# Patient Record
Sex: Male | Born: 1935 | Race: Black or African American | Hispanic: No | Marital: Married | Smoking: Former smoker
Health system: Southern US, Community
[De-identification: ages and names within clinical notes are randomized; demographics above are authoritative.]

## PROBLEM LIST (undated history)

## (undated) DIAGNOSIS — G4733 Obstructive sleep apnea (adult) (pediatric): Secondary | ICD-10-CM

## (undated) DIAGNOSIS — R05 Cough: Secondary | ICD-10-CM

## (undated) DIAGNOSIS — I10 Essential (primary) hypertension: Secondary | ICD-10-CM

## (undated) DIAGNOSIS — E785 Hyperlipidemia, unspecified: Secondary | ICD-10-CM

## (undated) DIAGNOSIS — R059 Cough, unspecified: Secondary | ICD-10-CM

## (undated) DIAGNOSIS — M1990 Unspecified osteoarthritis, unspecified site: Secondary | ICD-10-CM

## (undated) DIAGNOSIS — D649 Anemia, unspecified: Secondary | ICD-10-CM

## (undated) DIAGNOSIS — R600 Localized edema: Secondary | ICD-10-CM

## (undated) HISTORY — DX: Cough: R05

## (undated) HISTORY — DX: Hyperlipidemia, unspecified: E78.5

## (undated) HISTORY — DX: Anemia, unspecified: D64.9

## (undated) HISTORY — PX: COLONOSCOPY W/ POLYPECTOMY: SHX1380

## (undated) HISTORY — PX: OTHER SURGICAL HISTORY: SHX169

## (undated) HISTORY — DX: Obstructive sleep apnea (adult) (pediatric): G47.33

## (undated) HISTORY — DX: Cough, unspecified: R05.9

---

## 2002-02-22 ENCOUNTER — Emergency Department (HOSPITAL_COMMUNITY): Admission: EM | Admit: 2002-02-22 | Discharge: 2002-02-22 | Payer: Self-pay | Admitting: Emergency Medicine

## 2002-02-22 ENCOUNTER — Encounter: Payer: Self-pay | Admitting: Emergency Medicine

## 2002-05-03 ENCOUNTER — Encounter: Payer: Self-pay | Admitting: Internal Medicine

## 2002-07-15 ENCOUNTER — Encounter: Payer: Self-pay | Admitting: Internal Medicine

## 2004-07-06 ENCOUNTER — Ambulatory Visit: Payer: Self-pay | Admitting: Internal Medicine

## 2004-07-13 ENCOUNTER — Ambulatory Visit: Payer: Self-pay | Admitting: Internal Medicine

## 2004-07-19 ENCOUNTER — Ambulatory Visit: Payer: Self-pay | Admitting: Internal Medicine

## 2004-07-26 ENCOUNTER — Ambulatory Visit: Payer: Self-pay | Admitting: Internal Medicine

## 2004-08-03 ENCOUNTER — Ambulatory Visit: Payer: Self-pay | Admitting: Internal Medicine

## 2004-08-09 ENCOUNTER — Ambulatory Visit: Payer: Self-pay | Admitting: Internal Medicine

## 2004-08-23 ENCOUNTER — Ambulatory Visit: Payer: Self-pay | Admitting: Internal Medicine

## 2004-08-30 ENCOUNTER — Ambulatory Visit: Payer: Self-pay | Admitting: Internal Medicine

## 2004-09-07 ENCOUNTER — Ambulatory Visit: Payer: Self-pay | Admitting: Internal Medicine

## 2004-09-10 ENCOUNTER — Ambulatory Visit: Payer: Self-pay | Admitting: Internal Medicine

## 2004-09-21 ENCOUNTER — Ambulatory Visit: Payer: Self-pay | Admitting: Internal Medicine

## 2004-09-25 ENCOUNTER — Ambulatory Visit: Payer: Self-pay | Admitting: Internal Medicine

## 2004-10-04 ENCOUNTER — Ambulatory Visit: Payer: Self-pay | Admitting: Internal Medicine

## 2004-10-15 ENCOUNTER — Ambulatory Visit: Payer: Self-pay | Admitting: Internal Medicine

## 2004-10-23 ENCOUNTER — Ambulatory Visit: Payer: Self-pay | Admitting: Internal Medicine

## 2004-10-30 ENCOUNTER — Ambulatory Visit: Payer: Self-pay | Admitting: Internal Medicine

## 2004-11-08 ENCOUNTER — Ambulatory Visit: Payer: Self-pay | Admitting: Internal Medicine

## 2004-11-13 ENCOUNTER — Ambulatory Visit: Payer: Self-pay | Admitting: Internal Medicine

## 2004-11-23 ENCOUNTER — Ambulatory Visit: Payer: Self-pay | Admitting: Internal Medicine

## 2004-11-30 ENCOUNTER — Ambulatory Visit: Payer: Self-pay | Admitting: Internal Medicine

## 2004-12-07 ENCOUNTER — Ambulatory Visit: Payer: Self-pay | Admitting: Internal Medicine

## 2004-12-13 ENCOUNTER — Ambulatory Visit: Payer: Self-pay | Admitting: Internal Medicine

## 2004-12-21 ENCOUNTER — Ambulatory Visit: Payer: Self-pay | Admitting: Internal Medicine

## 2004-12-26 ENCOUNTER — Ambulatory Visit: Payer: Self-pay | Admitting: Internal Medicine

## 2005-01-02 ENCOUNTER — Ambulatory Visit: Payer: Self-pay | Admitting: Internal Medicine

## 2005-01-10 ENCOUNTER — Ambulatory Visit: Payer: Self-pay | Admitting: Internal Medicine

## 2005-01-16 ENCOUNTER — Ambulatory Visit: Payer: Self-pay | Admitting: Internal Medicine

## 2005-01-22 ENCOUNTER — Ambulatory Visit: Payer: Self-pay | Admitting: Internal Medicine

## 2005-01-29 ENCOUNTER — Ambulatory Visit: Payer: Self-pay | Admitting: Internal Medicine

## 2005-02-01 ENCOUNTER — Ambulatory Visit: Payer: Self-pay | Admitting: Internal Medicine

## 2005-02-07 ENCOUNTER — Ambulatory Visit: Payer: Self-pay | Admitting: Internal Medicine

## 2005-02-13 ENCOUNTER — Ambulatory Visit: Payer: Self-pay | Admitting: Internal Medicine

## 2005-02-21 ENCOUNTER — Ambulatory Visit: Payer: Self-pay | Admitting: Internal Medicine

## 2005-02-21 ENCOUNTER — Emergency Department (HOSPITAL_COMMUNITY): Admission: EM | Admit: 2005-02-21 | Discharge: 2005-02-22 | Payer: Self-pay | Admitting: *Deleted

## 2005-03-01 ENCOUNTER — Ambulatory Visit: Payer: Self-pay | Admitting: Internal Medicine

## 2005-03-08 ENCOUNTER — Ambulatory Visit: Payer: Self-pay | Admitting: Internal Medicine

## 2005-03-15 ENCOUNTER — Ambulatory Visit: Payer: Self-pay | Admitting: Internal Medicine

## 2005-03-26 ENCOUNTER — Ambulatory Visit: Payer: Self-pay | Admitting: Internal Medicine

## 2005-04-09 ENCOUNTER — Ambulatory Visit: Payer: Self-pay | Admitting: Internal Medicine

## 2005-04-18 ENCOUNTER — Ambulatory Visit: Payer: Self-pay | Admitting: Internal Medicine

## 2005-05-02 ENCOUNTER — Ambulatory Visit: Payer: Self-pay | Admitting: Internal Medicine

## 2005-05-14 ENCOUNTER — Ambulatory Visit: Payer: Self-pay | Admitting: Internal Medicine

## 2005-05-30 ENCOUNTER — Ambulatory Visit: Payer: Self-pay | Admitting: Internal Medicine

## 2005-06-04 ENCOUNTER — Ambulatory Visit: Payer: Self-pay | Admitting: Internal Medicine

## 2005-06-14 ENCOUNTER — Ambulatory Visit: Payer: Self-pay | Admitting: Internal Medicine

## 2005-06-20 ENCOUNTER — Ambulatory Visit: Payer: Self-pay | Admitting: Internal Medicine

## 2005-06-26 ENCOUNTER — Ambulatory Visit: Payer: Self-pay | Admitting: Internal Medicine

## 2005-06-27 ENCOUNTER — Ambulatory Visit: Payer: Self-pay | Admitting: Internal Medicine

## 2005-07-05 ENCOUNTER — Ambulatory Visit: Payer: Self-pay | Admitting: Internal Medicine

## 2005-07-12 ENCOUNTER — Ambulatory Visit: Payer: Self-pay | Admitting: Internal Medicine

## 2005-07-22 ENCOUNTER — Ambulatory Visit: Payer: Self-pay | Admitting: Internal Medicine

## 2005-07-30 ENCOUNTER — Ambulatory Visit: Payer: Self-pay | Admitting: Internal Medicine

## 2005-07-31 ENCOUNTER — Ambulatory Visit: Payer: Self-pay | Admitting: Internal Medicine

## 2005-08-06 DIAGNOSIS — Z86010 Personal history of colonic polyps: Secondary | ICD-10-CM | POA: Insufficient documentation

## 2005-08-09 ENCOUNTER — Ambulatory Visit: Payer: Self-pay | Admitting: Internal Medicine

## 2005-08-16 ENCOUNTER — Ambulatory Visit: Payer: Self-pay | Admitting: Internal Medicine

## 2005-08-27 ENCOUNTER — Ambulatory Visit: Payer: Self-pay | Admitting: Internal Medicine

## 2005-09-05 ENCOUNTER — Ambulatory Visit: Payer: Self-pay | Admitting: Internal Medicine

## 2005-09-18 ENCOUNTER — Ambulatory Visit: Payer: Self-pay | Admitting: Internal Medicine

## 2005-09-27 ENCOUNTER — Ambulatory Visit: Payer: Self-pay | Admitting: Internal Medicine

## 2005-10-01 ENCOUNTER — Ambulatory Visit: Payer: Self-pay | Admitting: Internal Medicine

## 2005-10-10 ENCOUNTER — Ambulatory Visit: Payer: Self-pay | Admitting: Internal Medicine

## 2005-10-14 ENCOUNTER — Ambulatory Visit: Payer: Self-pay | Admitting: Internal Medicine

## 2005-10-25 ENCOUNTER — Ambulatory Visit: Payer: Self-pay | Admitting: Internal Medicine

## 2005-10-31 ENCOUNTER — Ambulatory Visit: Payer: Self-pay | Admitting: Internal Medicine

## 2005-11-14 ENCOUNTER — Ambulatory Visit: Payer: Self-pay | Admitting: Internal Medicine

## 2005-11-21 ENCOUNTER — Ambulatory Visit: Payer: Self-pay | Admitting: Internal Medicine

## 2005-11-26 ENCOUNTER — Ambulatory Visit: Payer: Self-pay | Admitting: Internal Medicine

## 2005-11-27 ENCOUNTER — Ambulatory Visit: Payer: Self-pay | Admitting: Internal Medicine

## 2005-12-02 ENCOUNTER — Ambulatory Visit: Payer: Self-pay | Admitting: Internal Medicine

## 2005-12-13 ENCOUNTER — Ambulatory Visit: Payer: Self-pay | Admitting: Internal Medicine

## 2005-12-19 ENCOUNTER — Ambulatory Visit: Payer: Self-pay | Admitting: Internal Medicine

## 2005-12-27 ENCOUNTER — Ambulatory Visit: Payer: Self-pay | Admitting: Internal Medicine

## 2005-12-30 ENCOUNTER — Ambulatory Visit: Payer: Self-pay | Admitting: Internal Medicine

## 2006-01-08 ENCOUNTER — Ambulatory Visit: Payer: Self-pay | Admitting: Internal Medicine

## 2006-01-14 ENCOUNTER — Ambulatory Visit: Payer: Self-pay | Admitting: Internal Medicine

## 2006-01-24 ENCOUNTER — Ambulatory Visit: Payer: Self-pay | Admitting: Internal Medicine

## 2006-01-30 ENCOUNTER — Ambulatory Visit: Payer: Self-pay | Admitting: Internal Medicine

## 2006-02-06 ENCOUNTER — Ambulatory Visit: Payer: Self-pay | Admitting: Internal Medicine

## 2006-02-13 ENCOUNTER — Ambulatory Visit: Payer: Self-pay | Admitting: Internal Medicine

## 2006-02-20 ENCOUNTER — Ambulatory Visit: Payer: Self-pay | Admitting: Internal Medicine

## 2006-02-27 ENCOUNTER — Ambulatory Visit: Payer: Self-pay | Admitting: Internal Medicine

## 2006-03-04 ENCOUNTER — Ambulatory Visit: Payer: Self-pay | Admitting: Internal Medicine

## 2006-03-20 ENCOUNTER — Ambulatory Visit: Payer: Self-pay | Admitting: Internal Medicine

## 2006-03-28 ENCOUNTER — Ambulatory Visit: Payer: Self-pay | Admitting: Internal Medicine

## 2006-04-10 ENCOUNTER — Ambulatory Visit: Payer: Self-pay | Admitting: Internal Medicine

## 2006-04-11 ENCOUNTER — Ambulatory Visit: Payer: Self-pay | Admitting: Internal Medicine

## 2006-04-17 ENCOUNTER — Ambulatory Visit: Payer: Self-pay | Admitting: Internal Medicine

## 2006-04-23 ENCOUNTER — Ambulatory Visit: Payer: Self-pay | Admitting: Pulmonary Disease

## 2006-04-23 ENCOUNTER — Ambulatory Visit: Payer: Self-pay | Admitting: Internal Medicine

## 2006-05-07 ENCOUNTER — Ambulatory Visit: Payer: Self-pay | Admitting: Internal Medicine

## 2006-05-13 ENCOUNTER — Ambulatory Visit: Payer: Self-pay | Admitting: Internal Medicine

## 2006-05-29 ENCOUNTER — Ambulatory Visit: Payer: Self-pay | Admitting: Internal Medicine

## 2006-06-04 ENCOUNTER — Ambulatory Visit: Payer: Self-pay | Admitting: Internal Medicine

## 2006-06-18 ENCOUNTER — Ambulatory Visit: Payer: Self-pay | Admitting: Internal Medicine

## 2006-07-04 ENCOUNTER — Ambulatory Visit: Payer: Self-pay | Admitting: Internal Medicine

## 2006-07-11 ENCOUNTER — Ambulatory Visit: Payer: Self-pay | Admitting: Internal Medicine

## 2006-07-25 ENCOUNTER — Ambulatory Visit: Payer: Self-pay | Admitting: Internal Medicine

## 2006-07-31 ENCOUNTER — Ambulatory Visit: Payer: Self-pay | Admitting: Internal Medicine

## 2006-08-07 ENCOUNTER — Ambulatory Visit: Payer: Self-pay | Admitting: Internal Medicine

## 2006-08-14 ENCOUNTER — Ambulatory Visit: Payer: Self-pay | Admitting: Internal Medicine

## 2006-08-21 ENCOUNTER — Ambulatory Visit: Payer: Self-pay | Admitting: Internal Medicine

## 2006-09-01 ENCOUNTER — Ambulatory Visit: Payer: Self-pay | Admitting: Internal Medicine

## 2006-09-08 ENCOUNTER — Ambulatory Visit: Payer: Self-pay | Admitting: Internal Medicine

## 2006-09-25 ENCOUNTER — Ambulatory Visit: Payer: Self-pay | Admitting: Internal Medicine

## 2006-09-30 ENCOUNTER — Ambulatory Visit: Payer: Self-pay | Admitting: Internal Medicine

## 2006-10-01 ENCOUNTER — Ambulatory Visit: Payer: Self-pay | Admitting: Internal Medicine

## 2006-10-09 ENCOUNTER — Ambulatory Visit: Payer: Self-pay | Admitting: Internal Medicine

## 2006-10-16 ENCOUNTER — Ambulatory Visit: Payer: Self-pay | Admitting: Internal Medicine

## 2006-10-22 ENCOUNTER — Encounter: Admission: RE | Admit: 2006-10-22 | Discharge: 2006-10-22 | Payer: Self-pay | Admitting: Internal Medicine

## 2006-10-22 ENCOUNTER — Ambulatory Visit: Payer: Self-pay | Admitting: Internal Medicine

## 2006-10-29 ENCOUNTER — Ambulatory Visit: Payer: Self-pay | Admitting: Internal Medicine

## 2006-11-05 ENCOUNTER — Ambulatory Visit: Payer: Self-pay | Admitting: Internal Medicine

## 2006-11-12 ENCOUNTER — Ambulatory Visit: Payer: Self-pay | Admitting: Internal Medicine

## 2006-11-18 ENCOUNTER — Ambulatory Visit: Payer: Self-pay | Admitting: Internal Medicine

## 2006-11-27 ENCOUNTER — Ambulatory Visit: Payer: Self-pay | Admitting: Internal Medicine

## 2006-12-04 ENCOUNTER — Ambulatory Visit: Payer: Self-pay | Admitting: Internal Medicine

## 2006-12-15 ENCOUNTER — Ambulatory Visit: Payer: Self-pay | Admitting: Internal Medicine

## 2006-12-24 ENCOUNTER — Ambulatory Visit: Payer: Self-pay | Admitting: Internal Medicine

## 2007-01-06 ENCOUNTER — Ambulatory Visit: Payer: Self-pay | Admitting: Internal Medicine

## 2007-01-12 ENCOUNTER — Ambulatory Visit: Payer: Self-pay | Admitting: Internal Medicine

## 2007-01-21 ENCOUNTER — Ambulatory Visit: Payer: Self-pay | Admitting: Internal Medicine

## 2007-01-26 ENCOUNTER — Ambulatory Visit: Payer: Self-pay | Admitting: Internal Medicine

## 2007-02-04 ENCOUNTER — Ambulatory Visit: Payer: Self-pay | Admitting: Internal Medicine

## 2007-02-18 ENCOUNTER — Ambulatory Visit: Payer: Self-pay | Admitting: Internal Medicine

## 2007-02-25 ENCOUNTER — Ambulatory Visit: Payer: Self-pay | Admitting: Internal Medicine

## 2007-02-26 ENCOUNTER — Ambulatory Visit: Payer: Self-pay | Admitting: Internal Medicine

## 2007-03-06 ENCOUNTER — Ambulatory Visit: Payer: Self-pay | Admitting: Internal Medicine

## 2007-03-18 ENCOUNTER — Ambulatory Visit: Payer: Self-pay | Admitting: Internal Medicine

## 2007-03-25 ENCOUNTER — Ambulatory Visit: Payer: Self-pay | Admitting: Internal Medicine

## 2007-04-03 ENCOUNTER — Ambulatory Visit: Payer: Self-pay | Admitting: Internal Medicine

## 2007-04-13 ENCOUNTER — Ambulatory Visit: Payer: Self-pay | Admitting: Internal Medicine

## 2007-04-16 ENCOUNTER — Ambulatory Visit: Payer: Self-pay | Admitting: Pulmonary Disease

## 2007-04-16 DIAGNOSIS — J309 Allergic rhinitis, unspecified: Secondary | ICD-10-CM | POA: Insufficient documentation

## 2007-04-16 DIAGNOSIS — G4733 Obstructive sleep apnea (adult) (pediatric): Secondary | ICD-10-CM

## 2007-04-16 DIAGNOSIS — R05 Cough: Secondary | ICD-10-CM

## 2007-04-16 DIAGNOSIS — J45998 Other asthma: Secondary | ICD-10-CM | POA: Insufficient documentation

## 2007-04-23 ENCOUNTER — Ambulatory Visit: Payer: Self-pay | Admitting: Internal Medicine

## 2007-04-27 ENCOUNTER — Ambulatory Visit: Payer: Self-pay | Admitting: Internal Medicine

## 2007-05-13 ENCOUNTER — Ambulatory Visit: Payer: Self-pay | Admitting: Internal Medicine

## 2007-05-20 ENCOUNTER — Ambulatory Visit: Payer: Self-pay | Admitting: Internal Medicine

## 2007-06-03 ENCOUNTER — Ambulatory Visit: Payer: Self-pay | Admitting: Internal Medicine

## 2007-06-16 ENCOUNTER — Ambulatory Visit: Payer: Self-pay | Admitting: Internal Medicine

## 2007-06-23 ENCOUNTER — Ambulatory Visit: Payer: Self-pay | Admitting: Internal Medicine

## 2007-07-09 ENCOUNTER — Ambulatory Visit: Payer: Self-pay | Admitting: Internal Medicine

## 2007-07-16 ENCOUNTER — Ambulatory Visit: Payer: Self-pay | Admitting: Internal Medicine

## 2007-07-24 ENCOUNTER — Ambulatory Visit: Payer: Self-pay | Admitting: Internal Medicine

## 2007-08-07 ENCOUNTER — Ambulatory Visit: Payer: Self-pay | Admitting: Internal Medicine

## 2007-08-20 ENCOUNTER — Ambulatory Visit: Payer: Self-pay | Admitting: Internal Medicine

## 2007-09-01 ENCOUNTER — Ambulatory Visit: Payer: Self-pay | Admitting: Internal Medicine

## 2007-09-04 ENCOUNTER — Ambulatory Visit: Payer: Self-pay | Admitting: Internal Medicine

## 2007-09-18 ENCOUNTER — Ambulatory Visit: Payer: Self-pay | Admitting: Internal Medicine

## 2007-10-02 ENCOUNTER — Ambulatory Visit: Payer: Self-pay | Admitting: Internal Medicine

## 2007-10-13 ENCOUNTER — Ambulatory Visit: Payer: Self-pay | Admitting: Internal Medicine

## 2007-10-28 ENCOUNTER — Ambulatory Visit: Payer: Self-pay | Admitting: Internal Medicine

## 2007-11-05 ENCOUNTER — Ambulatory Visit: Payer: Self-pay | Admitting: Internal Medicine

## 2007-11-30 ENCOUNTER — Ambulatory Visit: Payer: Self-pay | Admitting: Internal Medicine

## 2007-12-16 ENCOUNTER — Ambulatory Visit: Payer: Self-pay | Admitting: Internal Medicine

## 2007-12-22 ENCOUNTER — Ambulatory Visit: Payer: Self-pay | Admitting: Internal Medicine

## 2008-01-05 ENCOUNTER — Ambulatory Visit: Payer: Self-pay | Admitting: Internal Medicine

## 2008-01-12 ENCOUNTER — Ambulatory Visit: Payer: Self-pay | Admitting: Internal Medicine

## 2008-01-21 ENCOUNTER — Ambulatory Visit: Payer: Self-pay | Admitting: Internal Medicine

## 2008-01-29 ENCOUNTER — Ambulatory Visit: Payer: Self-pay | Admitting: Internal Medicine

## 2008-02-08 ENCOUNTER — Ambulatory Visit: Payer: Self-pay | Admitting: Internal Medicine

## 2008-02-16 ENCOUNTER — Ambulatory Visit: Payer: Self-pay | Admitting: Internal Medicine

## 2008-02-25 ENCOUNTER — Ambulatory Visit: Payer: Self-pay | Admitting: Internal Medicine

## 2008-03-01 ENCOUNTER — Ambulatory Visit: Payer: Self-pay | Admitting: Internal Medicine

## 2008-03-02 ENCOUNTER — Ambulatory Visit: Payer: Self-pay | Admitting: Internal Medicine

## 2008-03-11 ENCOUNTER — Ambulatory Visit: Payer: Self-pay | Admitting: Internal Medicine

## 2008-03-16 ENCOUNTER — Ambulatory Visit: Payer: Self-pay | Admitting: Internal Medicine

## 2008-03-17 ENCOUNTER — Telehealth (INDEPENDENT_AMBULATORY_CARE_PROVIDER_SITE_OTHER): Payer: Self-pay | Admitting: *Deleted

## 2008-03-23 ENCOUNTER — Ambulatory Visit: Payer: Self-pay | Admitting: Internal Medicine

## 2008-03-31 ENCOUNTER — Ambulatory Visit: Payer: Self-pay | Admitting: Pulmonary Disease

## 2008-03-31 ENCOUNTER — Ambulatory Visit: Payer: Self-pay | Admitting: Internal Medicine

## 2008-04-11 ENCOUNTER — Encounter: Payer: Self-pay | Admitting: Internal Medicine

## 2008-04-12 ENCOUNTER — Ambulatory Visit: Payer: Self-pay | Admitting: Internal Medicine

## 2008-04-27 ENCOUNTER — Ambulatory Visit: Payer: Self-pay | Admitting: Internal Medicine

## 2008-05-03 ENCOUNTER — Ambulatory Visit: Payer: Self-pay | Admitting: Internal Medicine

## 2008-05-24 ENCOUNTER — Ambulatory Visit: Payer: Self-pay | Admitting: Internal Medicine

## 2008-05-31 ENCOUNTER — Ambulatory Visit: Payer: Self-pay | Admitting: Internal Medicine

## 2008-06-09 ENCOUNTER — Ambulatory Visit: Payer: Self-pay | Admitting: Internal Medicine

## 2008-06-16 ENCOUNTER — Ambulatory Visit: Payer: Self-pay | Admitting: Internal Medicine

## 2008-06-29 ENCOUNTER — Ambulatory Visit: Payer: Self-pay | Admitting: Internal Medicine

## 2008-07-07 ENCOUNTER — Ambulatory Visit: Payer: Self-pay | Admitting: Internal Medicine

## 2008-07-14 ENCOUNTER — Ambulatory Visit: Payer: Self-pay | Admitting: Internal Medicine

## 2008-07-21 ENCOUNTER — Ambulatory Visit: Payer: Self-pay | Admitting: Internal Medicine

## 2008-08-10 ENCOUNTER — Ambulatory Visit: Payer: Self-pay | Admitting: Internal Medicine

## 2008-08-11 ENCOUNTER — Ambulatory Visit: Payer: Self-pay | Admitting: Internal Medicine

## 2008-08-18 ENCOUNTER — Ambulatory Visit: Payer: Self-pay | Admitting: Internal Medicine

## 2008-08-19 ENCOUNTER — Encounter: Payer: Self-pay | Admitting: Internal Medicine

## 2008-09-02 ENCOUNTER — Ambulatory Visit: Payer: Self-pay | Admitting: Internal Medicine

## 2008-09-14 ENCOUNTER — Ambulatory Visit: Payer: Self-pay | Admitting: Internal Medicine

## 2008-09-16 ENCOUNTER — Ambulatory Visit: Payer: Self-pay | Admitting: Internal Medicine

## 2008-09-21 ENCOUNTER — Ambulatory Visit: Payer: Self-pay | Admitting: Internal Medicine

## 2008-09-28 ENCOUNTER — Ambulatory Visit: Payer: Self-pay | Admitting: Internal Medicine

## 2008-10-06 ENCOUNTER — Ambulatory Visit: Payer: Self-pay | Admitting: Internal Medicine

## 2008-10-10 ENCOUNTER — Ambulatory Visit: Payer: Self-pay | Admitting: Internal Medicine

## 2008-10-25 ENCOUNTER — Ambulatory Visit: Payer: Self-pay | Admitting: Internal Medicine

## 2008-11-03 ENCOUNTER — Ambulatory Visit: Payer: Self-pay | Admitting: Internal Medicine

## 2008-11-03 ENCOUNTER — Telehealth (INDEPENDENT_AMBULATORY_CARE_PROVIDER_SITE_OTHER): Payer: Self-pay | Admitting: *Deleted

## 2008-11-09 ENCOUNTER — Encounter: Payer: Self-pay | Admitting: Internal Medicine

## 2008-11-11 ENCOUNTER — Ambulatory Visit: Payer: Self-pay | Admitting: Internal Medicine

## 2008-11-18 ENCOUNTER — Ambulatory Visit: Payer: Self-pay | Admitting: Internal Medicine

## 2008-11-24 ENCOUNTER — Ambulatory Visit: Payer: Self-pay | Admitting: Internal Medicine

## 2008-12-01 ENCOUNTER — Ambulatory Visit: Payer: Self-pay | Admitting: Internal Medicine

## 2008-12-07 ENCOUNTER — Ambulatory Visit: Payer: Self-pay | Admitting: Internal Medicine

## 2008-12-13 ENCOUNTER — Ambulatory Visit: Payer: Self-pay | Admitting: Internal Medicine

## 2008-12-20 ENCOUNTER — Ambulatory Visit: Payer: Self-pay | Admitting: Internal Medicine

## 2008-12-27 ENCOUNTER — Ambulatory Visit: Payer: Self-pay | Admitting: Internal Medicine

## 2009-01-06 ENCOUNTER — Ambulatory Visit: Payer: Self-pay | Admitting: Internal Medicine

## 2009-01-10 ENCOUNTER — Ambulatory Visit: Payer: Self-pay | Admitting: Internal Medicine

## 2009-01-18 ENCOUNTER — Ambulatory Visit: Payer: Self-pay | Admitting: Internal Medicine

## 2009-01-27 ENCOUNTER — Ambulatory Visit: Payer: Self-pay | Admitting: Internal Medicine

## 2009-02-09 ENCOUNTER — Ambulatory Visit: Payer: Self-pay | Admitting: Internal Medicine

## 2009-02-17 ENCOUNTER — Ambulatory Visit: Payer: Self-pay | Admitting: Internal Medicine

## 2009-02-24 ENCOUNTER — Ambulatory Visit: Payer: Self-pay | Admitting: Internal Medicine

## 2009-03-03 ENCOUNTER — Ambulatory Visit: Payer: Self-pay | Admitting: Internal Medicine

## 2009-03-03 DIAGNOSIS — E559 Vitamin D deficiency, unspecified: Secondary | ICD-10-CM | POA: Insufficient documentation

## 2009-03-17 ENCOUNTER — Ambulatory Visit: Payer: Self-pay | Admitting: Internal Medicine

## 2009-03-23 ENCOUNTER — Ambulatory Visit: Payer: Self-pay | Admitting: Internal Medicine

## 2009-03-30 ENCOUNTER — Ambulatory Visit: Payer: Self-pay | Admitting: Internal Medicine

## 2009-04-05 ENCOUNTER — Ambulatory Visit: Payer: Self-pay | Admitting: Internal Medicine

## 2009-04-21 ENCOUNTER — Ambulatory Visit: Payer: Self-pay | Admitting: Internal Medicine

## 2009-04-26 ENCOUNTER — Ambulatory Visit: Payer: Self-pay | Admitting: Internal Medicine

## 2009-05-03 ENCOUNTER — Ambulatory Visit: Payer: Self-pay | Admitting: Internal Medicine

## 2009-05-12 ENCOUNTER — Ambulatory Visit: Payer: Self-pay | Admitting: Internal Medicine

## 2009-05-15 ENCOUNTER — Ambulatory Visit: Payer: Self-pay | Admitting: Internal Medicine

## 2009-05-18 ENCOUNTER — Ambulatory Visit: Payer: Self-pay | Admitting: Internal Medicine

## 2009-06-01 ENCOUNTER — Ambulatory Visit: Payer: Self-pay | Admitting: Internal Medicine

## 2009-06-09 ENCOUNTER — Ambulatory Visit: Payer: Self-pay | Admitting: Internal Medicine

## 2009-06-20 ENCOUNTER — Ambulatory Visit: Payer: Self-pay | Admitting: Internal Medicine

## 2009-07-05 ENCOUNTER — Ambulatory Visit: Payer: Self-pay | Admitting: Internal Medicine

## 2009-07-14 ENCOUNTER — Ambulatory Visit: Payer: Self-pay | Admitting: Internal Medicine

## 2009-07-17 ENCOUNTER — Ambulatory Visit: Payer: Self-pay | Admitting: Internal Medicine

## 2009-08-03 ENCOUNTER — Ambulatory Visit: Payer: Self-pay | Admitting: Internal Medicine

## 2009-08-11 ENCOUNTER — Ambulatory Visit: Payer: Self-pay | Admitting: Internal Medicine

## 2009-08-18 ENCOUNTER — Ambulatory Visit: Payer: Self-pay | Admitting: Internal Medicine

## 2009-08-25 ENCOUNTER — Ambulatory Visit: Payer: Self-pay | Admitting: Internal Medicine

## 2009-08-30 ENCOUNTER — Ambulatory Visit: Payer: Self-pay | Admitting: Internal Medicine

## 2009-09-07 ENCOUNTER — Ambulatory Visit: Payer: Self-pay | Admitting: Internal Medicine

## 2009-09-20 ENCOUNTER — Ambulatory Visit: Payer: Self-pay | Admitting: Internal Medicine

## 2009-10-05 ENCOUNTER — Ambulatory Visit: Payer: Self-pay | Admitting: Internal Medicine

## 2009-10-25 ENCOUNTER — Ambulatory Visit: Payer: Self-pay | Admitting: Internal Medicine

## 2009-11-10 ENCOUNTER — Ambulatory Visit: Payer: Self-pay | Admitting: Internal Medicine

## 2009-11-14 ENCOUNTER — Ambulatory Visit: Payer: Self-pay | Admitting: Internal Medicine

## 2009-11-15 ENCOUNTER — Ambulatory Visit: Payer: Self-pay | Admitting: Internal Medicine

## 2009-11-24 ENCOUNTER — Ambulatory Visit: Payer: Self-pay | Admitting: Internal Medicine

## 2009-11-28 ENCOUNTER — Ambulatory Visit: Payer: Self-pay | Admitting: Internal Medicine

## 2009-12-18 ENCOUNTER — Ambulatory Visit: Payer: Self-pay | Admitting: Internal Medicine

## 2009-12-25 ENCOUNTER — Ambulatory Visit: Payer: Self-pay | Admitting: Internal Medicine

## 2010-01-15 ENCOUNTER — Ambulatory Visit: Payer: Self-pay | Admitting: Internal Medicine

## 2010-01-30 ENCOUNTER — Ambulatory Visit: Payer: Self-pay | Admitting: Internal Medicine

## 2010-02-06 ENCOUNTER — Ambulatory Visit: Payer: Self-pay | Admitting: Internal Medicine

## 2010-03-08 ENCOUNTER — Ambulatory Visit: Payer: Self-pay | Admitting: Internal Medicine

## 2010-03-13 ENCOUNTER — Ambulatory Visit: Payer: Self-pay | Admitting: Internal Medicine

## 2010-03-22 ENCOUNTER — Ambulatory Visit: Payer: Self-pay | Admitting: Internal Medicine

## 2010-03-29 ENCOUNTER — Ambulatory Visit: Payer: Self-pay | Admitting: Internal Medicine

## 2010-04-17 ENCOUNTER — Ambulatory Visit: Payer: Self-pay | Admitting: Internal Medicine

## 2010-04-17 ENCOUNTER — Telehealth: Payer: Self-pay | Admitting: Internal Medicine

## 2010-04-25 ENCOUNTER — Ambulatory Visit: Payer: Self-pay | Admitting: Internal Medicine

## 2010-05-02 ENCOUNTER — Ambulatory Visit: Payer: Self-pay | Admitting: Internal Medicine

## 2010-05-11 ENCOUNTER — Ambulatory Visit: Payer: Self-pay | Admitting: Internal Medicine

## 2010-05-29 ENCOUNTER — Ambulatory Visit: Payer: Self-pay | Admitting: Internal Medicine

## 2010-06-19 ENCOUNTER — Ambulatory Visit: Payer: Self-pay | Admitting: Internal Medicine

## 2010-06-27 ENCOUNTER — Ambulatory Visit: Payer: Self-pay | Admitting: Internal Medicine

## 2010-07-05 ENCOUNTER — Ambulatory Visit: Payer: Self-pay | Admitting: Internal Medicine

## 2010-07-16 ENCOUNTER — Ambulatory Visit: Payer: Self-pay | Admitting: Internal Medicine

## 2010-07-20 ENCOUNTER — Ambulatory Visit: Payer: Self-pay | Admitting: Internal Medicine

## 2010-07-29 ENCOUNTER — Ambulatory Visit: Payer: Self-pay | Admitting: Internal Medicine

## 2010-07-31 NOTE — Assessment & Plan Note (Signed)
Summary: 6 months/apc   Primary Provider/Referring Provider:  Heath Gold  CC:  Follow up visit-asthma and allergies; no complaints..  History of Present Illness: 07/21/08 Cough, OSA, asthma, allergic rhinitis Continues compliant and dsatisfied with BiPAP. Continues Allergy immunotherapy. C/O 1 week dry cough, some wheeze. Denies fever, sore throat/ obvious infection. Wife has a similar pattern currently.  02-06-2009- Cough, asthma, OSA, allergic rhinitis Contiues allergy vaccine - doing well with no problems. Now 1 month aware nonprogressive cough lying down at night with some phlegm in throat- nothing comes out. Relieved by teaspoon of codliver oil. Asks to d/c Advair- too expensive.  July 17, 2009- Cough, asthma, allergic rhinitis Had persistent head congestion resolved with Vick's. Persistent cough, dry, without shortness of breath. Coughing through much of the winter and he hopes to get a breathing treatment.  He continues doing well with allergy shots.  He remains compliant with BIPAP 20/8 every night, all night, wihout significant daytime sleepiness.   January 15, 2010- Cough, asthma, allergic rhinitis, OSA Minor cough goes and comes. Nasonex and allergy shots  continue to help. He is not using Qvar, and denies wheeze. In the winter sometimes at night he may wheeze a little. He doesn't have a rescue inhaler now and we discussed advice to have access to one.  He follows at [facility] for primary care and meds.    Asthma History    Initial Asthma Severity Rating:    Age range: 12+ years    Symptoms: 0-2 days/week    Nighttime Awakenings: 0-2/month    Interferes w/ normal activity: no limitations    SABA use (not for EIB): 0-2 days/week    Asthma Severity Assessment: Intermittent   Preventive Screening-Counseling & Management  Alcohol-Tobacco     Smoking Status: quit     Year Started: 1959     Year Quit: 1960  Current Medications (verified): 1)  Simvastatin 80 Mg  Tabs  (Simvastatin) .... Take 1 Tablet By Mouth Once A Day 2)  Hydrochlorothiazide 25 Mg  Tabs (Hydrochlorothiazide) .... Take 1 Tablet By Mouth Once A Day 3)  Ibuprofen 200 Mg  Caps (Ibuprofen) .... As Needed 4)  Cozaar 25 Mg  Tabs (Losartan Potassium) .... Take 1 Tablet By Mouth Once A Day 5)  Nasonex 50 Mcg/act  Susp (Mometasone Furoate) .Marland Kitchen.. 1-2 Sprays Each Nostril Once Daily 6)  Bipap 20 / 8 Am Home Patient .... Setting At 7 7)  Allergy Vaccine 1:10 Gh .... Vaccine Once Weekly 8)  Qvar 80 Mcg/act Aers (Beclomethasone Dipropionate) .... 2 Puffs, Then Rinse, Once Daily 9)  Fish Oil 1000 Mg Caps (Omega-3 Fatty Acids) .... Take 1 By Mouth Once Daily  Allergies (verified): No Known Drug Allergies  Past History:  Past Medical History: Last updated: 01/21/2008 OBSTRUCTIVE SLEEP APNEA (ICD-327.23) COUGH (ICD-786.2) ASTHMA (ICD-493.90) ALLERGIC RHINITIS (ICD-477.9)  Past Surgical History: Last updated: 07/21/2008 None  Family History: Last updated: Feb 06, 2009 Father- died cancer Mother- died colon cancer  Social History: Last updated: 07/21/2008 Patient never smoked.  Quit ETOH  Risk Factors: Smoking Status: quit (01/15/2010)  Social History: Smoking Status:  quit  Review of Systems      See HPI  The patient denies shortness of breath with activity, shortness of breath at rest, productive cough, non-productive cough, coughing up blood, chest pain, irregular heartbeats, acid heartburn, indigestion, loss of appetite, weight change, abdominal pain, difficulty swallowing, sore throat, tooth/dental problems, headaches, nasal congestion/difficulty breathing through nose, and sneezing.    Vital Signs:  Patient  profile:   77 year old male Height:      66 inches Weight:      228 pounds BMI:     36.93 O2 Sat:      94 % on Room air Pulse rate:   72 / minute BP sitting:   116 / 60  (left arm) Cuff size:   large  Vitals Entered By: Clayborne Dana CMA (January 15, 2010 10:55 AM)  O2  Flow:  Room air CC: Follow up visit-asthma and allergies; no complaints.   Physical Exam  Additional Exam:  General: A/Ox3; pleasant and cooperative, NAD, obese, alert SKIN: no rash, lesions NODES: no lymphadenopathy HEENT: [place]/AT, EOM- WNL, Conjuctivae- clear, PERRLA, TM-WNL, Nose- clear, Throat- clear and wnl, Mallampati  IV NECK: Supple w/ fair ROM, JVD- none, normal carotid impulses w/o bruits Thyroid-  CHEST: Clear to P&A, diminished,no rales, wheeze or crackles heard. Mild dry cough HEART: RRR, no m/g/r heard ABDOMEN: Soft and nl;  [id number], nl pulses, no edema  NEURO: Grossly intact to observation      Impression & Recommendations:  Problem # 1:  OBSTRUCTIVE SLEEP APNEA (ICD-327.23)  Good compliance and control with his BiPAP still set at 20/8. weight loss would help.  Problem # 2:  ASTHMA (ICD-493.90) Tthis is mild, intermittent and seasonal. We discussed resumption of Qvar as the seasons turn. We are giviing him a rescue inhaler script, with education on use.  Medications Added to Medication List This Visit: 1)  Proair Hfa 108 (90 Base) Mcg/act Aers (Albuterol sulfate) .... 2 puffs four times a day as needed rescue inhlaer  Other Orders: Est. Patient Level III [id number])  Patient Instructions: 1)  Please schedule a follow-up appointment in 1 year. 2)  Call for help or scripts as needed. 3)  Continue your BIPAP qt 20/8 4)  Restart your Qvar as we get close to the time of year when you have breathing trouble. 5)  Script to hold for a "rescue" bronchodilator inhaler. Prescriptions: PROAIR HFA 108 (90 BASE) MCG/ACT AERS (ALBUTEROL SULFATE) 2 puffs four times a day as needed rescue inhlaer  #1 x prn   Entered and Authorized by:   Deneise Lever MD   Signed by:   Deneise Lever MD on 01/15/2010   Method used:   Print then Give to Patient   RxID:   [id number]

## 2010-07-31 NOTE — Assessment & Plan Note (Signed)
Summary: 6 months/apc   Primary Provider/Referring Provider:  Heath Gold  CC:  6 month follow up visit-cough-dry..  History of Present Illness:  01/21/08- 77 year old man returning for follow-up of allergic rhinitis, asthma, and obstructive sleep apnea.  He has continued using CPAP every night, but does not know the pressure.  He continues allergy vaccine here at 1:10, which is well tolerated.  He says it has helped him.  Recent increased cough and wheeze with no sputum, fever, purulent discharge or exposure.  He says he is sensitive to spices used in cooking.  07/21/08 Cough, OSA, asthma, allergic rhinitis Continues compliant and dsatisfied with BiPAP. Continues Allergy immunotherapy. C/O 1 week dry cough, some wheeze. Denies fever, sore throat/ obvious infection. Wife has a similar pattern currently.  2009/01/27- Cough, asthma, OSA, allergic rhinitis Contiues allergy vaccine - doing well with no problems. Now 1 month aware nonprogressive cough lying down at night with some phlegm in throat- nothing comes out. Relieved by teaspoon of codliver oil. Asks to d/c Advair- too expensive.  July 17, 2009- Cough, asthma, allergic rhinitis Had persistent head congestion resolved with Vick's. Persistent cough, dry, without shortness of breath. Coughing through much of the winter and he hopes to get a breathing treatment.  He continues doing well with allergy shots.  He remains compliant with BIPAP 20/8 every night, all night, wihout significant daytime sleepiness.    Current Medications (verified): 1)  Simvastatin 80 Mg  Tabs (Simvastatin) .... Take 1 Tablet By Mouth Once A Day 2)  Hydrochlorothiazide 25 Mg  Tabs (Hydrochlorothiazide) .... Take 1 Tablet By Mouth Once A Day 3)  Ibuprofen 200 Mg  Caps (Ibuprofen) .... As Needed 4)  Cozaar 25 Mg  Tabs (Losartan Potassium) .... Take 1 Tablet By Mouth Once A Day 5)  Nasonex 50 Mcg/act  Susp (Mometasone Furoate) .... As Needed 6)  Bipap 20 / 8 Am  Home Patient .... Setting At 7 7)  Allergy Vaccine 1:10 Gh .... Vaccine Once Weekly 8)  Qvar 80 Mcg/act Aers (Beclomethasone Dipropionate) .... 2 Puffs, Then Rinse, Once Daily 9)  Fish Oil 1000 Mg Caps (Omega-3 Fatty Acids) .... Take 1 By Mouth Once Daily  Allergies (verified): No Known Drug Allergies  Past History:  Past Medical History: Last updated: 01/21/2008 OBSTRUCTIVE SLEEP APNEA (ICD-327.23) COUGH (ICD-786.2) ASTHMA (ICD-493.90) ALLERGIC RHINITIS (ICD-477.9)  Past Surgical History: Last updated: 07/21/2008 None  Family History: Last updated: 2009-01-27 Father- died cancer Mother- died colon cancer  Social History: Last updated: 07/21/2008 Patient never smoked.  Quit ETOH  Risk Factors: Smoking Status: never (07/21/2008)  Review of Systems      See HPI       The patient complains of prolonged cough.  The patient denies anorexia, fever, weight loss, weight gain, vision loss, decreased hearing, hoarseness, chest pain, syncope, dyspnea on exertion, peripheral edema, headaches, hemoptysis, abdominal pain, and severe indigestion/heartburn.    Vital Signs:  Patient profile:   77 year old male Height:      66 inches Weight:      230 pounds BMI:     37.26 O2 Sat:      96 % on Room air Pulse rate:   76 / minute BP sitting:   112 / 64  (right arm) Cuff size:   regular  Vitals Entered By: Clayborne Dana CMA (July 17, 2009 10:41 AM)  O2 Flow:  Room air  Physical Exam  Additional Exam:  General: A/Ox3; pleasant and cooperative, NAD, obese, alert SKIN: no  rash, lesions NODES: no lymphadenopathy HEENT: [place]/AT, EOM- WNL, Conjuctivae- clear, PERRLA, TM-WNL, Nose- clear, Throat- clear and wnl, [place] IV NECK: Supple w/ fair ROM, JVD- none, normal carotid impulses w/o bruits Thyroid-  CHEST: Clear to P&A, diminished,no rales, wheeze or crackles heard. Mild dry cough HEART: RRR, no m/g/r heard ABDOMEN: Soft and nl;  [id number], nl pulses, no edema  NEURO:  Grossly intact to observation      Impression & Recommendations:  Problem # 1:  OBSTRUCTIVE SLEEP APNEA (ICD-327.23)  Good compliance and control. Weight loss would help and is recommended.  Problem # 2:  ALLERGIC RHINITIS (ICD-477.9)  He will continue allergy vaccine and present meds. His updated medication list for this problem includes:    Nasonex 50 Mcg/act Susp (Mometasone furoate) .Marland Kitchen... 1-2 sprays each nostril once daily  Problem # 3:  ASTHMA (ICD-493.90)  Residual bronchitis, suggestive of either very mild asthma or a prolonged post-viral syndrome. We will give neb and depo, which he expects to work. If not, we will consider CXR. His updated medication list for this problem includes:    Nasonex 50 Mcg/act Susp (Mometasone furoate) .Marland Kitchen... 1-2 sprays each nostril once daily  Orders: Est. Patient Level III [id number])  Medications Added to Medication List This Visit: 1)  Nasonex 50 Mcg/act Susp (Mometasone furoate) .Marland Kitchen.. 1-2 sprays each nostril once daily 2)  Fish Oil 1000 Mg Caps (Omega-3 fatty acids) .... Take 1 by mouth once daily  Other Orders: Admin of Therapeutic Inj  intramuscular or subcutaneous [id number]) Depo- Medrol 80mg  ([id number]) Nebulizer Tx [id number])  Patient Instructions: 1)  Please schedule a follow-up appointment in 6 months. 2)  neb 1.25 3)  depo 80 4)  Refill for Nasonex sent to your drug strore Prescriptions: NASONEX 50 MCG/ACT  SUSP (MOMETASONE FUROATE) 1-2 sprays each nostril once daily  #1 x prn   Entered and Authorized by:   Deneise Lever MD   Signed by:   Deneise Lever MD on 07/17/2009   Method used:   Electronically to        [facility] (retail)       [address] [place], [place]  [address]       Ph: [phone]       Fax: [phone]   [id number]      Medication Administration  Injection # 1:    Medication: Depo- Medrol 80mg     Diagnosis: ASTHMA (ICD-493.90)    Route: IM    Site: R  deltoid    Exp Date: 03/2010    Lot #: [id number]    Mfr: Teva    Patient tolerated injection without complications    Given by: Blair Hailey  Medication # 1:    Medication: Xopenex 1.25mg     Diagnosis: ASTHMA (ICD-493.90)    Dose: 1 vial    Route: inhaled    Exp Date: 03/2010    Lot #: [id number]    Mfr: Sepracor    Patient tolerated medication without complications    Given by: Blair Hailey  Orders Added: 1)  Est. Patient Level III [id number] 2)  Admin of Therapeutic Inj  intramuscular or subcutaneous [[id number]] 3)  Depo- Medrol 80mg  [J1040] 4)  Nebulizer Tx [id number]

## 2010-07-31 NOTE — Miscellaneous (Signed)
Summary: Injection record/[facility]  Injection record/[facility]   Imported By: Phillis Knack 11/21/2009 13:12:56  _____________________________________________________________________  External Attachment:    Type:   Image     Comment:   External Document

## 2010-07-31 NOTE — Miscellaneous (Signed)
Summary: Injection Record / [facility]    Injection Record / [facility]    Imported By: Rise Patience 03/21/2010 13:49:38  _____________________________________________________________________  External Attachment:    Type:   Image     Comment:   External Document

## 2010-07-31 NOTE — Assessment & Plan Note (Signed)
Summary: asthma//jrc   Primary Provider/Referring Provider:  Heath Gold  CC:  Acute visit-asthma flare up-wheezing, cough(non productive), and chest tightness x 1 week.Christopher Shepherd  History of Present Illness: July 17, 2009- Cough, asthma, allergic rhinitis Had persistent head congestion resolved with Vick's. Persistent cough, dry, without shortness of breath. Coughing through much of the winter and he hopes to get a breathing treatment.  He continues doing well with allergy shots.  He remains compliant with BIPAP 20/8 every night, all night, wihout significant daytime sleepiness.   January 15, 2010- Cough, asthma, allergic rhinitis, OSA Minor cough goes and comes. Nasonex and allergy shots  continue to help. He is not using Qvar, and denies wheeze. In the winter sometimes at night he may wheeze a little. He doesn't have a rescue inhaler now and we discussed advice to have access to one.  He follows at [facility] for primary care and meds.  April 17, 2010- Cough, asthma, allergic rhinitis, OSA Nurse CC: Acute visit-asthma flare up-wheezing, cough(non productive), chest tightness x 1 week. Since Fall weather has had persistent dry cough, some wheeze at night, maybe some nasal drip. Denies infection. He thinks a nasal treatment usually clears him up.  Using  BiPAP each night set on 7. ? 20/8 Uses Qvar, but admits not using Proair much at all.   Asthma History    Asthma Control Assessment:    Age range: 12+ years    Symptoms: 0-2 days/week    Nighttime Awakenings: 0-2/month    Interferes w/ normal activity: no limitations    SABA use (not for EIB): 0-2 days/week    Asthma Control Assessment: Well Controlled   Preventive Screening-Counseling & Management  Alcohol-Tobacco     Smoking Status: quit     Packs/Day: <0.25     Year Started: 1959     Year Quit: 1960  Current Medications (verified): 1)  Simvastatin 80 Mg  Tabs (Simvastatin) .... Take 1 Tablet By Mouth Once A Day 2)   Hydrochlorothiazide 25 Mg  Tabs (Hydrochlorothiazide) .... Take 1 Tablet By Mouth Once A Day 3)  Ibuprofen 200 Mg  Caps (Ibuprofen) .... As Needed 4)  Cozaar 25 Mg  Tabs (Losartan Potassium) .... Take 1 Tablet By Mouth Once A Day 5)  Nasonex 50 Mcg/act  Susp (Mometasone Furoate) .Christopher Shepherd.. 1-2 Sprays Each Nostril Once Daily 6)  Bipap 20 / 8 Am Home Patient .... Setting At 7 7)  Allergy Vaccine 1:10 Gh .... Vaccine Once Weekly 8)  Proair Hfa 108 (90 Base) Mcg/act Aers (Albuterol Sulfate) .... 2 Puffs Four Times A Day As Needed Rescue Inhlaer 9)  Qvar 80 Mcg/act Aers (Beclomethasone Dipropionate) .... 2 Puffs, Then Rinse, Once Daily 10)  Fish Oil 1000 Mg Caps (Omega-3 Fatty Acids) .... Take 1 By Mouth Once Daily  Allergies (verified): No Known Drug Allergies  Past History:  Past Medical History: Last updated: 01/21/2008 OBSTRUCTIVE SLEEP APNEA (ICD-327.23) COUGH (ICD-786.2) ASTHMA (ICD-493.90) ALLERGIC RHINITIS (ICD-477.9)  Past Surgical History: Last updated: 07/21/2008 None  Family History: Last updated: February 01, 2009 Father- died cancer Mother- died colon cancer  Social History: Last updated: 07/21/2008 Patient never smoked.  Quit ETOH  Risk Factors: Smoking Status: quit (04/17/2010) Packs/Day: <0.25 (04/17/2010)  Social History: Packs/Day:  <0.25  Review of Systems      See HPI       The patient complains of non-productive cough and nasal congestion/difficulty breathing through nose.  The patient denies shortness of breath with activity, shortness of breath at rest, productive cough,  coughing up blood, chest pain, irregular heartbeats, acid heartburn, indigestion, loss of appetite, weight change, abdominal pain, difficulty swallowing, sore throat, tooth/dental problems, headaches, sneezing, itching, rash, change in color of mucus, and fever.    Vital Signs:  Patient profile:   77 year old male Height:      66 inches Weight:      227.13 pounds BMI:     36.79 O2 Sat:       100 % on Room air Pulse rate:   78 / minute BP sitting:   114 / 62  (left arm) Cuff size:   large  Vitals Entered By: Clayborne Dana CMA (April 17, 2010 2:29 PM)  O2 Flow:  Room air CC: Acute visit-asthma flare up-wheezing, cough(non productive), chest tightness x 1 week.   Physical Exam  Additional Exam:  General: A/Ox3; pleasant and cooperative, NAD, obese, alert SKIN: no rash, lesions NODES: no lymphadenopathy HEENT: [place]/AT, EOM- WNL, Conjuctivae- clear, PERRLA, TM-WNL, Nose- clear, Throat- clear and wnl, Mallampati  IV NECK: Supple w/ fair ROM, JVD- none, normal carotid impulses w/o bruits Thyroid-  CHEST: Clear to P&A, diminished,no rales, wheeze or crackles heard. Not coughing now HEART: RRR, no m/g/r heard ABDOMEN: Soft and nl;  [id number], nl pulses, no edema  NEURO: Grossly intact to observation      Impression & Recommendations:  Problem # 1:  OBSTRUCTIVE SLEEP APNEA (ICD-327.23)  He is compliant with good control on his BiPAP. He thinks he is set on 7. We will double check that wth [facility]. Weight loss is encouraged.   Problem # 2:  COUGH (ICD-786.2)  He believes that a nasal treatment is usually what clears his cough, suggesting that it is a postnasal drip phenomenon, even though he isn't so much aware of that.  Has had flu vax.   Other Orders: Est. Patient Level III [id number]) Prescription Created Electronically [id number]) Admin of Therapeutic Inj  intramuscular or subcutaneous [id number]) Depo- Medrol 80mg  (J1040) Nebulizer Tx [id number]) DME Referral (DME)  Patient Instructions: 1)  Please schedule a follow-up appointment in 6 months. 2)  neb neo nasal 3)  depo 80 4)  script refill for Qvar sent to your drug store Prescriptions: QVAR 80 MCG/ACT AERS (BECLOMETHASONE DIPROPIONATE) 2 puffs, then rinse, once daily  #1 x prn   Entered and Authorized by:   Deneise Lever MD   Signed by:   Deneise Lever MD on 04/17/2010   Method used:   Electronically  to        [facility] (retail)       [address] [place], [place]  [address]       Ph: [phone]       Fax: [phone]   RxID:   [id number]      Medication Administration  Injection # 1:    Medication: Depo- Medrol 80mg     Diagnosis: COUGH (ICD-786.2)    Route: SQ    Site: RUOQ gluteus    Exp Date: 09/2012    Lot #: [id number]    Mfr: Pharmacia    Patient tolerated injection without complications    Given by: Alphia Kava  Medication # 1:    Medication: EMR miscellaneous medications    Diagnosis: COUGH (ICD-786.2)    Dose: 3 drops    Route: intranasal    Exp Date: 12/2010    Lot #: [id number]    Mfr:  bayer    Comments: Neo-Synephrine    Patient tolerated medication without complications    Given by: Alphia Kava  Orders Added: 1)  Est. Patient Level III [id number] 2)  Prescription Created Electronically [id number] 3)  Admin of Therapeutic Inj  intramuscular or subcutaneous [[id number]] 4)  Depo- Medrol 80mg  [J1040] 5)  Nebulizer Tx [id number])  DME Referral [DME]   Appended Document: asthma//jrc Verified by Diona Fanti Patient- BIPAP is 20/8.

## 2010-07-31 NOTE — Progress Notes (Signed)
Summary: asthma  Phone Note Call from Patient Call back at Home Phone [phone]   Caller: Patient Call For: young Summary of Call: pt c/o asthma. wants to see dr young this wk asap. call cell # first: [phone] or home # above Initial call taken by: Cooper Render, CNA,  April 17, 2010 12:16 PM  Follow-up for Phone Call        Pt c/o wheezing and chest tightness and pt request appt with CY.pt scheduled to see CY today at 2:15pm. Friant Bing CMA  April 17, 2010 12:25 PM

## 2010-08-02 DIAGNOSIS — J301 Allergic rhinitis due to pollen: Secondary | ICD-10-CM

## 2010-08-10 DIAGNOSIS — J301 Allergic rhinitis due to pollen: Secondary | ICD-10-CM

## 2010-08-22 ENCOUNTER — Encounter: Payer: Self-pay | Admitting: Internal Medicine

## 2010-08-22 ENCOUNTER — Ambulatory Visit (INDEPENDENT_AMBULATORY_CARE_PROVIDER_SITE_OTHER): Payer: Medicare Other

## 2010-08-22 DIAGNOSIS — J301 Allergic rhinitis due to pollen: Secondary | ICD-10-CM

## 2010-08-28 NOTE — Miscellaneous (Signed)
Summary: Injection Record / [facility]    Injection Record / [facility]    Imported By: Rise Patience 08/24/2010 12:30:35  _____________________________________________________________________  External Attachment:    Type:   Image     Comment:   External Document

## 2010-09-05 ENCOUNTER — Ambulatory Visit (INDEPENDENT_AMBULATORY_CARE_PROVIDER_SITE_OTHER): Payer: Medicare Other

## 2010-09-05 ENCOUNTER — Encounter: Payer: Self-pay | Admitting: Internal Medicine

## 2010-09-05 DIAGNOSIS — J301 Allergic rhinitis due to pollen: Secondary | ICD-10-CM | POA: Insufficient documentation

## 2010-09-11 NOTE — Assessment & Plan Note (Signed)
Summary: allergy/cb  Nurse Visit   Allergies: No Known Drug Allergies  Orders Added: 1)  Allergy Injection (1) [id number]

## 2010-09-12 ENCOUNTER — Encounter: Payer: Self-pay | Admitting: Internal Medicine

## 2010-09-12 ENCOUNTER — Ambulatory Visit (INDEPENDENT_AMBULATORY_CARE_PROVIDER_SITE_OTHER): Payer: Medicare Other

## 2010-09-12 DIAGNOSIS — J301 Allergic rhinitis due to pollen: Secondary | ICD-10-CM

## 2010-09-18 NOTE — Assessment & Plan Note (Signed)
Summary: allergy/cb  Nurse Visit   Allergies: No Known Drug Allergies  Orders Added: 1)  Allergy Injection (1) [id number]

## 2010-09-19 ENCOUNTER — Ambulatory Visit (INDEPENDENT_AMBULATORY_CARE_PROVIDER_SITE_OTHER): Payer: Medicare Other

## 2010-09-19 DIAGNOSIS — J301 Allergic rhinitis due to pollen: Secondary | ICD-10-CM

## 2010-10-04 ENCOUNTER — Ambulatory Visit (INDEPENDENT_AMBULATORY_CARE_PROVIDER_SITE_OTHER): Payer: Medicare Other

## 2010-10-04 DIAGNOSIS — J301 Allergic rhinitis due to pollen: Secondary | ICD-10-CM

## 2010-10-15 ENCOUNTER — Ambulatory Visit (INDEPENDENT_AMBULATORY_CARE_PROVIDER_SITE_OTHER): Payer: Medicare Other

## 2010-10-15 DIAGNOSIS — J309 Allergic rhinitis, unspecified: Secondary | ICD-10-CM

## 2010-11-05 ENCOUNTER — Ambulatory Visit (INDEPENDENT_AMBULATORY_CARE_PROVIDER_SITE_OTHER): Payer: Medicare Other

## 2010-11-05 DIAGNOSIS — J309 Allergic rhinitis, unspecified: Secondary | ICD-10-CM

## 2010-11-13 NOTE — Assessment & Plan Note (Signed)
[facility]                             PULMONARY OFFICE NOTE   Christopher Shepherd, Christopher Shepherd                     MRN:          [id number]  DATE:04/16/2007                            DOB:          [birth year]    HISTORY OF PRESENT ILLNESS:  The patient is a 77 year old African-  American male patient of Dr. Janee Morn who has a known history of allergic  rhinitis, asthma, and obstructive sleep apnea who presents today with a  progressively worsening cough and wheezing over the last week.  The  patient reports he has had an intermittent cough for the last several  weeks, but has been worse recently with intermittent wheezing,  especially at night.  The patient denies any purulent sputum, fever,  hemoptysis, overt reflux, or leg swelling, or orthopnea.   PAST MEDICAL HISTORY:  Reviewed.   CURRENT MEDICATIONS:  Reviewed.   PHYSICAL EXAMINATION:  The patient is a pleasant male in no acute  distress.  He is afebrile with stable vital signs.  O2 saturation is 98% on room  air.  HEENT:  Unremarkable.  NECK:  Supple without cervical adenopathy.  No JVD.  LUNGS:  The lung fields reveal faint expiratory wheezes.  CARDIAC:  Regular rate.  ABDOMEN:  Soft and obese.  EXTREMITIES:  Warm without any edema.   IMPRESSION AND PLAN:  Mild asthmatic flare.  The patient is to begin a  prednisone taper over the next week.  Mucinex DM twice daily.  Tussionex  #4 ounces 1/2 to 1 teaspoon every 12 hours as needed for cough.  The  patient aware of sedating effect.  The patient is to return back to Dr.  Annamaria Boots as scheduled or sooner if needed.  Influenza vaccine was given  today along with a Xopenex nebulizer treatment.      Rexene Edison, NP  Electronically Signed      Clinton D. Annamaria Boots, MD, Shade Flood, FACP  Electronically Signed   TP/MedQ  DD: 04/16/2007  DT: 04/17/2007  Job #: [id number]

## 2010-11-16 NOTE — Assessment & Plan Note (Signed)
[name]                             PULMONARY OFFICE NOTE   Christopher Shepherd, Christopher Shepherd                     MRN:          [id number]  DATE:05/29/2006                            DOB:          February 09, 1927    PULMONARY/ALLERGY FOLLOWUP   PROBLEMS:  1. Allergic rhinitis.  2. Asthma.  3. Obstructive sleep apnea.  4. Cough.   HISTORY:  In recent weeks he has had increased cough and phlegm when  lying supine, but he says the sputum is clear.  He does not recognize  reflux symptoms, and feet are not swelling.  Tussionex has been very  helpful for sparing occasional use.  He says he had a stress test, which  turned out well.  He continues with CPAP.  Spiriva was of some help, but  is not formulary at the [place].  He asks a prescription that he would use  intermittently as a way of reducing the cost out of pocket.   MEDICATIONS:  1. Simvastatin 80 mg.  2. Hydrochlorothiazide 25 mg.  3. Guaifenesin.  4. Ibuprofen.  5. Losartan.  6. Advair 250/50.  7. Allergy vaccine here at 1:10.  8. Potassium 20 mEq.  9. CPAP.   OBJECTIVE:  Weight 234 pounds, BP 148/80, pulse regular 83.  Room air  saturation 100%.  He is overweight, alert, and appears quite comfortable.  There are no  pressure marks on his face from CPAP mask.  No nasal congestion.  LUNGS:  Very clear.  HEART:  Regular without murmur.  No edema.   IMPRESSION:  Stable, except for some increase in his multifactorial  cough.  I think there are components of asthma, possibly recent viral  syndrome, and possibly some reflux, although that has not been proven.   PLAN:  We talked about management options, diagnostic evaluations  available for further exploration of his cough.  Care is a little  fragmented because he gets his primary care through the [place], but I cannot  tell that there is any acute need.  We mainly provide his allergy  vaccine  management, which he feels is helpful and wants to continue.  I  did give  prescription Spiriva once daily with medication talk.  Schedule return  here 1 year, earlier p.r.n.     Clinton D. Annamaria Boots, MD, [place], [place]  Electronically Signed    CDY/MedQ  DD: 05/31/2006  DT: 06/01/2006  Job #: [id number]   cc:   Royetta Crochet. Karlton Lemon, M.D.  Eden Lathe. Einar Gip, MD  Dr. Idamae Lusher

## 2010-11-27 ENCOUNTER — Ambulatory Visit (INDEPENDENT_AMBULATORY_CARE_PROVIDER_SITE_OTHER): Payer: Medicare Other

## 2010-11-27 DIAGNOSIS — J309 Allergic rhinitis, unspecified: Secondary | ICD-10-CM

## 2010-12-06 ENCOUNTER — Ambulatory Visit (INDEPENDENT_AMBULATORY_CARE_PROVIDER_SITE_OTHER): Payer: Medicare Other

## 2010-12-06 DIAGNOSIS — J309 Allergic rhinitis, unspecified: Secondary | ICD-10-CM

## 2010-12-25 ENCOUNTER — Ambulatory Visit (INDEPENDENT_AMBULATORY_CARE_PROVIDER_SITE_OTHER): Payer: Medicare Other

## 2010-12-25 DIAGNOSIS — J309 Allergic rhinitis, unspecified: Secondary | ICD-10-CM

## 2011-01-08 ENCOUNTER — Ambulatory Visit (INDEPENDENT_AMBULATORY_CARE_PROVIDER_SITE_OTHER): Payer: Medicare Other

## 2011-01-08 DIAGNOSIS — J309 Allergic rhinitis, unspecified: Secondary | ICD-10-CM

## 2011-02-14 ENCOUNTER — Ambulatory Visit (INDEPENDENT_AMBULATORY_CARE_PROVIDER_SITE_OTHER): Payer: Medicare Other

## 2011-02-14 DIAGNOSIS — J309 Allergic rhinitis, unspecified: Secondary | ICD-10-CM

## 2011-02-26 ENCOUNTER — Ambulatory Visit (INDEPENDENT_AMBULATORY_CARE_PROVIDER_SITE_OTHER): Payer: Medicare Other

## 2011-02-26 DIAGNOSIS — J309 Allergic rhinitis, unspecified: Secondary | ICD-10-CM

## 2011-03-20 ENCOUNTER — Encounter: Payer: Self-pay | Admitting: Pulmonary Disease

## 2011-03-20 ENCOUNTER — Ambulatory Visit (INDEPENDENT_AMBULATORY_CARE_PROVIDER_SITE_OTHER): Payer: Medicare Other

## 2011-03-20 DIAGNOSIS — J309 Allergic rhinitis, unspecified: Secondary | ICD-10-CM

## 2011-03-21 ENCOUNTER — Encounter: Payer: Self-pay | Admitting: Internal Medicine

## 2011-03-21 ENCOUNTER — Ambulatory Visit (INDEPENDENT_AMBULATORY_CARE_PROVIDER_SITE_OTHER): Payer: Medicare Other | Admitting: Internal Medicine

## 2011-03-21 VITALS — BP 124/78 | HR 70 | Ht 66.0 in | Wt 224.2 lb

## 2011-03-21 DIAGNOSIS — J45909 Unspecified asthma, uncomplicated: Secondary | ICD-10-CM

## 2011-03-21 DIAGNOSIS — J301 Allergic rhinitis due to pollen: Secondary | ICD-10-CM

## 2011-03-21 DIAGNOSIS — G4733 Obstructive sleep apnea (adult) (pediatric): Secondary | ICD-10-CM

## 2011-03-21 MED ORDER — BECLOMETHASONE DIPROPIONATE 80 MCG/ACT IN AERS: 2.0000 | INHALATION_SPRAY | Freq: Every day | RESPIRATORY_TRACT | Status: DC

## 2011-03-21 MED ORDER — ALBUTEROL SULFATE HFA 108 (90 BASE) MCG/ACT IN AERS: 2.0000 | INHALATION_SPRAY | RESPIRATORY_TRACT | Status: DC | PRN

## 2011-03-21 NOTE — Progress Notes (Signed)
Subjective:    Patient ID: Christopher Shepherd, male    DOB: [birth year], 77 y.o.   MRN: [id number]  HPI 03/21/11-31--year-old male never smoker followed for cough, asthma, allergic rhinitis, obstructive sleep apnea Last here October 18,2011 With season change he is beginning to wheeze a little at night and throat feels more congested. He denies nasal congestion sneezing or drainage and does not think he has a cold. He is out of inhalers. He continues allergy vaccine and believes that helps him. Dr. Karlton Lemon manages his BiPAP which he continues to use all night every night 20/8, for sleep apnea.  Review of Systems Constitutional:   No-   weight loss, night sweats, fevers, chills, fatigue, lassitude. HEENT:   No-  headaches, difficulty swallowing, tooth/dental problems, sore throat,       No-  sneezing, itching, ear ache,    +nasal congestion, post nasal drip,  CV:  No-   chest pain, orthopnea, PND, swelling in lower extremities, anasarca,  dizziness, palpitations Resp: No-   shortness of breath with exertion or at rest.              No-   productive cough,  No non-productive cough,  No-  coughing up of blood.              No-   change in color of mucus.  + wheezing.   Skin: No-   rash or lesions. GI:  No-   heartburn, indigestion, abdominal pain, nausea, vomiting, diarrhea,                 change in bowel habits, loss of appetite GU: No-   dysuria, change in color of urine, no urgency or frequency.  No- flank pain. MS:  No-   joint pain or swelling.  No- decreased range of motion.  No- back pain. Neuro- grossly normal to observation, Or:  Psych:  No- change in mood or affect. No depression or anxiety.  No memory loss.      Objective:   Physical Exam General- Alert, Oriented, Affect-appropriate, Distress- none acute; obese Skin- rash-none, lesions- none, excoriation- none Lymphadenopathy- none Head- atraumatic            Eyes- Gross vision intact, PERRLA, conjunctivae clear secretions         Ears- Hearing, canals-normal            Nose- Clear, no-Septal dev, mucus, polyps, erosion, perforation             Throat- Mallampati IV , mucosa dry , drainage- none, tonsils- atrophic Neck- flexible , trachea midline, no stridor , thyroid nl, carotid no bruit Chest - symmetrical excursion , unlabored           Heart/CV- RRR with skipped beats , no murmur , no gallop  , no rub, nl s1 s2                           - JVD- none , edema- none, stasis changes- none, varices- none           Lung- clear to P&A, wheeze- none, cough- none , dullness-none, rub- none           Chest wall-  Abd- tender-no, distended-no, bowel sounds-present, HSM- no Br/ Gen/ Rectal- Not done, not indicated Extrem- cyanosis- none, clubbing, none, atrophy- none, strength- nl Neuro- grossly intact to observation         Assessment & Plan:

## 2011-03-21 NOTE — Patient Instructions (Signed)
Scripts printed to resume the every day maintenance inhaler Qvar, and the rescue inhaler Proair to use if needed.  Don't forget to get your flu vaccine later this Fall  Please call as needed

## 2011-03-25 ENCOUNTER — Encounter: Payer: Self-pay | Admitting: Internal Medicine

## 2011-03-25 NOTE — Assessment & Plan Note (Signed)
He needs meds refilled but has been well controlled with rare need for his rescue inhaler.

## 2011-03-25 NOTE — Assessment & Plan Note (Signed)
He is satisfied with his BiPAP control, sleeps comfortably and seeks no changes. Management is mostly by Dr Heath Gold

## 2011-03-25 NOTE — Assessment & Plan Note (Signed)
He has been satisfied that allergy vaccine is made a difference for him and is satisfied to continue just as he is doing.

## 2011-03-28 ENCOUNTER — Ambulatory Visit (INDEPENDENT_AMBULATORY_CARE_PROVIDER_SITE_OTHER): Payer: Medicare Other

## 2011-03-28 DIAGNOSIS — J309 Allergic rhinitis, unspecified: Secondary | ICD-10-CM

## 2011-04-04 ENCOUNTER — Ambulatory Visit (INDEPENDENT_AMBULATORY_CARE_PROVIDER_SITE_OTHER): Payer: Medicare Other

## 2011-04-04 DIAGNOSIS — J309 Allergic rhinitis, unspecified: Secondary | ICD-10-CM

## 2011-04-12 ENCOUNTER — Ambulatory Visit (INDEPENDENT_AMBULATORY_CARE_PROVIDER_SITE_OTHER): Payer: Medicare Other

## 2011-04-12 DIAGNOSIS — J309 Allergic rhinitis, unspecified: Secondary | ICD-10-CM

## 2011-04-12 DIAGNOSIS — Z23 Encounter for immunization: Secondary | ICD-10-CM

## 2011-04-22 ENCOUNTER — Encounter: Payer: Self-pay | Admitting: Internal Medicine

## 2011-04-29 ENCOUNTER — Ambulatory Visit (INDEPENDENT_AMBULATORY_CARE_PROVIDER_SITE_OTHER): Payer: Medicare Other

## 2011-04-29 DIAGNOSIS — J309 Allergic rhinitis, unspecified: Secondary | ICD-10-CM

## 2011-05-08 ENCOUNTER — Ambulatory Visit (INDEPENDENT_AMBULATORY_CARE_PROVIDER_SITE_OTHER): Payer: Medicare Other

## 2011-05-08 DIAGNOSIS — J309 Allergic rhinitis, unspecified: Secondary | ICD-10-CM

## 2011-05-15 ENCOUNTER — Ambulatory Visit (INDEPENDENT_AMBULATORY_CARE_PROVIDER_SITE_OTHER): Payer: Medicare Other

## 2011-05-15 DIAGNOSIS — J309 Allergic rhinitis, unspecified: Secondary | ICD-10-CM

## 2011-05-20 ENCOUNTER — Ambulatory Visit: Payer: Medicare Other

## 2011-05-31 ENCOUNTER — Ambulatory Visit (INDEPENDENT_AMBULATORY_CARE_PROVIDER_SITE_OTHER): Payer: Medicare Other

## 2011-05-31 DIAGNOSIS — J309 Allergic rhinitis, unspecified: Secondary | ICD-10-CM

## 2011-06-04 ENCOUNTER — Ambulatory Visit (INDEPENDENT_AMBULATORY_CARE_PROVIDER_SITE_OTHER): Payer: Medicare Other

## 2011-06-04 DIAGNOSIS — J309 Allergic rhinitis, unspecified: Secondary | ICD-10-CM

## 2011-06-13 ENCOUNTER — Ambulatory Visit (INDEPENDENT_AMBULATORY_CARE_PROVIDER_SITE_OTHER): Payer: Medicare Other

## 2011-06-13 DIAGNOSIS — J309 Allergic rhinitis, unspecified: Secondary | ICD-10-CM

## 2011-07-05 ENCOUNTER — Ambulatory Visit (INDEPENDENT_AMBULATORY_CARE_PROVIDER_SITE_OTHER): Payer: Medicare Other

## 2011-07-05 DIAGNOSIS — J309 Allergic rhinitis, unspecified: Secondary | ICD-10-CM

## 2011-07-10 ENCOUNTER — Ambulatory Visit (INDEPENDENT_AMBULATORY_CARE_PROVIDER_SITE_OTHER): Payer: Medicare Other

## 2011-07-10 DIAGNOSIS — J309 Allergic rhinitis, unspecified: Secondary | ICD-10-CM

## 2011-07-25 ENCOUNTER — Ambulatory Visit (INDEPENDENT_AMBULATORY_CARE_PROVIDER_SITE_OTHER): Payer: Medicare Other

## 2011-07-25 DIAGNOSIS — J309 Allergic rhinitis, unspecified: Secondary | ICD-10-CM

## 2011-08-02 ENCOUNTER — Ambulatory Visit (INDEPENDENT_AMBULATORY_CARE_PROVIDER_SITE_OTHER): Payer: Medicare Other

## 2011-08-02 DIAGNOSIS — J309 Allergic rhinitis, unspecified: Secondary | ICD-10-CM

## 2011-08-09 ENCOUNTER — Ambulatory Visit (INDEPENDENT_AMBULATORY_CARE_PROVIDER_SITE_OTHER): Payer: Medicare Other

## 2011-08-09 DIAGNOSIS — J309 Allergic rhinitis, unspecified: Secondary | ICD-10-CM

## 2011-08-21 ENCOUNTER — Ambulatory Visit (INDEPENDENT_AMBULATORY_CARE_PROVIDER_SITE_OTHER): Payer: Medicare Other

## 2011-08-21 DIAGNOSIS — J309 Allergic rhinitis, unspecified: Secondary | ICD-10-CM

## 2011-08-27 ENCOUNTER — Encounter: Payer: Self-pay | Admitting: Internal Medicine

## 2011-09-02 ENCOUNTER — Ambulatory Visit (INDEPENDENT_AMBULATORY_CARE_PROVIDER_SITE_OTHER): Payer: Medicare Other

## 2011-09-02 DIAGNOSIS — J309 Allergic rhinitis, unspecified: Secondary | ICD-10-CM

## 2011-09-11 ENCOUNTER — Ambulatory Visit (INDEPENDENT_AMBULATORY_CARE_PROVIDER_SITE_OTHER): Payer: Medicare Other

## 2011-09-11 DIAGNOSIS — J309 Allergic rhinitis, unspecified: Secondary | ICD-10-CM

## 2011-09-13 ENCOUNTER — Ambulatory Visit (INDEPENDENT_AMBULATORY_CARE_PROVIDER_SITE_OTHER): Payer: Medicare Other

## 2011-09-13 DIAGNOSIS — J309 Allergic rhinitis, unspecified: Secondary | ICD-10-CM

## 2011-10-01 ENCOUNTER — Ambulatory Visit (INDEPENDENT_AMBULATORY_CARE_PROVIDER_SITE_OTHER): Payer: Medicare Other

## 2011-10-01 DIAGNOSIS — J309 Allergic rhinitis, unspecified: Secondary | ICD-10-CM

## 2011-10-11 ENCOUNTER — Ambulatory Visit (INDEPENDENT_AMBULATORY_CARE_PROVIDER_SITE_OTHER): Payer: Medicare Other

## 2011-10-11 DIAGNOSIS — J309 Allergic rhinitis, unspecified: Secondary | ICD-10-CM

## 2011-10-15 ENCOUNTER — Ambulatory Visit (INDEPENDENT_AMBULATORY_CARE_PROVIDER_SITE_OTHER): Payer: Medicare Other

## 2011-10-15 DIAGNOSIS — J309 Allergic rhinitis, unspecified: Secondary | ICD-10-CM

## 2011-10-22 ENCOUNTER — Ambulatory Visit (INDEPENDENT_AMBULATORY_CARE_PROVIDER_SITE_OTHER): Payer: Medicare Other

## 2011-10-22 DIAGNOSIS — J309 Allergic rhinitis, unspecified: Secondary | ICD-10-CM

## 2011-10-28 ENCOUNTER — Ambulatory Visit (INDEPENDENT_AMBULATORY_CARE_PROVIDER_SITE_OTHER): Payer: Medicare Other

## 2011-10-28 DIAGNOSIS — J309 Allergic rhinitis, unspecified: Secondary | ICD-10-CM

## 2011-11-14 ENCOUNTER — Ambulatory Visit (INDEPENDENT_AMBULATORY_CARE_PROVIDER_SITE_OTHER): Payer: Medicare Other

## 2011-11-14 DIAGNOSIS — J309 Allergic rhinitis, unspecified: Secondary | ICD-10-CM

## 2011-11-26 ENCOUNTER — Ambulatory Visit (INDEPENDENT_AMBULATORY_CARE_PROVIDER_SITE_OTHER): Payer: Medicare Other

## 2011-11-26 DIAGNOSIS — J309 Allergic rhinitis, unspecified: Secondary | ICD-10-CM

## 2011-12-13 ENCOUNTER — Ambulatory Visit (INDEPENDENT_AMBULATORY_CARE_PROVIDER_SITE_OTHER): Payer: Medicare Other

## 2011-12-13 DIAGNOSIS — J309 Allergic rhinitis, unspecified: Secondary | ICD-10-CM

## 2011-12-25 ENCOUNTER — Ambulatory Visit (INDEPENDENT_AMBULATORY_CARE_PROVIDER_SITE_OTHER): Payer: Medicare Other

## 2011-12-25 DIAGNOSIS — J309 Allergic rhinitis, unspecified: Secondary | ICD-10-CM

## 2012-01-01 ENCOUNTER — Ambulatory Visit (INDEPENDENT_AMBULATORY_CARE_PROVIDER_SITE_OTHER): Payer: Medicare Other

## 2012-01-01 DIAGNOSIS — J309 Allergic rhinitis, unspecified: Secondary | ICD-10-CM

## 2012-01-15 ENCOUNTER — Ambulatory Visit (INDEPENDENT_AMBULATORY_CARE_PROVIDER_SITE_OTHER): Payer: Medicare Other

## 2012-01-15 DIAGNOSIS — J309 Allergic rhinitis, unspecified: Secondary | ICD-10-CM

## 2012-01-21 ENCOUNTER — Encounter: Payer: Self-pay | Admitting: Internal Medicine

## 2012-01-23 ENCOUNTER — Ambulatory Visit (INDEPENDENT_AMBULATORY_CARE_PROVIDER_SITE_OTHER): Payer: Medicare Other

## 2012-01-23 DIAGNOSIS — J309 Allergic rhinitis, unspecified: Secondary | ICD-10-CM

## 2012-01-29 ENCOUNTER — Ambulatory Visit (INDEPENDENT_AMBULATORY_CARE_PROVIDER_SITE_OTHER): Payer: Medicare Other

## 2012-01-29 DIAGNOSIS — J309 Allergic rhinitis, unspecified: Secondary | ICD-10-CM

## 2012-02-07 ENCOUNTER — Ambulatory Visit (INDEPENDENT_AMBULATORY_CARE_PROVIDER_SITE_OTHER): Payer: Medicare Other

## 2012-02-07 DIAGNOSIS — J309 Allergic rhinitis, unspecified: Secondary | ICD-10-CM

## 2012-02-14 ENCOUNTER — Ambulatory Visit (INDEPENDENT_AMBULATORY_CARE_PROVIDER_SITE_OTHER): Payer: Medicare Other

## 2012-02-14 DIAGNOSIS — J309 Allergic rhinitis, unspecified: Secondary | ICD-10-CM

## 2012-02-21 ENCOUNTER — Encounter: Payer: Self-pay | Admitting: Internal Medicine

## 2012-02-21 ENCOUNTER — Ambulatory Visit (INDEPENDENT_AMBULATORY_CARE_PROVIDER_SITE_OTHER): Payer: Medicare Other | Admitting: Internal Medicine

## 2012-02-21 ENCOUNTER — Ambulatory Visit (INDEPENDENT_AMBULATORY_CARE_PROVIDER_SITE_OTHER): Payer: Medicare Other

## 2012-02-21 VITALS — BP 120/70 | HR 75 | Ht 66.0 in | Wt 215.8 lb

## 2012-02-21 DIAGNOSIS — J301 Allergic rhinitis due to pollen: Secondary | ICD-10-CM

## 2012-02-21 DIAGNOSIS — J45909 Unspecified asthma, uncomplicated: Secondary | ICD-10-CM

## 2012-02-21 DIAGNOSIS — G4733 Obstructive sleep apnea (adult) (pediatric): Secondary | ICD-10-CM

## 2012-02-21 DIAGNOSIS — J45998 Other asthma: Secondary | ICD-10-CM

## 2012-02-21 DIAGNOSIS — J309 Allergic rhinitis, unspecified: Secondary | ICD-10-CM

## 2012-02-21 MED ORDER — METHYLPREDNISOLONE ACETATE 80 MG/ML IJ SUSP
80.0000 mg | Freq: Once | INTRAMUSCULAR | Status: AC
  Administered 2012-02-21: 80 mg via INTRAMUSCULAR

## 2012-02-21 MED ORDER — LEVALBUTEROL HCL 0.63 MG/3ML IN NEBU
0.6300 mg | INHALATION_SOLUTION | Freq: Once | RESPIRATORY_TRACT | Status: AC
  Administered 2012-02-21: 0.63 mg via RESPIRATORY_TRACT

## 2012-02-21 NOTE — Progress Notes (Signed)
Subjective:    Patient ID: Christopher Shepherd, male    DOB: 1926-12-19, 78 y.o.   MRN: [id number]  HPI 03/21/11-69--year-old male never smoker followed for cough, asthma, allergic rhinitis, obstructive sleep apnea Last here October 18,2011 With season change he is beginning to wheeze a little at night and throat feels more congested. He denies nasal congestion sneezing or drainage and does not think he has a cold. He is out of inhalers. He continues allergy vaccine and believes that helps him. Dr. Karlton Lemon manages his BiPAP which he continues to use all night every night 20/8, for sleep apnea.  02/21/12- 25--year-old male never smoker followed for cough, asthma, allergic rhinitis, obstructive sleep apnea BiPAP I 20/ E 8 Sleep apnea control good and he is compliant with BiPAP. Working on his weight. These Still on vaccine and doing well with it; cough-dry; using Nasonex as well Describes 2 weeks of dry cough and throat tickle blamed on the season change. Mild head congestion and postnasal drip. Clear mucus. No fever or sore throat. Qvar inhaler was too expensive. Admits occasional wheeze.  Review of Systems- see HPI Constitutional:   No-   weight loss, night sweats, fevers, chills, fatigue, lassitude. HEENT:   No-  headaches, difficulty swallowing, tooth/dental problems, sore throat,       No-  sneezing, itching, ear ache,    +nasal congestion, post nasal drip,  CV:  No-   chest pain, orthopnea, PND, swelling in lower extremities, anasarca,  dizziness, palpitations Resp: No-   shortness of breath with exertion or at rest.              No-   productive cough,  + non-productive cough,  No-  coughing up of blood.              No-   change in color of mucus.  + wheezing.   Skin: No-   rash or lesions. GI:  No-   heartburn, indigestion, abdominal pain, nausea, vomiting,  GU:  MS:  No-   joint pain or swelling.   Neuro- nothing unusual  Psych:  No- change in mood or affect. No depression or  anxiety.  No memory loss.   Objective:   Physical Exam General- Alert, Oriented, Affect-appropriate, Distress- none acute; obese Skin- rash-none, lesions- none, excoriation- none Lymphadenopathy- none Head- atraumatic            Eyes- Gross vision intact, PERRLA, conjunctivae clear secretions            Ears- Hearing, canals-normal            Nose- Clear, no-Septal dev, mucus, polyps, erosion, perforation             Throat- Mallampati IV , mucosa dry , drainage- none, tonsils- atrophic Neck- flexible , trachea midline, no stridor , thyroid nl, carotid no bruit Chest - symmetrical excursion , unlabored           Heart/CV- RRR with skipped beats , no murmur , no gallop  , no rub, nl s1 s2                           - JVD- none , edema- none, stasis changes- none, varices- none           Lung- clear to P&A, wheeze- none, cough- none , dullness-none, rub- none           Chest wall-  Abd-  Br/ Gen/ Rectal-  Not done, not indicated Extrem- cyanosis- none, clubbing, none, atrophy- none, strength- nl Neuro- grossly intact to observation Assessment & Plan:

## 2012-02-21 NOTE — Patient Instructions (Addendum)
We can continue allergy vaccine  Neb xop 0.63  Depo 80  Sample Qvar 80   2 puffs then rinse mouth, twice daily

## 2012-02-29 NOTE — Assessment & Plan Note (Signed)
Plan-nebulizer treatment, Depo-Medrol, sample Qvar, medication talk

## 2012-02-29 NOTE — Assessment & Plan Note (Addendum)
He chooses to continue allergy vaccine. Recent exacerbation is nonspecific but could be viral or allergic. He can use otc antihistamine.

## 2012-02-29 NOTE — Assessment & Plan Note (Signed)
He describes good compliance and control. Has not achieved any weight loss.

## 2012-03-04 ENCOUNTER — Ambulatory Visit (INDEPENDENT_AMBULATORY_CARE_PROVIDER_SITE_OTHER): Payer: Medicare Other

## 2012-03-04 DIAGNOSIS — J309 Allergic rhinitis, unspecified: Secondary | ICD-10-CM

## 2012-03-18 ENCOUNTER — Ambulatory Visit (INDEPENDENT_AMBULATORY_CARE_PROVIDER_SITE_OTHER): Payer: Medicare Other

## 2012-03-18 DIAGNOSIS — J309 Allergic rhinitis, unspecified: Secondary | ICD-10-CM

## 2012-03-19 ENCOUNTER — Ambulatory Visit (INDEPENDENT_AMBULATORY_CARE_PROVIDER_SITE_OTHER): Payer: Medicare Other

## 2012-03-19 DIAGNOSIS — J309 Allergic rhinitis, unspecified: Secondary | ICD-10-CM

## 2012-03-20 ENCOUNTER — Ambulatory Visit: Payer: Medicare Other | Admitting: Internal Medicine

## 2012-03-23 ENCOUNTER — Ambulatory Visit: Payer: Medicare Other | Admitting: Internal Medicine

## 2012-03-24 ENCOUNTER — Ambulatory Visit (INDEPENDENT_AMBULATORY_CARE_PROVIDER_SITE_OTHER): Payer: Medicare Other

## 2012-03-24 DIAGNOSIS — J309 Allergic rhinitis, unspecified: Secondary | ICD-10-CM

## 2012-04-02 ENCOUNTER — Ambulatory Visit (INDEPENDENT_AMBULATORY_CARE_PROVIDER_SITE_OTHER): Payer: Medicare Other

## 2012-04-02 DIAGNOSIS — J309 Allergic rhinitis, unspecified: Secondary | ICD-10-CM

## 2012-04-17 ENCOUNTER — Ambulatory Visit (INDEPENDENT_AMBULATORY_CARE_PROVIDER_SITE_OTHER): Payer: Medicare Other

## 2012-04-17 DIAGNOSIS — J309 Allergic rhinitis, unspecified: Secondary | ICD-10-CM

## 2012-05-05 ENCOUNTER — Ambulatory Visit (INDEPENDENT_AMBULATORY_CARE_PROVIDER_SITE_OTHER): Payer: Medicare Other

## 2012-05-05 DIAGNOSIS — J309 Allergic rhinitis, unspecified: Secondary | ICD-10-CM

## 2012-05-29 ENCOUNTER — Encounter: Payer: Self-pay | Admitting: Internal Medicine

## 2012-06-02 ENCOUNTER — Ambulatory Visit (INDEPENDENT_AMBULATORY_CARE_PROVIDER_SITE_OTHER): Payer: Medicare Other

## 2012-06-02 DIAGNOSIS — J309 Allergic rhinitis, unspecified: Secondary | ICD-10-CM

## 2012-06-12 ENCOUNTER — Ambulatory Visit (INDEPENDENT_AMBULATORY_CARE_PROVIDER_SITE_OTHER): Payer: Medicare Other

## 2012-06-12 DIAGNOSIS — J309 Allergic rhinitis, unspecified: Secondary | ICD-10-CM

## 2012-06-19 ENCOUNTER — Encounter (HOSPITAL_COMMUNITY): Payer: Self-pay | Admitting: Emergency Medicine

## 2012-06-19 ENCOUNTER — Emergency Department (HOSPITAL_COMMUNITY)
Admission: EM | Admit: 2012-06-19 | Discharge: 2012-06-19 | Disposition: A | Discharge status: Home / Self Care | Payer: Medicare Other | Attending: Emergency Medicine | Admitting: Emergency Medicine

## 2012-06-19 DIAGNOSIS — Z79899 Other long term (current) drug therapy: Secondary | ICD-10-CM | POA: Insufficient documentation

## 2012-06-19 DIAGNOSIS — M7989 Other specified soft tissue disorders: Secondary | ICD-10-CM | POA: Insufficient documentation

## 2012-06-19 DIAGNOSIS — Z8709 Personal history of other diseases of the respiratory system: Secondary | ICD-10-CM | POA: Insufficient documentation

## 2012-06-19 DIAGNOSIS — J45909 Unspecified asthma, uncomplicated: Secondary | ICD-10-CM | POA: Insufficient documentation

## 2012-06-19 DIAGNOSIS — Z7982 Long term (current) use of aspirin: Secondary | ICD-10-CM | POA: Insufficient documentation

## 2012-06-19 DIAGNOSIS — Z8669 Personal history of other diseases of the nervous system and sense organs: Secondary | ICD-10-CM | POA: Insufficient documentation

## 2012-06-19 LAB — CBC WITH DIFFERENTIAL/PLATELET
Eosinophils Absolute: 0.3 10*3/uL (ref 0.0–0.7)
Lymphocytes Relative: 28 % (ref 12–46)
Lymphs Abs: 1.5 10*3/uL (ref 0.7–4.0)
Neutrophils Relative %: 58 % (ref 43–77)
Platelets: 141 10*3/uL — ABNORMAL LOW (ref 150–400)
RBC: 4.43 MIL/uL (ref 4.22–5.81)
WBC: 5.3 10*3/uL (ref 4.0–10.5)

## 2012-06-19 LAB — BASIC METABOLIC PANEL
CO2: 29 mEq/L (ref 19–32)
GFR calc non Af Amer: 47 mL/min — ABNORMAL LOW (ref >90)
Glucose, Bld: 83 mg/dL (ref 70–99)
Potassium: 3.4 mEq/L — ABNORMAL LOW (ref 3.5–5.1)
Sodium: 141 mEq/L (ref 135–145)

## 2012-06-19 NOTE — Progress Notes (Signed)
Pt confirmed pcp is [facility] EPIC updated

## 2012-06-19 NOTE — Progress Notes (Signed)
Offered support. Patient requested blanket and pillow. Found these for him. Listening, presence, support.

## 2012-06-19 NOTE — ED Notes (Signed)
Patient c/o bilateral foot pain with pain upon ambulation.  No redness noted.

## 2012-06-19 NOTE — ED Provider Notes (Signed)
History     CSN: [id number]  Arrival date & time 06/19/12  1110   First MD Initiated Contact with Patient 06/19/12 1153      Chief Complaint  Patient presents with  . Leg Swelling    bilateral feet    (Consider location/radiation/quality/duration/timing/severity/associated sxs/prior treatment) HPI Comments: Patient is an 78 year old male with a past medical history of hypertension who presents with a 1 week history of bilateral feet swelling. Patient reports gradual onset and progressive worsening of symptoms since the onset. Patient denies pain and any other associated symptoms. The swelling is constant but improved in the morning after lying flat to sleep. No aggravating/alleviating factors. Patient reports being very active when the weather is warm so he has not been outside much due to the cold weather. Patient has tried compression socks during the day which help some.    Past Medical History  Diagnosis Date  . Obstructive sleep apnea   . Cough   . Asthma   . Allergic rhinitis     Past Surgical History  Procedure Date  . None     Family History  Problem Relation Age of Onset  . Cancer Father     cause of death  . Colon cancer Mother     cause of death    History  Substance Use Topics  . Smoking status: Never Smoker   . Smokeless tobacco: Not on file  . Alcohol Use: No      Review of Systems  Cardiovascular: Positive for leg swelling.  All other systems reviewed and are negative.    Allergies  Review of patient's allergies indicates no known allergies.  Home Medications   Current Outpatient Rx  Name  Route  Sig  Dispense  Refill  . ASPIRIN 81 MG PO TABS   Oral   Take 81 mg by mouth daily.         Marland Kitchen VITAMIN D 1000 UNITS PO TABS   Oral   Take 1,000 Units by mouth daily.         Marland Kitchen HYDROCHLOROTHIAZIDE 25 MG PO TABS   Oral   Take 25 mg by mouth daily.           Marland Kitchen LOSARTAN POTASSIUM 25 MG PO TABS   Oral   Take 25 mg by mouth daily.           . MOMETASONE FUROATE 50 MCG/ACT NA SUSP   Nasal   Place 2 sprays into the nose daily.           Marland Kitchen SIMVASTATIN 80 MG PO TABS   Oral   Take 80 mg by mouth daily.           . ALBUTEROL SULFATE HFA 108 (90 BASE) MCG/ACT IN AERS   Inhalation   Inhale 2 puffs into the lungs every 4 (four) hours as needed for wheezing or shortness of breath.   1 Inhaler   prn     BP 141/66  Pulse 60  Temp 97.6 F (36.4 C)  Resp 20  Ht 5\' 6"  (1.676 m)  Wt 213 lb (96.616 kg)  BMI 34.38 kg/m2  SpO2 100%  Physical Exam  Nursing note and vitals reviewed. Constitutional: He is oriented to person, place, and time. He appears well-developed and well-nourished. No distress.  HENT:  Head: Normocephalic and atraumatic.  Eyes: Conjunctivae normal and EOM are normal. Pupils are equal, round, and reactive to light.  Neck: Normal range of motion. Neck supple.  Cardiovascular: Normal rate, regular rhythm and intact distal pulses.  Exam reveals no gallop and no friction rub.   No murmur heard.      Bilateral 2+ pitting edema of feet.   Pulmonary/Chest: Effort normal and breath sounds normal. He has no wheezes. He has no rales. He exhibits no tenderness.  Abdominal: Soft. He exhibits no distension. There is no tenderness. There is no rebound and no guarding.  Musculoskeletal: Normal range of motion.  Neurological: He is alert and oriented to person, place, and time. Coordination normal.       Speech is goal-oriented. Moves limbs without ataxia.   Skin: Skin is warm and dry. He is not diaphoretic.  Psychiatric: He has a normal mood and affect. His behavior is normal.    ED Course  Procedures (including critical care time)  Labs Reviewed  CBC WITH DIFFERENTIAL - Abnormal; Notable for the following:    Hemoglobin 12.2 (*)     HCT 37.4 (*)     Platelets 141 (*)     All other components within normal limits  BASIC METABOLIC PANEL - Abnormal; Notable for the following:    Potassium 3.4 (*)      GFR calc non Af Amer 47 (*)     GFR calc Af Amer 55 (*)     All other components within normal limits   No results found.   1. Bilateral swelling of feet       MDM  2:08 PM Labs unremarkable. Dr. Tomi Bamberger will see the patient.   2:22 PM Dr. Tomi Bamberger saw the patient who agrees the patient can be discharged with follow up and instructions to keep feet elevated when possible and continue to wear compression stockings. No further evaluation needed at this time. Vitals stable and patient can be discharged.       Alvina Chou, PA-C 06/19/12 1429

## 2012-06-19 NOTE — ED Provider Notes (Signed)
Pleasant elderly gentleman reports swelling in his feet off and on for the past 2 weeks. When he gets up in the morning the swelling is down however by the end of the day the swelling has increased. He denies any pain in his feet. He states he does have arthritis and pain in his knees. He reports he does a lot of  Gardening and spends a lot of time outdoors however now that it's colder he is less active. He does report he likes to watch football and we discussed putting his feet up while he is watching football. He also states he has a stationary bike that he brought in the house so he can exercise while watching TV. He denies any recent change in his medications. He is followed at the [facility] in [place].  Patient's noted to have bilateral swelling of the dorsum of both feet. The skin is not red or inflamed. There's no open wounds.  Medical screening examination/treatment/procedure(s) were conducted as a shared visit with non-physician practitioner(s) and myself.  I personally evaluated the patient during the encounter  Rolland Porter, MD, Alanson Aly, MD 06/19/12 1425

## 2012-06-22 NOTE — ED Provider Notes (Signed)
Medical screening examination/treatment/procedure(s) were performed by non-physician practitioner and as supervising physician I was immediately available for consultation/collaboration.  Sergio Riling. Alvino Chapel, MD 06/22/12 1021

## 2012-06-30 ENCOUNTER — Ambulatory Visit (INDEPENDENT_AMBULATORY_CARE_PROVIDER_SITE_OTHER): Payer: Medicare Other

## 2012-06-30 DIAGNOSIS — J309 Allergic rhinitis, unspecified: Secondary | ICD-10-CM

## 2012-07-14 ENCOUNTER — Ambulatory Visit (INDEPENDENT_AMBULATORY_CARE_PROVIDER_SITE_OTHER): Payer: Medicare Other

## 2012-07-14 DIAGNOSIS — J309 Allergic rhinitis, unspecified: Secondary | ICD-10-CM

## 2012-07-21 ENCOUNTER — Ambulatory Visit (INDEPENDENT_AMBULATORY_CARE_PROVIDER_SITE_OTHER): Payer: Medicare Other

## 2012-07-21 DIAGNOSIS — J309 Allergic rhinitis, unspecified: Secondary | ICD-10-CM

## 2012-07-28 ENCOUNTER — Ambulatory Visit: Payer: Medicare Other

## 2012-08-04 ENCOUNTER — Ambulatory Visit (INDEPENDENT_AMBULATORY_CARE_PROVIDER_SITE_OTHER): Payer: Medicare Other

## 2012-08-04 ENCOUNTER — Ambulatory Visit: Payer: Medicare Other

## 2012-08-04 DIAGNOSIS — J309 Allergic rhinitis, unspecified: Secondary | ICD-10-CM

## 2012-08-11 ENCOUNTER — Ambulatory Visit: Payer: Medicare Other

## 2012-08-18 ENCOUNTER — Ambulatory Visit (INDEPENDENT_AMBULATORY_CARE_PROVIDER_SITE_OTHER): Payer: Medicare Other

## 2012-08-18 DIAGNOSIS — J309 Allergic rhinitis, unspecified: Secondary | ICD-10-CM

## 2012-08-25 ENCOUNTER — Ambulatory Visit (INDEPENDENT_AMBULATORY_CARE_PROVIDER_SITE_OTHER): Payer: Medicare Other

## 2012-08-25 DIAGNOSIS — J309 Allergic rhinitis, unspecified: Secondary | ICD-10-CM

## 2012-09-01 ENCOUNTER — Ambulatory Visit: Payer: Medicare Other

## 2012-09-08 ENCOUNTER — Ambulatory Visit (INDEPENDENT_AMBULATORY_CARE_PROVIDER_SITE_OTHER): Payer: Medicare Other

## 2012-09-08 DIAGNOSIS — J309 Allergic rhinitis, unspecified: Secondary | ICD-10-CM

## 2012-09-15 ENCOUNTER — Ambulatory Visit: Payer: Medicare Other

## 2012-09-24 ENCOUNTER — Ambulatory Visit (INDEPENDENT_AMBULATORY_CARE_PROVIDER_SITE_OTHER): Payer: Medicare Other

## 2012-09-24 DIAGNOSIS — J309 Allergic rhinitis, unspecified: Secondary | ICD-10-CM

## 2012-09-29 ENCOUNTER — Ambulatory Visit: Payer: Medicare Other

## 2012-10-01 ENCOUNTER — Ambulatory Visit (INDEPENDENT_AMBULATORY_CARE_PROVIDER_SITE_OTHER): Payer: Medicare Other

## 2012-10-01 DIAGNOSIS — J309 Allergic rhinitis, unspecified: Secondary | ICD-10-CM

## 2012-10-06 ENCOUNTER — Ambulatory Visit (INDEPENDENT_AMBULATORY_CARE_PROVIDER_SITE_OTHER): Payer: Medicare Other

## 2012-10-06 DIAGNOSIS — J309 Allergic rhinitis, unspecified: Secondary | ICD-10-CM

## 2012-10-13 ENCOUNTER — Ambulatory Visit (INDEPENDENT_AMBULATORY_CARE_PROVIDER_SITE_OTHER): Payer: Medicare Other

## 2012-10-13 DIAGNOSIS — J309 Allergic rhinitis, unspecified: Secondary | ICD-10-CM

## 2012-10-20 ENCOUNTER — Ambulatory Visit (INDEPENDENT_AMBULATORY_CARE_PROVIDER_SITE_OTHER): Payer: Medicare Other

## 2012-10-20 DIAGNOSIS — J309 Allergic rhinitis, unspecified: Secondary | ICD-10-CM

## 2012-10-22 ENCOUNTER — Ambulatory Visit (INDEPENDENT_AMBULATORY_CARE_PROVIDER_SITE_OTHER): Payer: Medicare Other

## 2012-10-22 DIAGNOSIS — J309 Allergic rhinitis, unspecified: Secondary | ICD-10-CM

## 2012-10-27 ENCOUNTER — Ambulatory Visit (INDEPENDENT_AMBULATORY_CARE_PROVIDER_SITE_OTHER): Payer: Medicare Other

## 2012-10-27 DIAGNOSIS — J309 Allergic rhinitis, unspecified: Secondary | ICD-10-CM

## 2012-10-30 ENCOUNTER — Encounter: Payer: Self-pay | Admitting: Podiatry

## 2012-10-30 ENCOUNTER — Ambulatory Visit (INDEPENDENT_AMBULATORY_CARE_PROVIDER_SITE_OTHER): Payer: Medicare Other | Admitting: Podiatry

## 2012-10-30 VITALS — BP 143/65 | HR 66 | Ht 66.5 in | Wt 218.0 lb

## 2012-10-30 DIAGNOSIS — M25579 Pain in unspecified ankle and joints of unspecified foot: Secondary | ICD-10-CM

## 2012-10-30 DIAGNOSIS — B351 Tinea unguium: Secondary | ICD-10-CM | POA: Insufficient documentation

## 2012-10-30 NOTE — Progress Notes (Signed)
S:  Problem with toe nails. Toes hurt from thick toe nails on both great toes.  Ankle swelling come and go. Raising them up on pillow at night helps.  Patient requests toe nails trimmed. No other problems at this time. O:  Positive of ankle edema bilateral. DP Both palpable. PT both not palpable. Onychauxis x 10.  No open lesions.  A:  Onychomycosis x 10. Ankle edema bilateral. P:  Palliation. Debrided all nails x 10.

## 2012-11-03 ENCOUNTER — Ambulatory Visit: Payer: Medicare Other

## 2012-11-10 ENCOUNTER — Ambulatory Visit (INDEPENDENT_AMBULATORY_CARE_PROVIDER_SITE_OTHER): Payer: Medicare Other

## 2012-11-10 DIAGNOSIS — J309 Allergic rhinitis, unspecified: Secondary | ICD-10-CM

## 2012-11-16 ENCOUNTER — Encounter: Payer: Self-pay | Admitting: Internal Medicine

## 2012-11-25 ENCOUNTER — Ambulatory Visit (INDEPENDENT_AMBULATORY_CARE_PROVIDER_SITE_OTHER): Payer: Medicare Other

## 2012-11-25 DIAGNOSIS — J309 Allergic rhinitis, unspecified: Secondary | ICD-10-CM

## 2012-11-30 ENCOUNTER — Ambulatory Visit (INDEPENDENT_AMBULATORY_CARE_PROVIDER_SITE_OTHER): Payer: Medicare Other

## 2012-11-30 DIAGNOSIS — J309 Allergic rhinitis, unspecified: Secondary | ICD-10-CM

## 2012-12-01 ENCOUNTER — Ambulatory Visit: Payer: Medicare Other

## 2012-12-08 ENCOUNTER — Ambulatory Visit (INDEPENDENT_AMBULATORY_CARE_PROVIDER_SITE_OTHER): Payer: Medicare Other

## 2012-12-08 DIAGNOSIS — J309 Allergic rhinitis, unspecified: Secondary | ICD-10-CM

## 2012-12-29 ENCOUNTER — Ambulatory Visit (INDEPENDENT_AMBULATORY_CARE_PROVIDER_SITE_OTHER): Payer: Medicare Other

## 2012-12-29 DIAGNOSIS — J309 Allergic rhinitis, unspecified: Secondary | ICD-10-CM

## 2013-01-04 ENCOUNTER — Ambulatory Visit (INDEPENDENT_AMBULATORY_CARE_PROVIDER_SITE_OTHER): Payer: Medicare Other

## 2013-01-04 DIAGNOSIS — J309 Allergic rhinitis, unspecified: Secondary | ICD-10-CM

## 2013-01-14 ENCOUNTER — Ambulatory Visit (INDEPENDENT_AMBULATORY_CARE_PROVIDER_SITE_OTHER): Payer: Medicare Other

## 2013-01-14 DIAGNOSIS — J309 Allergic rhinitis, unspecified: Secondary | ICD-10-CM

## 2013-01-19 ENCOUNTER — Ambulatory Visit: Payer: Medicare Other

## 2013-01-29 ENCOUNTER — Encounter: Payer: Self-pay | Admitting: Podiatry

## 2013-01-29 ENCOUNTER — Ambulatory Visit (INDEPENDENT_AMBULATORY_CARE_PROVIDER_SITE_OTHER): Payer: Medicare Other | Admitting: Podiatry

## 2013-01-29 ENCOUNTER — Ambulatory Visit: Payer: PRIVATE HEALTH INSURANCE | Admitting: Podiatry

## 2013-01-29 VITALS — BP 140/58 | HR 78 | Ht 66.0 in | Wt 200.0 lb

## 2013-01-29 DIAGNOSIS — M25579 Pain in unspecified ankle and joints of unspecified foot: Secondary | ICD-10-CM

## 2013-01-29 DIAGNOSIS — B351 Tinea unguium: Secondary | ICD-10-CM

## 2013-01-29 NOTE — Progress Notes (Signed)
Doing well except right knee pain. Patient wants toe nails trimmed.  Objective: HAV with bunion bilateral. MMN x 10.  Pedal pulses are not palpable. No new problems noted.  Assessment: Onychomycosis x 10. Hallux valgus with bunion bilateral, asymptomatic.   Plan: Debrided all nails x 10. Return in 3 months or as needed.

## 2013-02-01 ENCOUNTER — Ambulatory Visit (INDEPENDENT_AMBULATORY_CARE_PROVIDER_SITE_OTHER): Payer: Medicare Other

## 2013-02-01 ENCOUNTER — Telehealth: Payer: Self-pay | Admitting: Internal Medicine

## 2013-02-01 ENCOUNTER — Encounter: Payer: Self-pay | Admitting: Internal Medicine

## 2013-02-01 ENCOUNTER — Ambulatory Visit (INDEPENDENT_AMBULATORY_CARE_PROVIDER_SITE_OTHER): Payer: Medicare Other | Admitting: Internal Medicine

## 2013-02-01 VITALS — BP 114/54 | HR 73 | Ht 66.0 in | Wt 214.8 lb

## 2013-02-01 DIAGNOSIS — J309 Allergic rhinitis, unspecified: Secondary | ICD-10-CM

## 2013-02-01 DIAGNOSIS — J45998 Other asthma: Secondary | ICD-10-CM

## 2013-02-01 DIAGNOSIS — J45909 Unspecified asthma, uncomplicated: Secondary | ICD-10-CM

## 2013-02-01 MED ORDER — METHYLPREDNISOLONE ACETATE 80 MG/ML IJ SUSP
80.0000 mg | Freq: Once | INTRAMUSCULAR | Status: AC
  Administered 2013-02-01: 80 mg via INTRAMUSCULAR

## 2013-02-01 MED ORDER — LEVALBUTEROL HCL 0.63 MG/3ML IN NEBU
0.6300 mg | INHALATION_SOLUTION | Freq: Once | RESPIRATORY_TRACT | Status: AC
  Administered 2013-02-01: 0.63 mg via RESPIRATORY_TRACT

## 2013-02-01 NOTE — Telephone Encounter (Signed)
Per CDY:  Ok to work pt in today for breathing tx  -----  Called, spoke with pt -  We have scheduled him to see CDY today at 2 pm.  Pt aware.

## 2013-02-01 NOTE — Telephone Encounter (Signed)
I spoke with pt. He reports dry cough, wheezing, chest tx, nasal congestion, slight PND x 1 week. He is taking tussin but not helping. He is requesting to come in for breathing TX. Please advise Dr. Annamaria Boots thanks Pending 02/22/13 Last OV 02/21/12 No Known Allergies

## 2013-02-01 NOTE — Progress Notes (Signed)
Subjective:    Patient ID: Christopher Shepherd, male    DOB: 08-20-1926, 79 y.o.   MRN: [id number]  HPI 03/21/11-95--year-old male never smoker followed for cough, asthma, allergic rhinitis, obstructive sleep apnea Last here October 18,2011 With season change he is beginning to wheeze a little at night and throat feels more congested. He denies nasal congestion sneezing or drainage and does not think he has a cold. He is out of inhalers. He continues allergy vaccine and believes that helps him. Dr. Karlton Lemon manages his BiPAP which he continues to use all night every night 20/8, for sleep apnea.  02/21/12- 65--year-old male never smoker followed for cough, asthma, allergic rhinitis, obstructive sleep apnea BiPAP I 20/ E 8 Sleep apnea control good and he is compliant with BiPAP. Working on his weight. These Still on vaccine and doing well with it; cough-dry; using Nasonex as well Describes 2 weeks of dry cough and throat tickle blamed on the season change. Mild head congestion and postnasal drip. Clear mucus. No fever or sore throat. Qvar inhaler was too expensive. Admits occasional wheeze.  02/01/13- 70--year-old male never smoker followed for cough, asthma, allergic rhinitis, obstructive sleep apnea BiPAP I 20/ E 8 Acute Visit.  C/o dry cough, wheezing, chest tightness, nasal congestion, and PND x 1 wk. Asking for neb and depo. "Same thing around this time every year" No fever or purulent.  Review of Systems- see HPI Constitutional:   No-   weight loss, night sweats, fevers, chills, fatigue, lassitude. HEENT:   No-  headaches, difficulty swallowing, tooth/dental problems, sore throat,       No-  sneezing, itching, ear ache,    +nasal congestion, +post nasal drip,  CV:  No-   chest pain, orthopnea, PND, swelling in lower extremities, anasarca,  dizziness, palpitations Resp: No-   shortness of breath with exertion or at rest.              No-   productive cough,  + non-productive cough,  No-   coughing up of blood.              No-   change in color of mucus.  + wheezing.   Skin: No-   rash or lesions. GI:  No-   heartburn, indigestion, abdominal pain, nausea, vomiting,  GU:  MS:  No-   joint pain or swelling.   Neuro- nothing unusual  Psych:  No- change in mood or affect. No depression or anxiety.  No memory loss.   Objective:   Physical Exam General- Alert, Oriented, Affect-appropriate, Distress- none acute; obese Skin- rash-none, lesions- none, excoriation- none Lymphadenopathy- none Head- atraumatic            Eyes- Gross vision intact, PERRLA, conjunctivae clear secretions            Ears- Hearing, canals-normal            Nose- + turbinate edema, no-Septal dev, mucus, polyps, erosion, perforation             Throat- Mallampati IV , mucosa dry , drainage- none, tonsils- atrophic Neck- flexible , trachea midline, no stridor , thyroid nl, carotid no bruit Chest - symmetrical excursion , unlabored           Heart/CV- RRR with skipped beats , no murmur , no gallop  , no rub, nl s1 s2                           -  JVD- none , edema- none, stasis changes- none, varices- none           Lung- clear to P&A, + shallow, wheeze- none, cough+ dry , dullness-none, rub- none           Chest wall-  Abd-  Br/ Gen/ Rectal- Not done, not indicated Extrem- cyanosis- none, clubbing, none, atrophy- none, strength- nl Neuro- grossly intact to observation Assessment & Plan:

## 2013-02-01 NOTE — Patient Instructions (Addendum)
Neb xop    Depo 80  Please call as needed  Cancel August appointment

## 2013-02-09 ENCOUNTER — Ambulatory Visit (INDEPENDENT_AMBULATORY_CARE_PROVIDER_SITE_OTHER): Payer: Medicare Other

## 2013-02-09 DIAGNOSIS — J309 Allergic rhinitis, unspecified: Secondary | ICD-10-CM

## 2013-02-09 NOTE — Assessment & Plan Note (Addendum)
Exacerbation of asthma with bronchitis. No obvious trigger.

## 2013-02-22 ENCOUNTER — Ambulatory Visit: Payer: Medicare Other | Admitting: Internal Medicine

## 2013-02-23 ENCOUNTER — Ambulatory Visit (INDEPENDENT_AMBULATORY_CARE_PROVIDER_SITE_OTHER): Payer: Medicare Other

## 2013-02-23 DIAGNOSIS — J309 Allergic rhinitis, unspecified: Secondary | ICD-10-CM

## 2013-03-02 ENCOUNTER — Ambulatory Visit: Payer: Medicare Other

## 2013-03-04 ENCOUNTER — Ambulatory Visit (INDEPENDENT_AMBULATORY_CARE_PROVIDER_SITE_OTHER): Payer: Medicare Other

## 2013-03-04 DIAGNOSIS — J309 Allergic rhinitis, unspecified: Secondary | ICD-10-CM

## 2013-03-12 ENCOUNTER — Ambulatory Visit (INDEPENDENT_AMBULATORY_CARE_PROVIDER_SITE_OTHER): Payer: Medicare Other

## 2013-03-12 DIAGNOSIS — J309 Allergic rhinitis, unspecified: Secondary | ICD-10-CM

## 2013-03-23 ENCOUNTER — Ambulatory Visit (INDEPENDENT_AMBULATORY_CARE_PROVIDER_SITE_OTHER): Payer: Medicare Other

## 2013-03-23 DIAGNOSIS — J309 Allergic rhinitis, unspecified: Secondary | ICD-10-CM

## 2013-03-31 ENCOUNTER — Ambulatory Visit (INDEPENDENT_AMBULATORY_CARE_PROVIDER_SITE_OTHER): Payer: Medicare Other

## 2013-03-31 DIAGNOSIS — J309 Allergic rhinitis, unspecified: Secondary | ICD-10-CM

## 2013-04-06 ENCOUNTER — Ambulatory Visit (INDEPENDENT_AMBULATORY_CARE_PROVIDER_SITE_OTHER): Payer: Medicare Other

## 2013-04-06 DIAGNOSIS — Z23 Encounter for immunization: Secondary | ICD-10-CM

## 2013-04-06 DIAGNOSIS — J309 Allergic rhinitis, unspecified: Secondary | ICD-10-CM

## 2013-04-07 ENCOUNTER — Ambulatory Visit (INDEPENDENT_AMBULATORY_CARE_PROVIDER_SITE_OTHER): Payer: Medicare Other

## 2013-04-07 DIAGNOSIS — J309 Allergic rhinitis, unspecified: Secondary | ICD-10-CM

## 2013-04-13 ENCOUNTER — Ambulatory Visit (INDEPENDENT_AMBULATORY_CARE_PROVIDER_SITE_OTHER): Payer: Medicare Other

## 2013-04-13 DIAGNOSIS — J309 Allergic rhinitis, unspecified: Secondary | ICD-10-CM

## 2013-04-26 ENCOUNTER — Ambulatory Visit (INDEPENDENT_AMBULATORY_CARE_PROVIDER_SITE_OTHER): Payer: Medicare Other

## 2013-04-26 DIAGNOSIS — J309 Allergic rhinitis, unspecified: Secondary | ICD-10-CM

## 2013-05-04 ENCOUNTER — Ambulatory Visit (INDEPENDENT_AMBULATORY_CARE_PROVIDER_SITE_OTHER): Payer: Medicare Other

## 2013-05-04 DIAGNOSIS — J309 Allergic rhinitis, unspecified: Secondary | ICD-10-CM

## 2013-05-07 ENCOUNTER — Encounter: Payer: Self-pay | Admitting: Podiatry

## 2013-05-07 ENCOUNTER — Ambulatory Visit (INDEPENDENT_AMBULATORY_CARE_PROVIDER_SITE_OTHER): Payer: Medicare Other | Admitting: Podiatry

## 2013-05-07 VITALS — BP 148/93 | HR 77 | Ht 66.0 in | Wt 210.0 lb

## 2013-05-07 DIAGNOSIS — M25579 Pain in unspecified ankle and joints of unspecified foot: Secondary | ICD-10-CM

## 2013-05-07 DIAGNOSIS — B351 Tinea unguium: Secondary | ICD-10-CM

## 2013-05-07 NOTE — Patient Instructions (Signed)
Seen for hypertrophic nails x 10. All nails debrided. Return in 3 months or as needed.

## 2013-05-07 NOTE — Progress Notes (Signed)
Subjective: Patient presents requesting toe nails trimmed. No new problems.   Objective:  HAV with bunion bilateral. MMN x 10.  Pedal pulses are not palpable.  No abnormal findings noted.   Assessment: Onychomycosis x 10.  Hallux valgus with bunion bilateral, asymptomatic.   Plan: Debrided all nails x 10.  Return in 3 months or as needed.

## 2013-06-10 ENCOUNTER — Ambulatory Visit (INDEPENDENT_AMBULATORY_CARE_PROVIDER_SITE_OTHER): Payer: Medicare Other

## 2013-06-10 DIAGNOSIS — J309 Allergic rhinitis, unspecified: Secondary | ICD-10-CM

## 2013-06-16 ENCOUNTER — Ambulatory Visit (INDEPENDENT_AMBULATORY_CARE_PROVIDER_SITE_OTHER): Payer: Medicare Other

## 2013-06-16 DIAGNOSIS — J309 Allergic rhinitis, unspecified: Secondary | ICD-10-CM

## 2013-06-17 ENCOUNTER — Ambulatory Visit: Payer: Medicare Other

## 2013-06-29 ENCOUNTER — Ambulatory Visit (INDEPENDENT_AMBULATORY_CARE_PROVIDER_SITE_OTHER): Payer: Medicare Other

## 2013-06-29 DIAGNOSIS — J309 Allergic rhinitis, unspecified: Secondary | ICD-10-CM

## 2013-07-06 ENCOUNTER — Ambulatory Visit (INDEPENDENT_AMBULATORY_CARE_PROVIDER_SITE_OTHER): Payer: Medicare Other

## 2013-07-06 DIAGNOSIS — J309 Allergic rhinitis, unspecified: Secondary | ICD-10-CM

## 2013-07-13 ENCOUNTER — Ambulatory Visit (INDEPENDENT_AMBULATORY_CARE_PROVIDER_SITE_OTHER): Payer: Medicare Other

## 2013-07-13 DIAGNOSIS — J309 Allergic rhinitis, unspecified: Secondary | ICD-10-CM

## 2013-07-20 ENCOUNTER — Ambulatory Visit: Payer: Medicare Other

## 2013-07-27 ENCOUNTER — Ambulatory Visit (INDEPENDENT_AMBULATORY_CARE_PROVIDER_SITE_OTHER): Payer: Medicare Other

## 2013-07-27 DIAGNOSIS — J309 Allergic rhinitis, unspecified: Secondary | ICD-10-CM

## 2013-08-03 ENCOUNTER — Ambulatory Visit: Payer: Medicare Other

## 2013-08-06 ENCOUNTER — Encounter: Payer: Self-pay | Admitting: Podiatry

## 2013-08-06 ENCOUNTER — Ambulatory Visit (INDEPENDENT_AMBULATORY_CARE_PROVIDER_SITE_OTHER): Payer: Medicare Other | Admitting: Podiatry

## 2013-08-06 VITALS — BP 155/68 | HR 66 | Ht 66.0 in | Wt 210.0 lb

## 2013-08-06 DIAGNOSIS — M25579 Pain in unspecified ankle and joints of unspecified foot: Secondary | ICD-10-CM

## 2013-08-06 DIAGNOSIS — B351 Tinea unguium: Secondary | ICD-10-CM

## 2013-08-06 NOTE — Progress Notes (Signed)
Subjective:  Patient presents requesting toe nails trimmed. No new problems other than right knee problem. Stated that he will have right knee surgery in April. Objective:  HAV with bunion bilateral. MMN x 10.  Pedal pulses are faintly palpable in DP, not palpable in PT bilateral. Severe dry scaly skin plantar surface bilateral. Assessment: Onychomycosis x 10.  Hallux valgus with bunion bilateral, asymptomatic.  Xerotic skin. Plan: Debrided all nails x 10.  Return in 3 months or as needed

## 2013-08-06 NOTE — Patient Instructions (Signed)
Seen for hypertrophic nails. All nails debrided. Return in 3 months or as needed.  

## 2013-08-10 ENCOUNTER — Ambulatory Visit (INDEPENDENT_AMBULATORY_CARE_PROVIDER_SITE_OTHER): Payer: Medicare Other

## 2013-08-10 DIAGNOSIS — J309 Allergic rhinitis, unspecified: Secondary | ICD-10-CM

## 2013-08-17 ENCOUNTER — Ambulatory Visit: Payer: Medicare Other

## 2013-08-23 ENCOUNTER — Ambulatory Visit (INDEPENDENT_AMBULATORY_CARE_PROVIDER_SITE_OTHER): Payer: Medicare Other

## 2013-08-23 DIAGNOSIS — J309 Allergic rhinitis, unspecified: Secondary | ICD-10-CM

## 2013-09-07 ENCOUNTER — Ambulatory Visit (INDEPENDENT_AMBULATORY_CARE_PROVIDER_SITE_OTHER): Payer: Medicare Other

## 2013-09-07 DIAGNOSIS — J309 Allergic rhinitis, unspecified: Secondary | ICD-10-CM

## 2013-09-09 ENCOUNTER — Other Ambulatory Visit: Payer: Self-pay | Admitting: Orthopedic Surgery

## 2013-09-09 NOTE — H&P (Signed)
Christopher Shepherd DOB: 05/03/1927 Married / Language: English / Race: Black or African American Male  Date of Admission:  10-04-2013  Chief Complaint:  Right Knee Pain  History of Present Illness The patient is a 80 year old male who comes in for a preoperative History and Physical. The patient is scheduled for a right total knee arthroplasty to be performed by Dr. Dione Plover. Aluisio, MD at [facility] on 10-04-2013. The patient is a 80 year old male who presents today for follow up of their knee. The patient is being followed for their right knee pain and osteoarthritis. They are now several months out from Synvisc series. Symptoms reported today include: pain, aching and difficulty ambulating. The patient feels that they are doing poorly and report their pain level to be moderate to severe. Current treatment includes: NSAIDs (ibuprofen) and icing. The patient has not gotten any relief of their symptoms with viscosupplementation. The patient indicates that they have questions or concerns today regarding surgery. Christopher Shepherd is at a stage now where the injections do not help anymore. The knee is hurting at all times. It is especially bad with activity. Generally he can still sleep well, but he is having more difficulties with functional activities. He would like to be more active, but the knee is preventing him from doing so. He would like to proceed with surgery for the right knee at this time. They have been treated conservatively in the past for the above stated problem and despite conservative measures, they continue to have progressive pain and severe functional limitations and dysfunction. They have failed non-operative management including home exercise, medications, and injections. It is felt that they would benefit from undergoing total joint replacement. Risks and benefits of the procedure have been discussed with the patient and they elect to proceed with surgery. There are no  active contraindications to surgery such as ongoing infection or rapidly progressive neurological disease.   Allergies No Known Drug Allergies   Problem List/Past Medical Primary osteoarthritis of one knee (715.16)    Family History Cancer. Mother. mother    Social History Exercise. Exercises weekly; does other Drug/Alcohol Rehab (Previously). no Illicit drug use. no Current work status. retired Tobacco / smoke exposure. yes outdoors only Marital status. married Pain Contract. no Tobacco use. Former smoker. former smoker; uses 1 1/2 can(s) smokeless per week Drug/Alcohol Rehab (Currently). no Living situation. live with spouse Alcohol use. current drinker; drinks beer; only occasionally per week Post-Surgical Plans. [facility]    Medication History Hydrochlorothiazide (25MG  Tablet, Oral) Active. Losartan Potassium (25MG  Tablet, Oral) Active. Aspirin EC (81MG  Tablet DR, Oral) Active. Atorvastatin Calcium (80MG  Tablet, Oral) Active. Vitamin D ( Oral) Specific dose unknown - Active. Fish Oil Active. Ibuprofen (200MG  Tablet, Oral as needed) Active.    Past Surgical History No pertinent past surgical history    Past Medical History High blood pressure Sleep Apnea. uses CPAP Shingles    Review of Systems General:Not Present- Chills, Fever, Night Sweats, Fatigue, Weight Gain, Weight Loss and Memory Loss. Skin:Not Present- Hives, Itching, Rash, Eczema and Lesions. HEENT:Not Present- Tinnitus, Headache, Double Vision, Visual Loss, Hearing Loss and Dentures. Respiratory:Present- Wheezing (occassional if gets nasal congestion but none recently). Not Present- Shortness of breath with exertion, Shortness of breath at rest, Allergies, Coughing up blood and Chronic Cough. Cardiovascular:Not Present- Chest Pain, Racing/skipping heartbeats, Difficulty Breathing Lying Down, Murmur, Swelling and Palpitations. Gastrointestinal:Not  Present- Bloody Stool, Heartburn, Abdominal Pain, Vomiting, Nausea, Constipation, Diarrhea, Difficulty Swallowing, Jaundice and  Loss of appetitie. Male Genitourinary:Present- Urinating at Night. Not Present- Urinary frequency, Blood in Urine, Weak urinary stream, Discharge, Flank Pain, Incontinence, Painful Urination, Urgency and Urinary Retention. Musculoskeletal:Present- Joint Pain. Not Present- Muscle Weakness, Muscle Pain, Joint Swelling, Back Pain, Morning Stiffness and Spasms. Neurological:Not Present- Tremor, Dizziness, Blackout spells, Paralysis, Difficulty with balance and Weakness. Psychiatric:Not Present- Insomnia.    Vitals Weight: 211 lb Height: 66 in Weight was reported by patient. Height was reported by patient. Body Surface Area: 2.11 m Body Mass Index: 34.06 kg/m Pulse: 60 (Regular) Resp.: 14 (Unlabored) BP: 148/76 (Sitting, Left Arm, Standard)     Physical Exam The physical exam findings are as follows:  Note: Patient is an 80 year old male with continued knee pain. Patient is accompanied today by his wife Christopher Shepherd.   General Mental Status - Alert, cooperative and good historian. General Appearance- pleasant. Not in acute distress. Orientation- Oriented X3. Build & Nutrition- Well nourished and Well developed.   Head and Neck Head- normocephalic, atraumatic . Neck Global Assessment- supple. no bruit auscultated on the right and no bruit auscultated on the left.   Eye Pupil- Bilateral- Regular and Round. Motion- Bilateral- EOMI.   Chest and Lung Exam Auscultation: Breath sounds:- clear at anterior chest wall and - clear at posterior chest wall. Adventitious sounds:- No Adventitious sounds.   Cardiovascular Auscultation:Rhythm- Regular rate and rhythm. Heart Sounds- S1 WNL and S2 WNL. Murmurs & Other Heart Sounds:Auscultation of the heart reveals - No  Murmurs.   Abdomen Palpation/Percussion:Tenderness- Abdomen is non-tender to palpation. Rigidity (guarding)- Abdomen is soft. Auscultation:Auscultation of the abdomen reveals - Bowel sounds normal.   Male Genitourinary Not done, not pertinent to present illness  Musculoskeletal On exam he is alert and oriented in no apparent distress. His hips show normal range of motion and no discomfort. The left knee no effusion. Range about 0 to 125. Moderate crepitus on range of motion, no specific joint line tenderness or instability. The right knee significant valgus deformity. There is no effusion. Range on the right is about 5 to 125 with marked crepitus on range of motion, tender lateral greater than medial with no instability noted.  RADIOGRAPHS: AP both knees and lateral show severe endstage arthritis of the right knee, bone on bone lateral and patellofemoral with significant narrowing medially also. He has about a 15 degree valgus deformity. The left knee shows degeneration also, but to a much lesser degree.   Assessment & Plan Primary osteoarthritis of one knee (715.16) Impression: Right Knee  Note: Plan is for a Right Total Knee Replacement by Dr. Wynelle Link.  Plan is to go to [facility]  PCP - Dr. Willey Blade  The patient does not have any contraindications and will receive TXA (tranexamic acid) prior to surgery.  Signed electronically by Joelene Millin, III PA-C

## 2013-09-14 ENCOUNTER — Ambulatory Visit (INDEPENDENT_AMBULATORY_CARE_PROVIDER_SITE_OTHER): Payer: Medicare Other

## 2013-09-14 DIAGNOSIS — J309 Allergic rhinitis, unspecified: Secondary | ICD-10-CM

## 2013-09-21 ENCOUNTER — Ambulatory Visit: Payer: Medicare Other

## 2013-09-23 ENCOUNTER — Encounter (HOSPITAL_COMMUNITY): Payer: Self-pay | Admitting: Pharmacy Technician

## 2013-09-24 ENCOUNTER — Other Ambulatory Visit (HOSPITAL_COMMUNITY): Payer: Self-pay | Admitting: Orthopedic Surgery

## 2013-09-24 ENCOUNTER — Encounter: Payer: Self-pay | Admitting: Internal Medicine

## 2013-09-24 NOTE — Progress Notes (Signed)
Clearance with note Dr Karlton Lemon chart

## 2013-09-24 NOTE — Patient Instructions (Signed)
      Your procedure is scheduled on:  10/04/13  MONDAY  Report to [facility] at  [phone]     AM.  Call this number if you have problems the morning of surgery: [place]   Do not eat food  Or drink :After Midnight. Sunday NIGHT   Take these medicines the morning of surgery with A SIP OF WATER:No regular scheduled meds-- May Use NASONEX NASAL SPRAY IF NEEDED   .  Contacts, dentures or partial plates, or metal hairpins  can not be worn to surgery. Your family will be responsible for glasses, dentures, hearing aides while you are in surgery  Leave suitcase in the car. After surgery it may be brought to your room.  For patients admitted to the hospital, checkout time is 11:00 AM day of  discharge.                DO NOT WEAR JEWELRY, LOTIONS, POWDERS, OR PERFUMES.  WOMEN-- DO NOT SHAVE LEGS OR UNDERARMS FOR 48 HOURS BEFORE SHOWERS. MEN MAY SHAVE FACE.  Patients discharged the day of surgery will not be allowed to drive home. IF going home the day of surgery, you must have a driver and someone to stay with you for the first 24 hours  Name and phone number of your driver:     admission                                                                   Please read over the following fact sheets that you were given: MRSA Information, Incentive Spirometry Sheet, Blood Transfusion Sheet  Information                     FAILURE TO [place]                                                  Patient Signature _____________________________

## 2013-09-27 ENCOUNTER — Ambulatory Visit (HOSPITAL_COMMUNITY)
Admission: RE | Admit: 2013-09-27 | Discharge: 2013-09-27 | Disposition: A | Discharge status: Home / Self Care | Payer: Medicare Other | Source: Ambulatory Visit | Attending: Anesthesiology | Admitting: Anesthesiology

## 2013-09-27 ENCOUNTER — Encounter (HOSPITAL_COMMUNITY): Payer: Self-pay

## 2013-09-27 ENCOUNTER — Encounter (HOSPITAL_COMMUNITY)
Admission: RE | Admit: 2013-09-27 | Discharge: 2013-09-27 | Disposition: A | Discharge status: Home / Self Care | Payer: Medicare Other | Source: Ambulatory Visit | Attending: Orthopedic Surgery | Admitting: Orthopedic Surgery

## 2013-09-27 DIAGNOSIS — I10 Essential (primary) hypertension: Secondary | ICD-10-CM | POA: Insufficient documentation

## 2013-09-27 DIAGNOSIS — Z01812 Encounter for preprocedural laboratory examination: Secondary | ICD-10-CM | POA: Insufficient documentation

## 2013-09-27 DIAGNOSIS — Z01810 Encounter for preprocedural cardiovascular examination: Secondary | ICD-10-CM | POA: Insufficient documentation

## 2013-09-27 DIAGNOSIS — Z01818 Encounter for other preprocedural examination: Secondary | ICD-10-CM | POA: Insufficient documentation

## 2013-09-27 DIAGNOSIS — I498 Other specified cardiac arrhythmias: Secondary | ICD-10-CM | POA: Insufficient documentation

## 2013-09-27 DIAGNOSIS — I517 Cardiomegaly: Secondary | ICD-10-CM | POA: Insufficient documentation

## 2013-09-27 HISTORY — DX: Unspecified osteoarthritis, unspecified site: M19.90

## 2013-09-27 HISTORY — DX: Essential (primary) hypertension: I10

## 2013-09-27 LAB — COMPREHENSIVE METABOLIC PANEL
ALK PHOS: 81 U/L (ref 39–117)
ALT: 17 U/L (ref 0–53)
AST: 16 U/L (ref 0–37)
Albumin: 3.2 g/dL — ABNORMAL LOW (ref 3.5–5.2)
BUN: 14 mg/dL (ref 6–23)
CO2: 27 mEq/L (ref 19–32)
Calcium: 8.7 mg/dL (ref 8.4–10.5)
Chloride: 103 mEq/L (ref 96–112)
Creatinine, Ser: 1.38 mg/dL — ABNORMAL HIGH (ref 0.50–1.35)
GFR calc Af Amer: 53 mL/min — ABNORMAL LOW (ref >90)
GFR calc non Af Amer: 45 mL/min — ABNORMAL LOW (ref >90)
Glucose, Bld: 88 mg/dL (ref 70–99)
POTASSIUM: 3.7 meq/L (ref 3.7–5.3)
SODIUM: 141 meq/L (ref 137–147)
Total Bilirubin: 0.4 mg/dL (ref 0.3–1.2)
Total Protein: 6.6 g/dL (ref 6.0–8.3)

## 2013-09-27 LAB — ABO/RH: ABO/RH(D): O POS

## 2013-09-27 LAB — CBC
HCT: 38.1 % — ABNORMAL LOW (ref 39.0–52.0)
Hemoglobin: 12.4 g/dL — ABNORMAL LOW (ref 13.0–17.0)
MCH: 27.5 pg (ref 26.0–34.0)
MCHC: 32.5 g/dL (ref 30.0–36.0)
MCV: 84.5 fL (ref 78.0–100.0)
PLATELETS: 151 10*3/uL (ref 150–400)
RBC: 4.51 MIL/uL (ref 4.22–5.81)
RDW: 14 % (ref 11.5–15.5)
WBC: 4.7 10*3/uL (ref 4.0–10.5)

## 2013-09-27 LAB — URINALYSIS, ROUTINE W REFLEX MICROSCOPIC
Bilirubin Urine: NEGATIVE
Glucose, UA: NEGATIVE mg/dL
HGB URINE DIPSTICK: NEGATIVE
KETONES UR: NEGATIVE mg/dL
LEUKOCYTES UA: NEGATIVE
Nitrite: NEGATIVE
Protein, ur: NEGATIVE mg/dL
SPECIFIC GRAVITY, URINE: 1.017 (ref 1.005–1.030)
Urobilinogen, UA: 0.2 mg/dL (ref 0.0–1.0)
pH: 5.5 (ref 5.0–8.0)

## 2013-09-27 LAB — APTT: aPTT: 29 seconds (ref 24–37)

## 2013-09-27 LAB — SURGICAL PCR SCREEN
MRSA, PCR: INVALID — AB
Staphylococcus aureus: INVALID — AB

## 2013-09-27 LAB — PROTIME-INR
INR: 1 (ref 0.00–1.49)
Prothrombin Time: 13 seconds (ref 11.6–15.2)

## 2013-09-29 ENCOUNTER — Ambulatory Visit: Payer: Medicare Other | Admitting: Podiatry

## 2013-09-29 LAB — MRSA CULTURE

## 2013-09-30 ENCOUNTER — Other Ambulatory Visit: Payer: Self-pay | Admitting: Orthopedic Surgery

## 2013-09-30 NOTE — H&P (Signed)
Larena Glassman DOB: [birth year] Married / Language: English / Race: Black or African American Male  Date of Admission:  10-04-2013  Chief Complaint:  Right Knee Pain  History of Present Illness The patient is a 80 year old male who comes in for a preoperative History and Physical. The patient is scheduled for a right total knee arthroplasty to be performed by Dr. Dione Plover. Aluisio, MD at [facility] on 10-04-2013. The patient is a 80 year old male who presents today for follow up of their knee. The patient is being followed for their right knee pain and osteoarthritis. They are now several months out from Synvisc series. Symptoms reported today include: pain, aching and difficulty ambulating. The patient feels that they are doing poorly and report their pain level to be moderate to severe. Current treatment includes: NSAIDs (ibuprofen) and icing. The patient has not gotten any relief of their symptoms with viscosupplementation. The patient indicates that they have questions or concerns today regarding surgery. Mr. Bazil is at a stage now where the injections do not help anymore. The knee is hurting at all times. It is especially bad with activity. Generally he can still sleep well, but he is having more difficulties with functional activities. He would like to be more active, but the knee is preventing him from doing so. He would like to proceed with surgery for the right knee at this time. They have been treated conservatively in the past for the above stated problem and despite conservative measures, they continue to have progressive pain and severe functional limitations and dysfunction. They have failed non-operative management including home exercise, medications, and injections. It is felt that they would benefit from undergoing total joint replacement. Risks and benefits of the procedure have been discussed with the patient and they elect to proceed with surgery. There are  no active contraindications to surgery such as ongoing infection or rapidly progressive neurological disease.   Allergies No Known Drug Allergies   Problem List/Past Medical Primary osteoarthritis of one knee (715.16) High blood pressure Sleep Apnea. uses CPAP Shingles    Family History Cancer. Mother. mother    Social History Exercise. Exercises weekly; does other Drug/Alcohol Rehab (Previously). no Illicit drug use. no Current work status. retired Tobacco / smoke exposure. yes outdoors only Marital status. married Pain Contract. no Tobacco use. Former smoker. former smoker; uses 1 1/2 can(s) smokeless per week Drug/Alcohol Rehab (Currently). no Living situation. live with spouse Alcohol use. current drinker; drinks beer; only occasionally per week Post-Surgical Plans. [facility]    Medication History Hydrochlorothiazide (25MG  Tablet, Oral) Active. Losartan Potassium (25MG  Tablet, Oral) Active. Aspirin EC (81MG  Tablet DR, Oral) Active. Atorvastatin Calcium (80MG  Tablet, Oral) Active. Vitamin D ( Oral) Specific dose unknown - Active. Fish Oil Active. Ibuprofen (200MG  Tablet, Oral as needed) Active.    Past Surgical  No pertinent past surgical history    Review of Systems General:Not Present- Chills, Fever, Night Sweats, Fatigue, Weight Gain, Weight Loss and Memory Loss. Skin:Not Present- Hives, Itching, Rash, Eczema and Lesions. HEENT:Not Present- Tinnitus, Headache, Double Vision, Visual Loss, Hearing Loss and Dentures. Respiratory:Present- Wheezing (occassional if gets nasal congestion but none recently). Not Present- Shortness of breath with exertion, Shortness of breath at rest, Allergies, Coughing up blood and Chronic Cough. Cardiovascular:Not Present- Chest Pain, Racing/skipping heartbeats, Difficulty Breathing Lying Down, Murmur, Swelling and Palpitations. Gastrointestinal:Not Present- Bloody Stool, Heartburn,  Abdominal Pain, Vomiting, Nausea, Constipation, Diarrhea, Difficulty Swallowing, Jaundice and Loss of appetitie. Male Genitourinary:Present- Urinating  at Night. Not Present- Urinary frequency, Blood in Urine, Weak urinary stream, Discharge, Flank Pain, Incontinence, Painful Urination, Urgency and Urinary Retention. Musculoskeletal:Present- Joint Pain. Not Present- Muscle Weakness, Muscle Pain, Joint Swelling, Back Pain, Morning Stiffness and Spasms. Neurological:Not Present- Tremor, Dizziness, Blackout spells, Paralysis, Difficulty with balance and Weakness. Psychiatric:Not Present- Insomnia.    Vitals Weight: 211 lb Height: 66 in Weight was reported by patient. Height was reported by patient. Body Surface Area: 2.11 m Body Mass Index: 34.06 kg/m Pulse: 60 (Regular) Resp.: 14 (Unlabored) BP: 148/76 (Sitting, Left Arm, Standard)   Physical Exam The physical exam findings are as follows:  Note: Patient is an 80 year old male with continued knee pain. Patient is accompanied today by his wife [name].   General Mental Status - Alert, cooperative and good historian. General Appearance- pleasant. Not in acute distress. Orientation- Oriented X3. Build & Nutrition- Well nourished and Well developed.   Head and Neck Head- normocephalic, atraumatic . Neck Global Assessment- supple. no bruit auscultated on the right and no bruit auscultated on the left.   Eye Pupil- Bilateral- Regular and Round. Motion- Bilateral- EOMI.   Chest and Lung Exam Auscultation: Breath sounds:- clear at anterior chest wall and - clear at posterior chest wall. Adventitious sounds:- No Adventitious sounds.   Cardiovascular Auscultation:Rhythm- Regular rate and rhythm. Heart Sounds- S1 WNL and S2 WNL. Murmurs & Other Heart Sounds:Auscultation of the heart reveals - No Murmurs.   Abdomen Palpation/Percussion:Tenderness- Abdomen is non-tender to palpation.  Rigidity (guarding)- Abdomen is soft. Auscultation:Auscultation of the abdomen reveals - Bowel sounds normal.   Male Genitourinary Not done, not pertinent to present illness  Musculoskeletal On exam he is alert and oriented in no apparent distress. His hips show normal range of motion and no discomfort. The left knee no effusion. Range about 0 to 125. Moderate crepitus on range of motion, no specific joint line tenderness or instability. The right knee significant valgus deformity. There is no effusion. Range on the right is about 5 to 125 with marked crepitus on range of motion, tender lateral greater than medial with no instability noted.  RADIOGRAPHS: AP both knees and lateral show severe endstage arthritis of the right knee, bone on bone lateral and patellofemoral with significant narrowing medially also. He has about a 15 degree valgus deformity. The left knee shows degeneration also, but to a much lesser degree.   Assessment & Plan Primary osteoarthritis of one knee (715.16) Impression: Right Knee  Note: Plan is for a Right Total Knee Replacement by Dr. Wynelle Link.  Plan is to go to [facility]  PCP - Dr. Willey Blade  The patient does not have any contraindications and will receive TXA (tranexamic acid) prior to surgery.  Signed electronically by Joelene Millin, III PA-C

## 2013-10-04 ENCOUNTER — Inpatient Hospital Stay (HOSPITAL_COMMUNITY)
Admission: RE | Admit: 2013-10-04 | Discharge: 2013-10-07 | DRG: 470 | Disposition: A | Discharge status: Skilled Nursing Facility | Payer: Medicare Other | Source: Ambulatory Visit | Attending: Orthopedic Surgery | Admitting: Orthopedic Surgery

## 2013-10-04 ENCOUNTER — Inpatient Hospital Stay (HOSPITAL_COMMUNITY): Payer: Medicare Other | Admitting: Certified Registered Nurse Anesthetist

## 2013-10-04 ENCOUNTER — Encounter (HOSPITAL_COMMUNITY)
Admission: RE | Disposition: A | Discharge status: Skilled Nursing Facility | Payer: Self-pay | Source: Ambulatory Visit | Attending: Orthopedic Surgery

## 2013-10-04 ENCOUNTER — Encounter (HOSPITAL_COMMUNITY): Payer: Medicare Other | Admitting: Certified Registered Nurse Anesthetist

## 2013-10-04 ENCOUNTER — Encounter (HOSPITAL_COMMUNITY): Payer: Self-pay | Admitting: *Deleted

## 2013-10-04 DIAGNOSIS — Z87891 Personal history of nicotine dependence: Secondary | ICD-10-CM

## 2013-10-04 DIAGNOSIS — Z96659 Presence of unspecified artificial knee joint: Secondary | ICD-10-CM

## 2013-10-04 DIAGNOSIS — J45909 Unspecified asthma, uncomplicated: Secondary | ICD-10-CM | POA: Diagnosis present

## 2013-10-04 DIAGNOSIS — G4733 Obstructive sleep apnea (adult) (pediatric): Secondary | ICD-10-CM | POA: Diagnosis present

## 2013-10-04 DIAGNOSIS — M1710 Unilateral primary osteoarthritis, unspecified knee: Principal | ICD-10-CM | POA: Diagnosis present

## 2013-10-04 DIAGNOSIS — D62 Acute posthemorrhagic anemia: Secondary | ICD-10-CM

## 2013-10-04 DIAGNOSIS — Z6834 Body mass index (BMI) 34.0-34.9, adult: Secondary | ICD-10-CM

## 2013-10-04 DIAGNOSIS — K5900 Constipation, unspecified: Secondary | ICD-10-CM | POA: Diagnosis present

## 2013-10-04 DIAGNOSIS — Z79899 Other long term (current) drug therapy: Secondary | ICD-10-CM

## 2013-10-04 DIAGNOSIS — I10 Essential (primary) hypertension: Secondary | ICD-10-CM | POA: Diagnosis present

## 2013-10-04 DIAGNOSIS — M179 Osteoarthritis of knee, unspecified: Secondary | ICD-10-CM | POA: Diagnosis present

## 2013-10-04 HISTORY — PX: TOTAL KNEE ARTHROPLASTY: SHX125

## 2013-10-04 LAB — TYPE AND SCREEN
ABO/RH(D): O POS
Antibody Screen: NEGATIVE

## 2013-10-04 SURGERY — ARTHROPLASTY, KNEE, TOTAL
Anesthesia: Spinal | Site: Knee | Laterality: Right

## 2013-10-04 MED ORDER — DEXAMETHASONE SODIUM PHOSPHATE 10 MG/ML IJ SOLN
10.0000 mg | Freq: Every day | INTRAMUSCULAR | Status: AC
  Filled 2013-10-04: qty 1

## 2013-10-04 MED ORDER — FLUTICASONE PROPIONATE 50 MCG/ACT NA SUSP
2.0000 | Freq: Every day | NASAL | Status: DC | PRN
  Filled 2013-10-04: qty 16

## 2013-10-04 MED ORDER — LIDOCAINE HCL (CARDIAC) 20 MG/ML IV SOLN
INTRAVENOUS | Status: DC | PRN
  Administered 2013-10-04: 100 mg via INTRAVENOUS

## 2013-10-04 MED ORDER — CEFAZOLIN SODIUM-DEXTROSE 2-3 GM-% IV SOLR
2.0000 g | Freq: Four times a day (QID) | INTRAVENOUS | Status: AC
  Administered 2013-10-04 – 2013-10-05 (×2): 2 g via INTRAVENOUS
  Filled 2013-10-04 (×2): qty 50

## 2013-10-04 MED ORDER — FENTANYL CITRATE 0.05 MG/ML IJ SOLN: 25.0000 ug | INTRAMUSCULAR | Status: DC | PRN

## 2013-10-04 MED ORDER — MEPERIDINE HCL 50 MG/ML IJ SOLN: 6.2500 mg | INTRAMUSCULAR | Status: DC | PRN

## 2013-10-04 MED ORDER — EPHEDRINE SULFATE 50 MG/ML IJ SOLN
INTRAMUSCULAR | Status: AC
  Filled 2013-10-04: qty 1

## 2013-10-04 MED ORDER — LOSARTAN POTASSIUM 50 MG PO TABS
100.0000 mg | ORAL_TABLET | Freq: Every morning | ORAL | Status: DC
  Administered 2013-10-05 – 2013-10-07 (×3): 100 mg via ORAL
  Filled 2013-10-04 (×3): qty 2

## 2013-10-04 MED ORDER — POLYETHYLENE GLYCOL 3350 17 G PO PACK
17.0000 g | PACK | Freq: Every day | ORAL | Status: DC | PRN
  Administered 2013-10-06: 17 g via ORAL

## 2013-10-04 MED ORDER — BUPIVACAINE LIPOSOME 1.3 % IJ SUSP
20.0000 mL | Freq: Once | INTRAMUSCULAR | Status: DC
  Filled 2013-10-04: qty 20

## 2013-10-04 MED ORDER — DIPHENHYDRAMINE HCL 12.5 MG/5ML PO ELIX: 12.5000 mg | ORAL_SOLUTION | ORAL | Status: DC | PRN

## 2013-10-04 MED ORDER — HYDROCHLOROTHIAZIDE 25 MG PO TABS
25.0000 mg | ORAL_TABLET | Freq: Every day | ORAL | Status: DC
  Administered 2013-10-04 – 2013-10-07 (×4): 25 mg via ORAL
  Filled 2013-10-04 (×4): qty 1

## 2013-10-04 MED ORDER — PROPOFOL 10 MG/ML IV BOLUS
INTRAVENOUS | Status: AC
  Filled 2013-10-04: qty 20

## 2013-10-04 MED ORDER — LIDOCAINE HCL (CARDIAC) 20 MG/ML IV SOLN
INTRAVENOUS | Status: AC
  Filled 2013-10-04: qty 5

## 2013-10-04 MED ORDER — ONDANSETRON HCL 4 MG PO TABS: 4.0000 mg | ORAL_TABLET | Freq: Four times a day (QID) | ORAL | Status: DC | PRN

## 2013-10-04 MED ORDER — METOCLOPRAMIDE HCL 5 MG/ML IJ SOLN: 5.0000 mg | Freq: Three times a day (TID) | INTRAMUSCULAR | Status: DC | PRN

## 2013-10-04 MED ORDER — BUPIVACAINE HCL (PF) 0.25 % IJ SOLN
INTRAMUSCULAR | Status: AC
  Filled 2013-10-04: qty 30

## 2013-10-04 MED ORDER — DOCUSATE SODIUM 100 MG PO CAPS
100.0000 mg | ORAL_CAPSULE | Freq: Two times a day (BID) | ORAL | Status: DC
  Administered 2013-10-04 – 2013-10-07 (×6): 100 mg via ORAL

## 2013-10-04 MED ORDER — ONDANSETRON HCL 4 MG/2ML IJ SOLN: 4.0000 mg | Freq: Four times a day (QID) | INTRAMUSCULAR | Status: DC | PRN

## 2013-10-04 MED ORDER — GLYCOPYRROLATE 0.2 MG/ML IJ SOLN
INTRAMUSCULAR | Status: DC | PRN
  Administered 2013-10-04: 0.2 mg via INTRAVENOUS

## 2013-10-04 MED ORDER — METHOCARBAMOL 500 MG PO TABS
500.0000 mg | ORAL_TABLET | Freq: Four times a day (QID) | ORAL | Status: DC | PRN
  Administered 2013-10-04 – 2013-10-06 (×4): 500 mg via ORAL
  Filled 2013-10-04 (×4): qty 1

## 2013-10-04 MED ORDER — PHENYLEPHRINE 40 MCG/ML (10ML) SYRINGE FOR IV PUSH (FOR BLOOD PRESSURE SUPPORT)
PREFILLED_SYRINGE | INTRAVENOUS | Status: AC
  Filled 2013-10-04: qty 10

## 2013-10-04 MED ORDER — KCL IN DEXTROSE-NACL 20-5-0.9 MEQ/L-%-% IV SOLN
INTRAVENOUS | Status: DC
  Administered 2013-10-04 – 2013-10-05 (×2): via INTRAVENOUS
  Filled 2013-10-04 (×6): qty 1000

## 2013-10-04 MED ORDER — KCL IN DEXTROSE-NACL 20-5-0.9 MEQ/L-%-% IV SOLN
INTRAVENOUS | Status: AC
  Filled 2013-10-04: qty 1000

## 2013-10-04 MED ORDER — PHENOL 1.4 % MT LIQD
1.0000 | OROMUCOSAL | Status: DC | PRN
  Filled 2013-10-04: qty 177

## 2013-10-04 MED ORDER — DEXAMETHASONE SODIUM PHOSPHATE 10 MG/ML IJ SOLN
10.0000 mg | Freq: Once | INTRAMUSCULAR | Status: AC
  Administered 2013-10-04: 10 mg via INTRAVENOUS

## 2013-10-04 MED ORDER — PHENYLEPHRINE HCL 10 MG/ML IJ SOLN
INTRAMUSCULAR | Status: DC | PRN
  Administered 2013-10-04: 80 ug via INTRAVENOUS

## 2013-10-04 MED ORDER — ONDANSETRON HCL 4 MG/2ML IJ SOLN
INTRAMUSCULAR | Status: DC | PRN
  Administered 2013-10-04: 4 mg via INTRAVENOUS

## 2013-10-04 MED ORDER — BUPIVACAINE HCL 0.25 % IJ SOLN
INTRAMUSCULAR | Status: DC | PRN
  Administered 2013-10-04: 25 mL

## 2013-10-04 MED ORDER — BUPIVACAINE LIPOSOME 1.3 % IJ SUSP
INTRAMUSCULAR | Status: DC | PRN
  Administered 2013-10-04: 20 mL

## 2013-10-04 MED ORDER — ACETAMINOPHEN 500 MG PO TABS
1000.0000 mg | ORAL_TABLET | Freq: Four times a day (QID) | ORAL | Status: AC
  Administered 2013-10-04 – 2013-10-05 (×3): 1000 mg via ORAL
  Filled 2013-10-04 (×4): qty 2

## 2013-10-04 MED ORDER — SODIUM CHLORIDE 0.9 % IJ SOLN
INTRAMUSCULAR | Status: AC
  Filled 2013-10-04: qty 10

## 2013-10-04 MED ORDER — ACETAMINOPHEN 650 MG RE SUPP: 650.0000 mg | Freq: Four times a day (QID) | RECTAL | Status: DC | PRN

## 2013-10-04 MED ORDER — CEFAZOLIN SODIUM-DEXTROSE 2-3 GM-% IV SOLR
2.0000 g | INTRAVENOUS | Status: AC
  Administered 2013-10-04: 2 g via INTRAVENOUS

## 2013-10-04 MED ORDER — METOCLOPRAMIDE HCL 10 MG PO TABS: 5.0000 mg | ORAL_TABLET | Freq: Three times a day (TID) | ORAL | Status: DC | PRN

## 2013-10-04 MED ORDER — BISACODYL 10 MG RE SUPP: 10.0000 mg | Freq: Every day | RECTAL | Status: DC | PRN

## 2013-10-04 MED ORDER — TRANEXAMIC ACID 100 MG/ML IV SOLN
1000.0000 mg | INTRAVENOUS | Status: AC
  Administered 2013-10-04: 1000 mg via INTRAVENOUS
  Filled 2013-10-04: qty 10

## 2013-10-04 MED ORDER — ACETAMINOPHEN 10 MG/ML IV SOLN
1000.0000 mg | Freq: Once | INTRAVENOUS | Status: AC
  Administered 2013-10-04: 1000 mg via INTRAVENOUS
  Filled 2013-10-04: qty 100

## 2013-10-04 MED ORDER — CEFAZOLIN SODIUM-DEXTROSE 2-3 GM-% IV SOLR
INTRAVENOUS | Status: AC
  Filled 2013-10-04: qty 50

## 2013-10-04 MED ORDER — PROMETHAZINE HCL 25 MG/ML IJ SOLN: 6.2500 mg | INTRAMUSCULAR | Status: DC | PRN

## 2013-10-04 MED ORDER — DEXAMETHASONE 4 MG PO TABS
10.0000 mg | ORAL_TABLET | Freq: Every day | ORAL | Status: AC
  Administered 2013-10-05: 10 mg via ORAL
  Filled 2013-10-04: qty 1

## 2013-10-04 MED ORDER — MENTHOL 3 MG MT LOZG
1.0000 | LOZENGE | OROMUCOSAL | Status: DC | PRN
  Administered 2013-10-05: 3 mg via ORAL
  Filled 2013-10-04: qty 9

## 2013-10-04 MED ORDER — FLEET ENEMA 7-19 GM/118ML RE ENEM
1.0000 | ENEMA | Freq: Once | RECTAL | Status: AC | PRN
Start: 2013-10-04 — End: 2013-10-04

## 2013-10-04 MED ORDER — OXYCODONE HCL 5 MG PO TABS
5.0000 mg | ORAL_TABLET | ORAL | Status: DC | PRN
  Administered 2013-10-04 – 2013-10-07 (×15): 10 mg via ORAL
  Filled 2013-10-04 (×16): qty 2

## 2013-10-04 MED ORDER — DEXAMETHASONE SODIUM PHOSPHATE 10 MG/ML IJ SOLN
INTRAMUSCULAR | Status: AC
  Filled 2013-10-04: qty 1

## 2013-10-04 MED ORDER — GLYCOPYRROLATE 0.2 MG/ML IJ SOLN
INTRAMUSCULAR | Status: AC
  Filled 2013-10-04: qty 1

## 2013-10-04 MED ORDER — ONDANSETRON HCL 4 MG/2ML IJ SOLN
INTRAMUSCULAR | Status: AC
  Filled 2013-10-04: qty 2

## 2013-10-04 MED ORDER — LACTATED RINGERS IV SOLN
INTRAVENOUS | Status: DC
  Administered 2013-10-04: 1000 mL via INTRAVENOUS
  Administered 2013-10-04: 12:00:00 via INTRAVENOUS

## 2013-10-04 MED ORDER — TRAMADOL HCL 50 MG PO TABS
50.0000 mg | ORAL_TABLET | Freq: Four times a day (QID) | ORAL | Status: DC | PRN
  Administered 2013-10-05: 50 mg via ORAL
  Filled 2013-10-04: qty 1

## 2013-10-04 MED ORDER — ATORVASTATIN CALCIUM 80 MG PO TABS
80.0000 mg | ORAL_TABLET | Freq: Every day | ORAL | Status: DC
  Administered 2013-10-04 – 2013-10-06 (×3): 80 mg via ORAL
  Filled 2013-10-04 (×4): qty 1

## 2013-10-04 MED ORDER — METHOCARBAMOL 100 MG/ML IJ SOLN
500.0000 mg | Freq: Four times a day (QID) | INTRAVENOUS | Status: DC | PRN
  Filled 2013-10-04: qty 5

## 2013-10-04 MED ORDER — ACETAMINOPHEN 325 MG PO TABS
650.0000 mg | ORAL_TABLET | Freq: Four times a day (QID) | ORAL | Status: DC | PRN
  Filled 2013-10-04: qty 2

## 2013-10-04 MED ORDER — EPHEDRINE SULFATE 50 MG/ML IJ SOLN
INTRAMUSCULAR | Status: DC | PRN
  Administered 2013-10-04 (×4): 10 mg via INTRAVENOUS

## 2013-10-04 MED ORDER — PROPOFOL INFUSION 10 MG/ML OPTIME
INTRAVENOUS | Status: DC | PRN
Start: 2013-10-04 — End: 2013-10-04
  Administered 2013-10-04: 120 ug/kg/min via INTRAVENOUS

## 2013-10-04 MED ORDER — SODIUM CHLORIDE 0.9 % IJ SOLN
INTRAMUSCULAR | Status: AC
  Filled 2013-10-04: qty 50

## 2013-10-04 MED ORDER — SODIUM CHLORIDE 0.9 % IJ SOLN
INTRAMUSCULAR | Status: DC | PRN
  Administered 2013-10-04: 30 mL

## 2013-10-04 MED ORDER — MORPHINE SULFATE 2 MG/ML IJ SOLN
1.0000 mg | INTRAMUSCULAR | Status: DC | PRN
  Administered 2013-10-05: 1 mg via INTRAVENOUS
  Filled 2013-10-04: qty 1

## 2013-10-04 MED ORDER — SODIUM CHLORIDE 0.9 % IV SOLN: INTRAVENOUS | Status: DC

## 2013-10-04 MED ORDER — RIVAROXABAN 10 MG PO TABS
10.0000 mg | ORAL_TABLET | Freq: Every day | ORAL | Status: DC
  Administered 2013-10-05 – 2013-10-07 (×3): 10 mg via ORAL
  Filled 2013-10-04 (×4): qty 1

## 2013-10-04 SURGICAL SUPPLY — 61 items
BAG SPEC THK2 15X12 ZIP CLS (MISCELLANEOUS) ×1
BAG ZIPLOCK 12X15 (MISCELLANEOUS) ×3 IMPLANT
BANDAGE ELASTIC 6 VELCRO ST LF (GAUZE/BANDAGES/DRESSINGS) ×3 IMPLANT
BANDAGE ESMARK 6X9 LF (GAUZE/BANDAGES/DRESSINGS) ×1 IMPLANT
BLADE SAG 18X100X1.27 (BLADE) ×3 IMPLANT
BLADE SAW SGTL 11.0X1.19X90.0M (BLADE) ×3 IMPLANT
BNDG CMPR 9X6 STRL LF SNTH (GAUZE/BANDAGES/DRESSINGS) ×1
BNDG ESMARK 6X9 LF (GAUZE/BANDAGES/DRESSINGS) ×3
BOWL SMART MIX CTS (DISPOSABLE) ×3 IMPLANT
CAPT RP KNEE ×2 IMPLANT
CEMENT HV SMART SET (Cement) ×4 IMPLANT
CLOSURE WOUND 1/2 X4 (GAUZE/BANDAGES/DRESSINGS) ×1
CUFF TOURN SGL QUICK 34 (TOURNIQUET CUFF) ×3
CUFF TRNQT CYL 34X4X40X1 (TOURNIQUET CUFF) ×1 IMPLANT
DECANTER SPIKE VIAL GLASS SM (MISCELLANEOUS) ×3 IMPLANT
DRAPE EXTREMITY T 121X128X90 (DRAPE) ×3 IMPLANT
DRAPE POUCH INSTRU U-SHP 10X18 (DRAPES) ×3 IMPLANT
DRAPE U-SHAPE 47X51 STRL (DRAPES) ×3 IMPLANT
DRSG ADAPTIC 3X8 NADH LF (GAUZE/BANDAGES/DRESSINGS) ×3 IMPLANT
DRSG PAD ABDOMINAL 8X10 ST (GAUZE/BANDAGES/DRESSINGS) ×3 IMPLANT
DURAPREP 26ML APPLICATOR (WOUND CARE) ×3 IMPLANT
ELECT REM PT RETURN 9FT ADLT (ELECTROSURGICAL) ×3
ELECTRODE REM PT RTRN 9FT ADLT (ELECTROSURGICAL) ×1 IMPLANT
EVACUATOR 1/8 PVC DRAIN (DRAIN) ×3 IMPLANT
FACESHIELD WRAPAROUND (MASK) ×15 IMPLANT
FACESHIELD WRAPAROUND OR TEAM (MASK) ×5 IMPLANT
GLOVE BIO SURGEON STRL SZ7.5 (GLOVE) IMPLANT
GLOVE BIO SURGEON STRL SZ8 (GLOVE) ×3 IMPLANT
GLOVE BIOGEL PI IND STRL 8 (GLOVE) ×2 IMPLANT
GLOVE BIOGEL PI INDICATOR 8 (GLOVE) ×4
GLOVE SURG SS PI 6.5 STRL IVOR (GLOVE) IMPLANT
GOWN STRL REUS W/TWL LRG LVL3 (GOWN DISPOSABLE) ×3 IMPLANT
GOWN STRL REUS W/TWL XL LVL3 (GOWN DISPOSABLE) IMPLANT
HANDPIECE INTERPULSE COAX TIP (DISPOSABLE) ×3
IMMOBILIZER KNEE 20 (SOFTGOODS) ×3 IMPLANT
KIT BASIN OR (CUSTOM PROCEDURE TRAY) ×3 IMPLANT
MANIFOLD NEPTUNE II (INSTRUMENTS) ×3 IMPLANT
NDL SAFETY ECLIPSE 18X1.5 (NEEDLE) ×2 IMPLANT
NEEDLE HYPO 18GX1.5 SHARP (NEEDLE) ×6
NS IRRIG 1000ML POUR BTL (IV SOLUTION) ×3 IMPLANT
PACK TOTAL JOINT (CUSTOM PROCEDURE TRAY) ×3 IMPLANT
PAD ABD 8X10 STRL (GAUZE/BANDAGES/DRESSINGS) ×2 IMPLANT
PADDING CAST COTTON 6X4 STRL (CAST SUPPLIES) ×5 IMPLANT
POSITIONER SURGICAL ARM (MISCELLANEOUS) ×3 IMPLANT
SET HNDPC FAN SPRY TIP SCT (DISPOSABLE) ×1 IMPLANT
SPONGE GAUZE 4X4 12PLY (GAUZE/BANDAGES/DRESSINGS) ×3 IMPLANT
STRIP CLOSURE SKIN 1/2X4 (GAUZE/BANDAGES/DRESSINGS) ×3 IMPLANT
SUCTION FRAZIER 12FR DISP (SUCTIONS) ×3 IMPLANT
SUT MNCRL AB 4-0 PS2 18 (SUTURE) ×3 IMPLANT
SUT PDS AB 1 CT1 27 (SUTURE) ×2 IMPLANT
SUT VIC AB 2-0 CT1 27 (SUTURE) ×9
SUT VIC AB 2-0 CT1 TAPERPNT 27 (SUTURE) ×3 IMPLANT
SUT VLOC 180 0 24IN GS25 (SUTURE) ×3 IMPLANT
SYR 20CC LL (SYRINGE) ×3 IMPLANT
SYR 50ML LL SCALE MARK (SYRINGE) ×3 IMPLANT
TOWEL OR 17X26 10 PK STRL BLUE (TOWEL DISPOSABLE) ×3 IMPLANT
TOWEL OR NON WOVEN STRL DISP B (DISPOSABLE) IMPLANT
TRAY FOLEY CATH 14FRSI W/METER (CATHETERS) ×1 IMPLANT
TRAY FOLEY CATH 16FRSI W/METER (SET/KITS/TRAYS/PACK) ×2 IMPLANT
WATER STERILE IRR 1500ML POUR (IV SOLUTION) ×3 IMPLANT
WRAP KNEE MAXI GEL POST OP (GAUZE/BANDAGES/DRESSINGS) ×3 IMPLANT

## 2013-10-04 NOTE — Op Note (Signed)
Pre-operative diagnosis- Osteoarthritis Right knee(s)  Post-operative diagnosis- Osteoarthritis  Right knee(s)  Procedure-   Right Total Knee Arthroplasty  Surgeon- Christopher Plover. Dela Sweeny, MD  Assistant- Amber constable, PA-C   Anesthesia-  Spinal   EBL- * No blood loss amount entered *   Drains Hemovac   Tourniquet time  Total Tourniquet Time Documented: Thigh (Right) - 30 minutes Total: Thigh (Right) - 30 minutes    Complications- None  Condition-PACU - hemodynamically stable.   Brief Clinical Note  Christopher Shepherd is a 80 y.o. year old male with end stage OA of his right knee with progressively worsening pain and dysfunction. He has constant pain, with activity and at rest and significant functional deficits with difficulties even with ADLs. He has had extensive non-op management including analgesics, injections of cortisone and viscosupplements, and home exercise program, but remains in significant pain with significant dysfunction. He has bone on bone lateral and patellofemoral with large valgus deformity. He presents now for rightt Total Knee Arthroplasty.   Procedure in detail---       The patient is brought into the operating room and positioned supine on the operating table. After successful administration of Spinal anesthetic, a tourniquet is placed high on the Right thigh(s) and the lower extremity is prepped and draped in the usual sterile fashion. Time out is performed by the operating team and then the Right  lower extremity is wrapped in Esmarch, knee flexed and the tourniquet inflated to 300 mmHg.       A midline incision is made with a ten blade through the subcutaneous tissue to the level of the extensor mechanism. A fresh blade is used to make a lateral parapatellar arthrotomy due to the patients' valgus deformity. Soft tissue over the proximal lateral tibia is subperiosteally elevated to the joint line with a knife to the posterolateral corner but not including the  structures of the posterolateral corner. Soft tissue over the proximal medial tibia is elevated with attention being paid to avoiding the patellar tendon on the tibial tubercle. The patella is everted medially, knee flexed 90 degrees and the ACL and PCL are removed. Findings are bone on bone lateral and patellofemoral with large lateral osteophytes. .       The drill is used to create a starting hole in the distal femur and the canal is thoroughly irrigated with sterile saline to remove the fatty contents. The 5 degree Right  valgus alignment guide is placed into the femoral canal and the distal femoral cutting block is pinned to remove 11  mm off the distal femur. Resection is made with an oscillating saw.      The tibia is subluxed forward and the menisci are removed. The extramedullary alignment guide is placed referencing proximally at the medial aspect of the tibial tubercle and distally along the second metatarsal axis and tibial crest. The block is pinned to remove 31mm off the more deficient lateral side. Resection is made with an oscillating saw. Size 5  is the most appropriate size for the tibia and the proximal tibia is prepared with the modular drill and keel punch for that size.      The femoral sizing guide is placed and size 5  is most appropriate. Rotation is marked off the epicondylar axis and confirmed by creating a rectangular flexion gap at 90 degrees. The size 5  cutting block is pinned in this rotation and the anterior, posterior and chamfer cuts are made with the oscillating saw. The intercondylar  block is then placed and that cut is made.      Trial size 5  tibial component, trial size 5  posterior stabilized femur and a 15  mm posterior stabilized rotating platform insert trial is placed. Full extension is achieved with excellent varus/valgus and   anterior/posterior balance throughout full range of motion. The patella is everted and thickness measured to be 27  mm. Free hand resection is  taken to 16 mm, a 38 template is placed, lug holes are drilled, trial patella is placed, and it tracks normally. Osteophytes are removed off the posterior femur with the trial in place. All trials are removed and the cut bone surfaces prepared with pulsatile lavage. Cement is mixed and once ready for implantation, the size 5  tibial implant, size 5 posterior stabilized femoral component, and the size 38  patella are cemented in place and the patella is held with the clamp. The trial insert is placed and the knee held in full extension. The Exparel (20 ml mixed with 30 ml saline) and then 20 ml of .25% Bupivicaine is injected into the extensor mechanism, posterior capsule, medial and lateral gutters and subcutaneous tissues. All extruded cement is removed and once the cement is hard the permanent 15  mm posterior stabilized rotating platform insert is placed into the tibial tray.      The wound is copiously irrigated with saline solution and the tourniquet is released for a total   tourniquet time of 30  minutes. Bleeding is identified and controlled with electrocautery. The extensor mechanism is closed with interrupted #1 PDS leaving open a small area from the superior to inferior pole of the patella to serve as a mini lateral release. Flexion against gravity is 135  degrees and the patella tracks normally. Subcutaneous tissue is closed with 2.0 vicryl and subcuticular with running 4.0 Monocryl.The incision is cleaned and dried and steri-strips and a bulky sterile dressing are applied. The limb is placed into a knee immobilizer and the patient is awakened and transported to recovery in stable condition.      Please note that a surgical assistant was a medical necessity for this procedure in order to perform it in a safe and expeditious manner. Surgical assistant was necessary to retract the ligaments and vital neurovascular structures to prevent injury to them and also necessary for proper positioning of the limb  to allow for anatomic placement of the prosthesis.    Christopher Plover Amarea Macdowell, MD    10/04/2013, 1:09 PM

## 2013-10-04 NOTE — Anesthesia Preprocedure Evaluation (Addendum)
Anesthesia Evaluation  Patient identified by MRN, date of birth, ID band Patient awake    Reviewed: Allergy & Precautions, H&P , NPO status , Patient's Chart, lab work & pertinent test results  Airway Mallampati: II TM Distance: >3 FB Neck ROM: Full    Dental no notable dental hx.    Pulmonary sleep apnea and Continuous Positive Airway Pressure Ventilation , former smoker,  breath sounds clear to auscultation  Pulmonary exam normal       Cardiovascular hypertension, Pt. on medications Rhythm:Regular Rate:Normal     Neuro/Psych negative neurological ROS  negative psych ROS   GI/Hepatic negative GI ROS, Neg liver ROS,   Endo/Other  negative endocrine ROS  Renal/GU negative Renal ROS  negative genitourinary   Musculoskeletal negative musculoskeletal ROS (+)   Abdominal   Peds negative pediatric ROS (+)  Hematology negative hematology ROS (+)   Anesthesia Other Findings   Reproductive/Obstetrics negative OB ROS                          Anesthesia Physical Anesthesia Plan  ASA: II  Anesthesia Plan: Spinal   Post-op Pain Management:    Induction: Intravenous  Airway Management Planned: Simple Face Mask  Additional Equipment:   Intra-op Plan:   Post-operative Plan:   Informed Consent: I have reviewed the patients History and Physical, chart, labs and discussed the procedure including the risks, benefits and alternatives for the proposed anesthesia with the patient or authorized representative who has indicated his/her understanding and acceptance.   Dental advisory given  Plan Discussed with: CRNA  Anesthesia Plan Comments:         Anesthesia Quick Evaluation

## 2013-10-04 NOTE — Transfer of Care (Signed)
Immediate Anesthesia Transfer of Care Note  Patient: Christopher Shepherd  Procedure(s) Performed: Procedure(s) (LRB): RIGHT TOTAL KNEE ARTHROPLASTY (Right)  Patient Location: PACU  Anesthesia Type: Spinal  Level of Consciousness: sedated, patient cooperative and responds to stimulation  Airway & Oxygen Therapy: Patient Spontanous Breathing and Patient connected to face mask oxgen  Post-op Assessment: Report given to PACU RN and Post -op Vital signs reviewed and stable  Post vital signs: Reviewed and stable  Complications: No apparent anesthesia complications

## 2013-10-04 NOTE — Progress Notes (Signed)
Utilization review completed.  

## 2013-10-04 NOTE — H&P (View-Only) (Signed)
Christopher Shepherd DOB: Dec 10, 1926 Married / Language: English / Race: Black or African American Male  Date of Admission:  10-04-2013  Chief Complaint:  Right Knee Pain  History of Present Illness The patient is a 80 year old male who comes in for a preoperative History and Physical. The patient is scheduled for a right total knee arthroplasty to be performed by Dr. Dione Plover. Aluisio, MD at [facility] on 10-04-2013. The patient is a 80 year old male who presents today for follow up of their knee. The patient is being followed for their right knee pain and osteoarthritis. They are now several months out from Synvisc series. Symptoms reported today include: pain, aching and difficulty ambulating. The patient feels that they are doing poorly and report their pain level to be moderate to severe. Current treatment includes: NSAIDs (ibuprofen) and icing. The patient has not gotten any relief of their symptoms with viscosupplementation. The patient indicates that they have questions or concerns today regarding surgery. Christopher Shepherd is at a stage now where the injections do not help anymore. The knee is hurting at all times. It is especially bad with activity. Generally he can still sleep well, but he is having more difficulties with functional activities. He would like to be more active, but the knee is preventing him from doing so. He would like to proceed with surgery for the right knee at this time. They have been treated conservatively in the past for the above stated problem and despite conservative measures, they continue to have progressive pain and severe functional limitations and dysfunction. They have failed non-operative management including home exercise, medications, and injections. It is felt that they would benefit from undergoing total joint replacement. Risks and benefits of the procedure have been discussed with the patient and they elect to proceed with surgery. There are  no active contraindications to surgery such as ongoing infection or rapidly progressive neurological disease.   Allergies No Known Drug Allergies   Problem List/Past Medical Primary osteoarthritis of one knee (715.16) High blood pressure Sleep Apnea. uses CPAP Shingles    Family History Cancer. Mother. mother    Social History Exercise. Exercises weekly; does other Drug/Alcohol Rehab (Previously). no Illicit drug use. no Current work status. retired Tobacco / smoke exposure. yes outdoors only Marital status. married Pain Contract. no Tobacco use. Former smoker. former smoker; uses 1 1/2 can(s) smokeless per week Drug/Alcohol Rehab (Currently). no Living situation. live with spouse Alcohol use. current drinker; drinks beer; only occasionally per week Post-Surgical Plans. Christopher Shepherd    Medication History Hydrochlorothiazide (25MG  Tablet, Oral) Active. Losartan Potassium (25MG  Tablet, Oral) Active. Aspirin EC (81MG  Tablet DR, Oral) Active. Atorvastatin Calcium (80MG  Tablet, Oral) Active. Vitamin D ( Oral) Specific dose unknown - Active. Fish Oil Active. Ibuprofen (200MG  Tablet, Oral as needed) Active.    Past Surgical  No pertinent past surgical history    Review of Systems General:Not Present- Chills, Fever, Night Sweats, Fatigue, Weight Gain, Weight Loss and Memory Loss. Skin:Not Present- Hives, Itching, Rash, Eczema and Lesions. HEENT:Not Present- Tinnitus, Headache, Double Vision, Visual Loss, Hearing Loss and Dentures. Respiratory:Present- Wheezing (occassional if gets nasal congestion but none recently). Not Present- Shortness of breath with exertion, Shortness of breath at rest, Allergies, Coughing up blood and Chronic Cough. Cardiovascular:Not Present- Chest Pain, Racing/skipping heartbeats, Difficulty Breathing Lying Down, Murmur, Swelling and Palpitations. Gastrointestinal:Not Present- Bloody Stool, Heartburn,  Abdominal Pain, Vomiting, Nausea, Constipation, Diarrhea, Difficulty Swallowing, Jaundice and Loss of appetitie. Male Genitourinary:Present- Urinating  at Night. Not Present- Urinary frequency, Blood in Urine, Weak urinary stream, Discharge, Flank Pain, Incontinence, Painful Urination, Urgency and Urinary Retention. Musculoskeletal:Present- Joint Pain. Not Present- Muscle Weakness, Muscle Pain, Joint Swelling, Back Pain, Morning Stiffness and Spasms. Neurological:Not Present- Tremor, Dizziness, Blackout spells, Paralysis, Difficulty with balance and Weakness. Psychiatric:Not Present- Insomnia.    Vitals Weight: 211 lb Height: 66 in Weight was reported by patient. Height was reported by patient. Body Surface Area: 2.11 m Body Mass Index: 34.06 kg/m Pulse: 60 (Regular) Resp.: 14 (Unlabored) BP: 148/76 (Sitting, Left Arm, Standard)   Physical Exam The physical exam findings are as follows:  Note: Patient is an 80 year old male with continued knee pain. Patient is accompanied today by his wife Christopher Shepherd.   General Mental Status - Alert, cooperative and good historian. General Appearance- pleasant. Not in acute distress. Orientation- Oriented X3. Build & Nutrition- Well nourished and Well developed.   Head and Neck Head- normocephalic, atraumatic . Neck Global Assessment- supple. no bruit auscultated on the right and no bruit auscultated on the left.   Eye Pupil- Bilateral- Regular and Round. Motion- Bilateral- EOMI.   Chest and Lung Exam Auscultation: Breath sounds:- clear at anterior chest wall and - clear at posterior chest wall. Adventitious sounds:- No Adventitious sounds.   Cardiovascular Auscultation:Rhythm- Regular rate and rhythm. Heart Sounds- S1 WNL and S2 WNL. Murmurs & Other Heart Sounds:Auscultation of the heart reveals - No Murmurs.   Abdomen Palpation/Percussion:Tenderness- Abdomen is non-tender to palpation.  Rigidity (guarding)- Abdomen is soft. Auscultation:Auscultation of the abdomen reveals - Bowel sounds normal.   Male Genitourinary Not done, not pertinent to present illness  Musculoskeletal On exam he is alert and oriented in no apparent distress. His hips show normal range of motion and no discomfort. The left knee no effusion. Range about 0 to 125. Moderate crepitus on range of motion, no specific joint line tenderness or instability. The right knee significant valgus deformity. There is no effusion. Range on the right is about 5 to 125 with marked crepitus on range of motion, tender lateral greater than medial with no instability noted.  RADIOGRAPHS: AP both knees and lateral show severe endstage arthritis of the right knee, bone on bone lateral and patellofemoral with significant narrowing medially also. He has about a 15 degree valgus deformity. The left knee shows degeneration also, but to a much lesser degree.   Assessment & Plan Primary osteoarthritis of one knee (715.16) Impression: Right Knee  Note: Plan is for a Right Total Knee Replacement by Dr. Wynelle Link.  Plan is to go to [facility]  PCP - Dr. Willey Blade  The patient does not have any contraindications and will receive TXA (tranexamic acid) prior to surgery.  Signed electronically by Joelene Millin, III PA-C

## 2013-10-04 NOTE — Anesthesia Postprocedure Evaluation (Signed)
  Anesthesia Post-op Note  Patient: Christopher Shepherd  Procedure(s) Performed: Procedure(s) (LRB): RIGHT TOTAL KNEE ARTHROPLASTY (Right)  Patient Location: PACU  Anesthesia Type: Spinal  Level of Consciousness: awake and alert   Airway and Oxygen Therapy: Patient Spontanous Breathing  Post-op Pain: mild  Post-op Assessment: Post-op Vital signs reviewed, Patient's Cardiovascular Status Stable, Respiratory Function Stable, Patent Airway and No signs of Nausea or vomiting  Last Vitals:  Filed Vitals:   10/04/13 1803  BP: 180/74  Pulse: 82  Temp: 36.6 C  Resp: 18    Post-op Vital Signs: stable   Complications: No apparent anesthesia complications

## 2013-10-04 NOTE — Progress Notes (Signed)
O.K. To go to floor with spinal level of L3-per Dr. Marcell Barlow.

## 2013-10-04 NOTE — Interval H&P Note (Signed)
History and Physical Interval Note:  10/04/2013 11:13 AM  Christopher Shepherd  has presented today for surgery, with the diagnosis of OA RIGHT KNEE   The various methods of treatment have been discussed with the patient and family. After consideration of risks, benefits and other options for treatment, the patient has consented to  Procedure(s): RIGHT TOTAL KNEE ARTHROPLASTY (Right) as a surgical intervention .  The patient's history has been reviewed, patient examined, no change in status, stable for surgery.  I have reviewed the patient's chart and labs.  Questions were answered to the patient's satisfaction.     Gearlean Alf

## 2013-10-05 LAB — CBC
HEMATOCRIT: 31.6 % — AB (ref 39.0–52.0)
Hemoglobin: 10.4 g/dL — ABNORMAL LOW (ref 13.0–17.0)
MCH: 27.7 pg (ref 26.0–34.0)
MCHC: 32.9 g/dL (ref 30.0–36.0)
MCV: 84 fL (ref 78.0–100.0)
Platelets: 139 10*3/uL — ABNORMAL LOW (ref 150–400)
RBC: 3.76 MIL/uL — AB (ref 4.22–5.81)
RDW: 14.3 % (ref 11.5–15.5)
WBC: 11.5 10*3/uL — ABNORMAL HIGH (ref 4.0–10.5)

## 2013-10-05 LAB — BASIC METABOLIC PANEL
BUN: 13 mg/dL (ref 6–23)
CHLORIDE: 106 meq/L (ref 96–112)
CO2: 24 mEq/L (ref 19–32)
CREATININE: 1.28 mg/dL (ref 0.50–1.35)
Calcium: 8.2 mg/dL — ABNORMAL LOW (ref 8.4–10.5)
GFR calc Af Amer: 58 mL/min — ABNORMAL LOW (ref >90)
GFR calc non Af Amer: 50 mL/min — ABNORMAL LOW (ref >90)
Glucose, Bld: 130 mg/dL — ABNORMAL HIGH (ref 70–99)
Potassium: 4.1 mEq/L (ref 3.7–5.3)
Sodium: 140 mEq/L (ref 137–147)

## 2013-10-05 MED ORDER — METHOCARBAMOL 500 MG PO TABS: 500.0000 mg | ORAL_TABLET | Freq: Four times a day (QID) | ORAL | Status: DC | PRN

## 2013-10-05 MED ORDER — OXYCODONE HCL 5 MG PO TABS: 5.0000 mg | ORAL_TABLET | ORAL | Status: DC | PRN

## 2013-10-05 MED ORDER — DSS 100 MG PO CAPS: 100.0000 mg | ORAL_CAPSULE | Freq: Two times a day (BID) | ORAL | Status: DC

## 2013-10-05 MED ORDER — RIVAROXABAN 10 MG PO TABS: 10.0000 mg | ORAL_TABLET | Freq: Every day | ORAL | Status: DC

## 2013-10-05 MED ORDER — TRAMADOL HCL 50 MG PO TABS: 50.0000 mg | ORAL_TABLET | Freq: Four times a day (QID) | ORAL | Status: DC | PRN

## 2013-10-05 NOTE — Progress Notes (Signed)
Placed pt on CPAP at [facility] per home settings. Pt is using his home mask and tubing. Sterile water was added for humidification. Pt looks comfortable and is tolerating CPAP well at this time. RT will continue to monitor as needed.

## 2013-10-05 NOTE — Evaluation (Signed)
Physical Therapy Evaluation Patient Details Name: Christopher Shepherd MRN: [id number] DOB: 31-Mar-1927 Today's Date: 10/05/2013   History of Present Illness  RTKA  Clinical Impression  Pt tolerated ambulating x 65'. Pt plans snf. Pt will benefit from PT to address problems listed below.    Follow Up Recommendations SNF    Equipment Recommendations  None recommended by PT    Recommendations for Other Services       Precautions / Restrictions Precautions Precautions: Knee;Fall Required Braces or Orthoses: Knee Immobilizer - Right Restrictions Weight Bearing Restrictions: No      Mobility  Bed Mobility Overal bed mobility: Needs Assistance Bed Mobility: Supine to Sit     Supine to sit: Min assist     General bed mobility comments: cues for safety, technique.  Transfers Overall transfer level: Needs assistance Equipment used: Rolling walker (2 wheeled) Transfers: Sit to/from Stand              Ambulation/Gait Ambulation/Gait assistance: Min assist Ambulation Distance (Feet): 65 Feet Assistive device: Rolling walker (2 wheeled) Gait Pattern/deviations: Step-through pattern;Decreased stance time - right;Antalgic     General Gait Details: cues for safe use of RW, position inside and sequence.  Stairs            Wheelchair Mobility    Modified Rankin (Stroke Patients Only)       Balance                                             Pertinent Vitals/Pain < 5 R knee    Home Living Family/patient expects to be discharged to:: Skilled nursing facility Living Arrangements: Spouse/significant other                    Prior Function Level of Independence: Independent               Hand Dominance        Extremity/Trunk Assessment   Upper Extremity Assessment: Overall WFL for tasks assessed           Lower Extremity Assessment: RLE deficits/detail RLE Deficits / Details: abld  to perform SLR        Communication   Communication: No difficulties  Cognition Arousal/Alertness: Awake/alert Behavior During Therapy: WFL for tasks assessed/performed Overall Cognitive Status: Within Functional Limits for tasks assessed                      General Comments      Exercises Total Joint Exercises Ankle Circles/Pumps: AROM;Right;10 reps;Supine Quad Sets: AROM;Right;10 reps;Supine Straight Leg Raises: AAROM;Right;10 reps;Supine Goniometric ROM: 10-50      Assessment/Plan    PT Assessment Patient needs continued PT services  PT Diagnosis Difficulty walking;Acute pain   PT Problem List Decreased strength;Decreased range of motion;Decreased activity tolerance;Decreased mobility;Decreased knowledge of precautions;Decreased safety awareness;Decreased knowledge of use of DME;Pain  PT Treatment Interventions DME instruction;Gait training;Functional mobility training;Therapeutic activities;Therapeutic exercise;Patient/family education   PT Goals (Current goals can be found in the Care Plan section) Acute Rehab PT Goals Patient Stated Goal: I want to get this  better. PT Goal Formulation: With patient Time For Goal Achievement: 10/12/13 Potential to Achieve Goals: Good    Frequency 7X/week   Barriers to discharge Decreased caregiver support      Co-evaluation  End of Session Equipment Utilized During Treatment: Gait belt Activity Tolerance: Patient tolerated treatment well Patient left: in chair;with call bell/phone within reach Nurse Communication: Mobility status         Time: [id number] PT Time Calculation (min): 27 min   Charges:   PT Evaluation $Initial PT Evaluation Tier I: 1 Procedure PT Treatments $Gait Training: 8-22 mins $Therapeutic Exercise: 8-22 mins   PT G Codes:          [name] 10/05/2013, 11:05 AM Tresa Endo PT [phone]

## 2013-10-05 NOTE — Progress Notes (Signed)
Clinical Social Work Department BRIEF PSYCHOSOCIAL ASSESSMENT 10/05/2013  Patient:  Christopher Shepherd, Christopher Shepherd     Account Number:  [id number]     Admit date:  10/04/2013  Clinical Social Worker:  Lacie Scotts  Date/Time:  10/05/2013 11:15 AM  Referred by:  Physician  Date Referred:  10/05/2013 Referred for  SNF Placement   Other Referral:   Interview type:   Other interview type:    PSYCHOSOCIAL DATA Living Status:  WIFE Admitted from facility:   Level of care:   Primary support name:  Christopher Shepherd Primary support relationship to patient:  SPOUSE Degree of support available:   unclear    CURRENT CONCERNS Current Concerns  Post-Acute Placement   Other Concerns:    SOCIAL WORK ASSESSMENT / PLAN Pt is an 80 yr old gentleman living at home with spouse prior to hospitalization. This is a planned surgery. CSW met with pt to assist with d/c planning. Pt has made arrangements to have [facility] Rehab at [facility] following hospital d/c. CSW has contacted SNF and d/c plan has been confirmed. CSW will follow to assist with d/c planning.   Assessment/plan status:  Psychosocial Support/Ongoing Assessment of Needs Other assessment/ plan:   Information/referral to community resources:   Insurance coverage for SNF and ambulance transport reviewed.    PATIENT'S/FAMILY'S RESPONSE TO PLAN OF CARE: Pt had a good night following surgery. His pain is being controlled. He is motivated to begin therapy and looking forward to rehab at [facility].   Christopher Lean LCSW [phone]

## 2013-10-05 NOTE — Progress Notes (Signed)
OT Cancellation Note  Patient Details Name: Casmer Lane MRN: [id number] DOB: [birth year]   Cancelled Treatment:     Pt plans snf and OT eval is not needed for admission.  Will defer OT to that venue.  Lashawndra Lampkins 10/05/2013, 8:34 AM Lesle Chris, OTR/L [phone] 10/05/2013

## 2013-10-05 NOTE — Progress Notes (Signed)
Physical Therapy Treatment Patient Details Name: Christopher Shepherd MRN: [id number] DOB: 1927/05/06 Today's Date: 10/05/2013    History of Present Illness RTKA    PT Comments    Pt ambulated  , min pain.   Follow Up Recommendations  SNF     Equipment Recommendations  None recommended by PT    Recommendations for Other Services       Precautions / Restrictions Precautions Precautions: Knee;Fall Required Braces or Orthoses: Knee Immobilizer - Right    Mobility  Bed Mobility Overal bed mobility: Needs Assistance Bed Mobility: Sit to Supine     Supine to sit: Min assist Sit to supine: Min assist   General bed mobility comments: cues for safety, technique.assist R leg onto bed.  Transfers Overall transfer level: Needs assistance Equipment used: Rolling walker (2 wheeled) Transfers: Sit to/from Stand              Ambulation/Gait Ambulation/Gait assistance: Min assist Ambulation Distance (Feet): 65 Feet Assistive device: Rolling walker (2 wheeled) Gait Pattern/deviations: Step-through pattern;Decreased stance time - right;Antalgic     General Gait Details: cues for safe use of RW, position inside and sequence.   Stairs            Wheelchair Mobility    Modified Rankin (Stroke Patients Only)       Balance                                    Cognition Arousal/Alertness: Awake/alert Behavior During Therapy: WFL for tasks assessed/performed Overall Cognitive Status: Within Functional Limits for tasks assessed                      Exercises Total Joint Exercises Ankle Circles/Pumps: AROM;Right;10 reps;Supine Quad Sets: AROM;Right;10 reps;Supine Straight Leg Raises: AAROM;Right;10 reps;Supine Goniometric ROM: 10-50    General Comments        Pertinent Vitals/Pain Reports pain 6 , RN aware    Home Living Family/patient expects to be discharged to:: Skilled nursing facility Living Arrangements: Spouse/significant  other                  Prior Function Level of Independence: Independent          PT Goals (current goals can now be found in the care plan section) Acute Rehab PT Goals Patient Stated Goal: I want to get this  better. PT Goal Formulation: With patient Time For Goal Achievement: 10/12/13 Potential to Achieve Goals: Good Progress towards PT goals: Progressing toward goals    Frequency  7X/week    PT Plan Current plan remains appropriate    Co-evaluation             End of Session Equipment Utilized During Treatment: Gait belt Activity Tolerance: Patient tolerated treatment well Patient left: in bed;with call bell/phone within reach     Time: 1216-1240 PT Time Calculation (min): 24 min  Charges:  $Gait Training: 8-22 mins $Therapeutic Exercise: 8-22 mins $Self Care/Home Management: 03/21/2023                    G Codes:      [name] 10/05/2013, 2:52 PM

## 2013-10-05 NOTE — Progress Notes (Signed)
Clinical Social Work Department CLINICAL SOCIAL WORK PLACEMENT NOTE 10/05/2013  Patient:  THEOPHILUS, WILDEBOER  Account Number:  [id number] Admit date:  10/04/2013  Clinical Social Worker:  [name], LCSW  Date/time:  10/05/2013 11:44 AM  Clinical Social Work is seeking post-discharge placement for this patient at the following level of care:   SKILLED NURSING   (*CSW will update this form in Epic as items are completed)   10/05/2013  Patient/family provided with [place] Department of Clinical Social Work's list of facilities offering this level of care within the geographic area requested by the patient (or if unable, by the patient's family).    Patient/family informed of their freedom to choose among providers that offer the needed level of care, that participate in Medicare, Medicaid or managed care program needed by the patient, have an available bed and are willing to accept the patient.    Patient/family informed of [facility]' ownership interest in [facility], as well as of the fact that they are under no obligation to receive care at this facility.  PASARR submitted to EDS on 10/04/2013 PASARR number received from EDS on 10/04/2013  FL2 transmitted to all facilities in geographic area requested by pt/family on  10/05/2013 FL2 transmitted to all facilities within larger geographic area on   Patient informed that his/her managed care company has contracts with or will negotiate with  certain facilities, including the following:     Patient/family informed of bed offers received:  10/05/2013 Patient chooses bed at [place] Physician recommends and patient chooses bed at    Patient to be transferred to  on   Patient to be transferred to facility by   The following physician request were entered in Epic:   Additional Comments:  [name] LCSW [phone]

## 2013-10-05 NOTE — Progress Notes (Signed)
   Subjective: 1 Day Post-Op Procedure(s) (LRB): RIGHT TOTAL KNEE ARTHROPLASTY (Right) Patient reports pain as mild.   We will start therapy today.  Plan is to go to [facility] after hospital stay.  Objective: Vital signs in last 24 hours: Temp:  [97 F (36.1 C)-98.7 F (37.1 C)] 97.8 F (36.6 C) (04/07 0558) Pulse Rate:  [50-82] 66 (04/07 0558) Resp:  [14-18] 16 (04/07 0558) BP: (129-180)/(54-83) 130/54 mmHg (04/07 0558) SpO2:  [96 %-100 %] 100 % (04/07 0558) FiO2 (%):  [100 %] 100 % (04/06 2246) Weight:  [211 lb (95.709 kg)] 211 lb (95.709 kg) (04/06 1524)  Intake/Output from previous day:  Intake/Output Summary (Last 24 hours) at 10/05/13 0719 Last data filed at 10/05/13 0551  Gross per 24 hour  Intake 3978.33 ml  Output   1585 ml  Net 2393.33 ml    Intake/Output this shift:    Labs:  Recent Labs  10/05/13 0413  HGB 10.4*    Recent Labs  10/05/13 0413  WBC 11.5*  RBC 3.76*  HCT 31.6*  PLT 139*    Recent Labs  10/05/13 0413  NA 140  K 4.1  CL 106  CO2 24  BUN 13  CREATININE 1.28  GLUCOSE 130*  CALCIUM 8.2*   No results found for this basename: LABPT, INR,  in the last 72 hours  EXAM General - Patient is Alert, Appropriate and Oriented Extremity - Neurologically intact Neurovascular intact No cellulitis present Compartment soft Dressing - dressing C/D/I Motor Function - intact, moving foot and toes well on exam.  Hemovac pulled without difficulty.  Past Medical History  Diagnosis Date  . Obstructive sleep apnea   . Cough   . Allergic rhinitis   . Hypertension   . Asthma 1994    with bronchitis  . Arthritis     Assessment/Plan: 1 Day Post-Op Procedure(s) (LRB): RIGHT TOTAL KNEE ARTHROPLASTY (Right) Principal Problem:   OA (osteoarthritis) of knee   Advance diet Up with therapy D/C IV fluids  DVT Prophylaxis - Xarelto Weight-Bearing as tolerated to right leg D/C O2 and Pulse OX and try on Room Air  Ebbie Cherry  V 10/05/2013, 7:19 AM

## 2013-10-05 NOTE — Progress Notes (Signed)
Placed pt on CPAP 7cmH2O per home settings. Sterile water was added for humidification. Pt looks comfortable and is tolerating CPAP well at this time. RT will continue to monitor as needed.

## 2013-10-05 NOTE — Discharge Instructions (Signed)
° °Dr. Frank Aluisio °Total Joint Specialist °[facility] °[address] °[place], [place] [address] °[phone] ° °TOTAL KNEE REPLACEMENT POSTOPERATIVE DIRECTIONS ° ° ° °Knee Rehabilitation, Guidelines Following Surgery  °Results after knee surgery are often greatly improved when you follow the exercise, range of motion and muscle strengthening exercises prescribed by your doctor. Safety measures are also important to protect the knee from further injury. Any time any of these exercises cause you to have increased pain or swelling in your knee joint, decrease the amount until you are comfortable again and slowly increase them. If you have problems or questions, call your caregiver or physical therapist for advice.  ° °HOME CARE INSTRUCTIONS  °Remove items at home which could result in a fall. This includes throw rugs or furniture in walking pathways.  °Continue medications as instructed at time of discharge. °You may have some home medications which will be placed on hold until you complete the course of blood thinner medication.  °You may start showering once you are discharged home but do not submerge the incision under water. Just pat the incision dry and apply a dry gauze dressing on daily. °Walk with walker as instructed.  °You may resume a sexual relationship in one month or when given the OK by  your doctor.  °· Use walker as long as suggested by your caregivers. °· Avoid periods of inactivity such as sitting longer than an hour when not asleep. This helps prevent blood clots.  °You may put full weight on your legs and walk as much as is comfortable.  °You may return to work once you are cleared by your doctor.  °Do not drive a car for 6 weeks or until released by you surgeon.  °· Do not drive while taking narcotics.  °Wear the elastic stockings for three weeks following surgery during the day but you may remove then at night. °Make sure you keep all of your appointments after your  operation with all of your doctors and caregivers. You should call the office at the above phone number and make an appointment for approximately two weeks after the date of your surgery. °Change the dressing daily and reapply a dry dressing each time. °Please pick up a stool softener and laxative for home use as long as you are requiring pain medications. °· Continue to use ice on the knee for pain and swelling from surgery. You may notice swelling that will progress down to the foot and ankle.  This is normal after surgery.  Elevate the leg when you are not up walking on it.   °It is important for you to complete the blood thinner medication as prescribed by your doctor. °· Continue to use the breathing machine which will help keep your temperature down.  It is common for your temperature to cycle up and down following surgery, especially at night when you are not up moving around and exerting yourself.  The breathing machine keeps your lungs expanded and your temperature down. ° °RANGE OF MOTION AND STRENGTHENING EXERCISES  °Rehabilitation of the knee is important following a knee injury or an operation. After just a few days of immobilization, the muscles of the thigh which control the knee become weakened and shrink (atrophy). Knee exercises are designed to build up the tone and strength of the thigh muscles and to improve knee motion. Often times heat used for twenty to thirty minutes before working out will loosen up your tissues and help with improving the   range of motion but do not use heat for the first two weeks following surgery. These exercises can be done on a training (exercise) mat, on the floor, on a table or on a bed. Use what ever works the best and is most comfortable for you Knee exercises include:  Leg Lifts - While your knee is still immobilized in a splint or cast, you can do straight leg raises. Lift the leg to 60 degrees, hold for 3 sec, and slowly lower the leg. Repeat 10-20 times 2-3  times daily. Perform this exercise against resistance later as your knee gets better.  Quad and Hamstring Sets - Tighten up the muscle on the front of the thigh (Quad) and hold for 5-10 sec. Repeat this 10-20 times hourly. Hamstring sets are done by pushing the foot backward against an object and holding for 5-10 sec. Repeat as with quad sets.  A rehabilitation program following serious knee injuries can speed recovery and prevent re-injury in the future due to weakened muscles. Contact your doctor or a physical therapist for more information on knee rehabilitation.   SKILLED REHAB INSTRUCTIONS: If the patient is transferred to a skilled rehab facility following release from the hospital, a list of the current medications will be sent to the facility for the patient to continue.  When discharged from the skilled rehab facility, please have the facility set up the patient's [place] prior to being released. Also, the skilled facility will be responsible for providing the patient with their medications at time of release from the facility to include their pain medication, the muscle relaxants, and their blood thinner medication. If the patient is still at the rehab facility at time of the two week follow up appointment, the skilled rehab facility will also need to assist the patient in arranging follow up appointment in our office and any transportation needs.  MAKE SURE YOU:  Understand these instructions.  Will watch your condition.  Will get help right away if you are not doing well or get worse.    Pick up stool softner and laxative for home. Do not submerge incision under water. May shower. Continue to use ice for pain and swelling from surgery.  =======================================================   Information on my medicine - XARELTO (Rivaroxaban)  This medication education was reviewed with me or my healthcare representative as part of my discharge preparation.   The pharmacist that spoke with me during my hospital stay was:  Devarius Nelles, Julieta Bellini, RPH  Why was Xarelto prescribed for you? Xarelto was prescribed for you to reduce the risk of blood clots forming after orthopedic surgery. The medical term for these abnormal blood clots is venous thromboembolism (VTE).  What do you need to know about xarelto ? Take your Xarelto ONCE DAILY at the same time every day. You may take it either with or without food.  If you have difficulty swallowing the tablet whole, you may crush it and mix in applesauce just prior to taking your dose.  Take Xarelto exactly as prescribed by your doctor and DO NOT stop taking Xarelto without talking to the doctor who prescribed the medication.  Stopping without other VTE prevention medication to take the place of Xarelto may increase your risk of developing a clot.  After discharge, you should have regular check-up appointments with your healthcare provider that is prescribing your Xarelto.    What do you do if you miss a dose? If you miss a dose, take it as soon as  you remember on the same day then continue your regularly scheduled once daily regimen the next day. Do not take two doses of Xarelto on the same day.   Important Safety Information A possible side effect of Xarelto is bleeding. You should call your healthcare provider right away if you experience any of the following:   Bleeding from an injury or your nose that does not stop.   Unusual colored urine (red or dark brown) or unusual colored stools (red or black).   Unusual bruising for unknown reasons.   A serious fall or if you hit your head (even if there is no bleeding).  Some medicines may interact with Xarelto and might increase your risk of bleeding while on Xarelto. To help avoid this, consult your healthcare provider or pharmacist prior to using any new prescription or non-prescription medications, including herbals, vitamins, non-steroidal  anti-inflammatory drugs (NSAIDs) and supplements.  This website has more information on Xarelto: [email or web address]

## 2013-10-06 DIAGNOSIS — D62 Acute posthemorrhagic anemia: Secondary | ICD-10-CM

## 2013-10-06 LAB — BASIC METABOLIC PANEL
BUN: 17 mg/dL (ref 6–23)
CHLORIDE: 102 meq/L (ref 96–112)
CO2: 29 mEq/L (ref 19–32)
CREATININE: 1.37 mg/dL — AB (ref 0.50–1.35)
Calcium: 8.6 mg/dL (ref 8.4–10.5)
GFR calc non Af Amer: 46 mL/min — ABNORMAL LOW (ref >90)
GFR, EST AFRICAN AMERICAN: 53 mL/min — AB (ref >90)
Glucose, Bld: 134 mg/dL — ABNORMAL HIGH (ref 70–99)
Potassium: 4.5 mEq/L (ref 3.7–5.3)
Sodium: 140 mEq/L (ref 137–147)

## 2013-10-06 LAB — CBC
HEMATOCRIT: 31 % — AB (ref 39.0–52.0)
Hemoglobin: 10.1 g/dL — ABNORMAL LOW (ref 13.0–17.0)
MCH: 27.5 pg (ref 26.0–34.0)
MCHC: 32.6 g/dL (ref 30.0–36.0)
MCV: 84.5 fL (ref 78.0–100.0)
Platelets: 156 10*3/uL (ref 150–400)
RBC: 3.67 MIL/uL — ABNORMAL LOW (ref 4.22–5.81)
RDW: 14.5 % (ref 11.5–15.5)
WBC: 13.7 10*3/uL — AB (ref 4.0–10.5)

## 2013-10-06 NOTE — Progress Notes (Signed)
Physical Therapy Treatment Patient Details Name: Christopher Shepherd MRN: [id number] DOB: 1926/09/23 Today's Date: 10/06/2013    History of Present Illness RTKA    PT Comments    Improving r knee strength and Rom  . Plans snf tomorrow.  Follow Up Recommendations  SNF     Equipment Recommendations  None recommended by PT    Recommendations for Other Services       Precautions / Restrictions Precautions Precautions: Knee;Fall    Mobility  Bed Mobility                  Transfers                    Ambulation/Gait                 Stairs            Wheelchair Mobility    Modified Rankin (Stroke Patients Only)       Balance                                    Cognition                            Exercises Total Joint Exercises Ankle Circles/Pumps: AROM;Right;10 reps;Supine Quad Sets: AROM;Right;Supine;15 reps Short Arc Quad: AROM;Right;15 reps;Supine Heel Slides: AROM;Right;15 reps;Supine Hip ABduction/ADduction: AROM;Right;15 reps;Supine Straight Leg Raises: AAROM;Right;10 reps;Supine Goniometric ROM: 10-60    General Comments        Pertinent Vitals/Pain No p[ain except with flexion.    Home Living                      Prior Function            PT Goals (current goals can now be found in the care plan section) Progress towards PT goals: Progressing toward goals    Frequency  7X/week    PT Plan Current plan remains appropriate    Co-evaluation             End of Session   Activity Tolerance: Patient tolerated treatment well Patient left: in bed;with call bell/phone within reach     Time: [id number] PT Time Calculation (min): 10 min  Charges:  $Therapeutic Exercise: 8-22 mins                    G Codes:      [name] 10/06/2013, 2:22 PM

## 2013-10-06 NOTE — Clinical Documentation Improvement (Signed)
10/06/13 Possible Clinical Conditions?  Expected Acute Blood Loss Anemia Acute on chronic blood loss anemia Chronic blood loss anemia Precipitous drop in Hematocrit Other Condition   Risk Factors: recent surgery, pre op anemia, EBL in OR Diagnostics: h&h prior: 06/19/2012 (12.2/37.4); 09/27/2013 (12.4/38.1) H&H:10/05/2013 (10.4/31.6) Post OP H&H: 10/06/2013 (10.1/31.0) Serial H&H monitoring Thank You, Joya Salm ,RN Clinical Documentation Specialist:  [phone] [facility]

## 2013-10-06 NOTE — Plan of Care (Signed)
Problem: Phase II Progression Outcomes Goal: Return of bowel function (flatus, BM) IF ABDOMINAL SURGERY:  Outcome: Progressing Positive for flatus

## 2013-10-06 NOTE — Progress Notes (Signed)
Rt placed pt on CPAP at his request. Pt tolerating well. No adverse reaction to therapy.Rt will follow as needed.

## 2013-10-06 NOTE — Clinical Documentation Improvement (Signed)
Possible Clinical Conditions?  CKD Stage III - GFR 30-59 Other condition  Supporting Information: Risk Factors: pmh: HTN Diagnostics: GFR: 55 06/19/2012; GFR 53-58 2015 Treatment: monitor, evaluate Thank You, Joya Salm ,RN Clinical Documentation Specialist:  [place] [facility]

## 2013-10-06 NOTE — Progress Notes (Signed)
   Subjective: 2 Days Post-Op Procedure(s) (LRB): RIGHT TOTAL KNEE ARTHROPLASTY (Right) Patient reports pain as mild.   Patient seen in rounds with Dr. Wynelle Link. Patient is well, and has had no acute complaints or problems other than constipation. Positive flatus but no BM yet. Voiding well. No issues overnight. No SOB or chest pain.  Plan is to go Skilled nursing facility after hospital stay.  Objective: Vital signs in last 24 hours: Temp:  [97.9 F (36.6 C)-99 F (37.2 C)] 99 F (37.2 C) (04/08 0515) Pulse Rate:  [64-74] 73 (04/08 0515) Resp:  [14-17] 16 (04/08 0739) BP: (135-164)/(54-70) 150/54 mmHg (04/08 0515) SpO2:  [92 %-100 %] 97 % (04/08 0515)  Intake/Output from previous day:  Intake/Output Summary (Last 24 hours) at 10/06/13 0801 Last data filed at 10/06/13 0738  Gross per 24 hour  Intake 1397.93 ml  Output   1175 ml  Net 222.93 ml    Intake/Output this shift: Total I/O In: -  Out: 300 [Urine:300]  Labs:  Recent Labs  10/05/13 0413 10/06/13 0418  HGB 10.4* 10.1*    Recent Labs  10/05/13 0413 10/06/13 0418  WBC 11.5* 13.7*  RBC 3.76* 3.67*  HCT 31.6* 31.0*  PLT 139* 156    Recent Labs  10/05/13 0413 10/06/13 0418  NA 140 140  K 4.1 4.5  CL 106 102  CO2 24 29  BUN 13 17  CREATININE 1.28 1.37*  GLUCOSE 130* 134*  CALCIUM 8.2* 8.6    EXAM General - Patient is Alert and Oriented Extremity - Neurologically intact Intact pulses distally Dorsiflexion/Plantar flexion intact No cellulitis present Compartment soft Dressing/Incision - clean, dry, no drainage Motor Function - intact, moving foot and toes well on exam.   Past Medical History  Diagnosis Date  . Obstructive sleep apnea   . Cough   . Allergic rhinitis   . Hypertension   . Asthma 1994    with bronchitis  . Arthritis     Assessment/Plan: 2 Days Post-Op Procedure(s) (LRB): RIGHT TOTAL KNEE ARTHROPLASTY (Right) Principal Problem:   OA (osteoarthritis) of  knee  Estimated body mass index is 34.07 kg/(m^2) as calculated from the following:   Height as of this encounter: 5\' 6"  (1.676 m).   Weight as of this encounter: 95.709 kg (211 lb). Advance diet Up with therapy D/C IV fluids Plan for discharge tomorrow to SNF  DVT Prophylaxis - Xarelto Weight-Bearing as tolerated   Doing great. Will add some miralax for constipation in addition to colace. Will continue therapy today. DC to SNF tomorrow likely.   Vonita Calloway Renelda Loma 10/06/2013, 8:01 AM

## 2013-10-06 NOTE — Discharge Summary (Signed)
Physician Discharge Summary   Patient ID: Christopher Shepherd MRN: [id number] DOB/AGE: 07-Mar-1927 80 y.o.  Admit date: 10/04/2013 Discharge date: 10/07/2013  Primary Diagnosis: Osteoarthritis, right knee   S/P right total knee arthroplasty   Admission Diagnoses:  Past Medical History  Diagnosis Date  . Obstructive sleep apnea   . Cough   . Allergic rhinitis   . Hypertension   . Asthma 1994    with bronchitis  . Arthritis    Discharge Diagnoses:   Principal Problem:   OA (osteoarthritis) of knee Active Problems:   Acute blood loss anemia  Estimated body mass index is 34.07 kg/(m^2) as calculated from the following:   Height as of this encounter: '5\' 6"'  (1.676 m).   Weight as of this encounter: 95.709 kg (211 lb).  Procedure:  Procedure(s) (LRB): RIGHT TOTAL KNEE ARTHROPLASTY (Right)   Consults: None  HPI: The patient is a 80 year old male who presents today for follow up of their knee. The patient is being followed for their right knee pain and osteoarthritis. They are now several months out from Synvisc series. Symptoms reported today include: pain, aching and difficulty ambulating. The patient feels that they are doing poorly and report their pain level to be moderate to severe. Current treatment includes: NSAIDs (ibuprofen) and icing. The patient has not gotten any relief of their symptoms with viscosupplementation. The patient indicates that they have questions or concerns today regarding surgery.  Mr. Skog is at a stage now where the injections do not help anymore. The knee is hurting at all times. It is especially bad with activity. Generally he can still sleep well, but he is having more difficulties with functional activities. He would like to be more active, but the knee is preventing him from doing so. He would like to proceed with surgery for the right knee at this time.  They have been treated conservatively in the past for the above stated problem and despite  conservative measures, they continue to have progressive pain and severe functional limitations and dysfunction. They have failed non-operative management including home exercise, medications, and injections. It is felt that they would benefit from undergoing total joint replacement. Risks and benefits of the procedure have been discussed with the patient and they elect to proceed with surgery. There are no active contraindications to surgery such as ongoing infection or rapidly progressive neurological disease.     Laboratory Data: Admission on 10/04/2013  Component Date Value Ref Range Status  . ABO/RH(D) 09/27/2013 O POS   Final  . WBC 10/05/2013 11.5* 4.0 - 10.5 K/uL Final  . RBC 10/05/2013 3.76* 4.22 - 5.81 MIL/uL Final  . Hemoglobin 10/05/2013 10.4* 13.0 - 17.0 g/dL Final  . HCT 10/05/2013 31.6* 39.0 - 52.0 % Final  . MCV 10/05/2013 84.0  78.0 - 100.0 fL Final  . MCH 10/05/2013 27.7  26.0 - 34.0 pg Final  . MCHC 10/05/2013 32.9  30.0 - 36.0 g/dL Final  . RDW 10/05/2013 14.3  11.5 - 15.5 % Final  . Platelets 10/05/2013 139* 150 - 400 K/uL Final  . Sodium 10/05/2013 140  137 - 147 mEq/L Final  . Potassium 10/05/2013 4.1  3.7 - 5.3 mEq/L Final  . Chloride 10/05/2013 106  96 - 112 mEq/L Final  . CO2 10/05/2013 24  19 - 32 mEq/L Final  . Glucose, Bld 10/05/2013 130* 70 - 99 mg/dL Final  . BUN 10/05/2013 13  6 - 23 mg/dL Final  . Creatinine, Ser 10/05/2013 1.28  0.50 - 1.35 mg/dL Final  . Calcium 10/05/2013 8.2* 8.4 - 10.5 mg/dL Final  . GFR calc non Af Amer 10/05/2013 50* >90 mL/min Final  . GFR calc Af Amer 10/05/2013 58* >90 mL/min Final   Comment: (NOTE)                          The eGFR has been calculated using the CKD EPI equation.                          This calculation has not been validated in all clinical situations.                          eGFR's persistently <90 mL/min signify possible Chronic Kidney                          Disease.  . WBC 10/06/2013 13.7* 4.0 - 10.5  K/uL Final  . RBC 10/06/2013 3.67* 4.22 - 5.81 MIL/uL Final  . Hemoglobin 10/06/2013 10.1* 13.0 - 17.0 g/dL Final  . HCT 10/06/2013 31.0* 39.0 - 52.0 % Final  . MCV 10/06/2013 84.5  78.0 - 100.0 fL Final  . MCH 10/06/2013 27.5  26.0 - 34.0 pg Final  . MCHC 10/06/2013 32.6  30.0 - 36.0 g/dL Final  . RDW 10/06/2013 14.5  11.5 - 15.5 % Final  . Platelets 10/06/2013 156  150 - 400 K/uL Final  . Sodium 10/06/2013 140  137 - 147 mEq/L Final  . Potassium 10/06/2013 4.5  3.7 - 5.3 mEq/L Final  . Chloride 10/06/2013 102  96 - 112 mEq/L Final  . CO2 10/06/2013 29  19 - 32 mEq/L Final  . Glucose, Bld 10/06/2013 134* 70 - 99 mg/dL Final  . BUN 10/06/2013 17  6 - 23 mg/dL Final  . Creatinine, Ser 10/06/2013 1.37* 0.50 - 1.35 mg/dL Final  . Calcium 10/06/2013 8.6  8.4 - 10.5 mg/dL Final  . GFR calc non Af Amer 10/06/2013 46* >90 mL/min Final  . GFR calc Af Amer 10/06/2013 53* >90 mL/min Final   Comment: (NOTE)                          The eGFR has been calculated using the CKD EPI equation.                          This calculation has not been validated in all clinical situations.                          eGFR's persistently <90 mL/min signify possible Chronic Kidney                          Disease.  Hospital Outpatient Visit on 09/27/2013  Component Date Value Ref Range Status  . aPTT 09/27/2013 29  24 - 37 seconds Final  . WBC 09/27/2013 4.7  4.0 - 10.5 K/uL Final  . RBC 09/27/2013 4.51  4.22 - 5.81 MIL/uL Final  . Hemoglobin 09/27/2013 12.4* 13.0 - 17.0 g/dL Final  . HCT 09/27/2013 38.1* 39.0 - 52.0 % Final  . MCV 09/27/2013 84.5  78.0 - 100.0 fL Final  . MCH 09/27/2013 27.5  26.0 - 34.0 pg Final  .  MCHC 09/27/2013 32.5  30.0 - 36.0 g/dL Final  . RDW 09/27/2013 14.0  11.5 - 15.5 % Final  . Platelets 09/27/2013 151  150 - 400 K/uL Final  . Sodium 09/27/2013 141  137 - 147 mEq/L Final  . Potassium 09/27/2013 3.7  3.7 - 5.3 mEq/L Final  . Chloride 09/27/2013 103  96 - 112 mEq/L Final  .  CO2 09/27/2013 27  19 - 32 mEq/L Final  . Glucose, Bld 09/27/2013 88  70 - 99 mg/dL Final  . BUN 09/27/2013 14  6 - 23 mg/dL Final  . Creatinine, Ser 09/27/2013 1.38* 0.50 - 1.35 mg/dL Final  . Calcium 09/27/2013 8.7  8.4 - 10.5 mg/dL Final  . Total Protein 09/27/2013 6.6  6.0 - 8.3 g/dL Final  . Albumin 09/27/2013 3.2* 3.5 - 5.2 g/dL Final  . AST 09/27/2013 16  0 - 37 U/L Final  . ALT 09/27/2013 17  0 - 53 U/L Final  . Alkaline Phosphatase 09/27/2013 81  39 - 117 U/L Final  . Total Bilirubin 09/27/2013 0.4  0.3 - 1.2 mg/dL Final  . GFR calc non Af Amer 09/27/2013 45* >90 mL/min Final  . GFR calc Af Amer 09/27/2013 53* >90 mL/min Final   Comment: (NOTE)                          The eGFR has been calculated using the CKD EPI equation.                          This calculation has not been validated in all clinical situations.                          eGFR's persistently <90 mL/min signify possible Chronic Kidney                          Disease.  Marland Kitchen Prothrombin Time 09/27/2013 13.0  11.6 - 15.2 seconds Final  . INR 09/27/2013 1.00  0.00 - 1.49 Final  . ABO/RH(D) 09/27/2013 O POS   Final  . Antibody Screen 09/27/2013 NEG   Final  . Sample Expiration 09/27/2013 10/07/2013   Final  . Color, Urine 09/27/2013 YELLOW  YELLOW Final  . APPearance 09/27/2013 CLEAR  CLEAR Final  . Specific Gravity, Urine 09/27/2013 1.017  1.005 - 1.030 Final  . pH 09/27/2013 5.5  5.0 - 8.0 Final  . Glucose, UA 09/27/2013 NEGATIVE  NEGATIVE mg/dL Final  . Hgb urine dipstick 09/27/2013 NEGATIVE  NEGATIVE Final  . Bilirubin Urine 09/27/2013 NEGATIVE  NEGATIVE Final  . Ketones, ur 09/27/2013 NEGATIVE  NEGATIVE mg/dL Final  . Protein, ur 09/27/2013 NEGATIVE  NEGATIVE mg/dL Final  . Urobilinogen, UA 09/27/2013 0.2  0.0 - 1.0 mg/dL Final  . Nitrite 09/27/2013 NEGATIVE  NEGATIVE Final  . Leukocytes, UA 09/27/2013 NEGATIVE  NEGATIVE Final   MICROSCOPIC NOT DONE ON URINES WITH NEGATIVE PROTEIN, BLOOD, LEUKOCYTES,  NITRITE, OR GLUCOSE <1000 mg/dL.  Marland Kitchen MRSA, PCR 09/27/2013 INVALID RESULTS, SPECIMEN SENT FOR CULTURE* NEGATIVE Final   INFORMED CAMPBELL,P. RN '@1319'  ON 09/27/13 BY MCCOY,N.  . Staphylococcus aureus 09/27/2013 INVALID RESULTS, SPECIMEN SENT FOR CULTURE* NEGATIVE Final   Comment: INFORMED CAMPBELL,P. RN '@1319'  ON 09/27/13 BY MCCOY,N.  The Xpert SA Assay (FDA                          approved for NASAL specimens                          in patients over 52 years of age),                          is one component of                          a comprehensive surveillance                          program.  Test performance has                          been validated by [employer] for patients greater                          than or equal to 72 year old.                          It is not intended                          to diagnose infection nor to                          guide or monitor treatment.  Marland Kitchen Specimen Description 09/27/2013 NOSE   Final  . Special Requests 09/27/2013 NONE   Final  . Culture 09/27/2013    Final                   Value:NO STAPHYLOCOCCUS AUREUS ISOLATED                         Note: NOMRSA                         Performed at [facility]  . Report Status 09/27/2013 09/29/2013 FINAL   Final     X-Rays:Dg Chest 2 View  09/27/2013   CLINICAL DATA:  Preoperative film for knee surgery, hypertension  EXAM: CHEST  2 VIEW  COMPARISON:  None.  FINDINGS: Mild left-sided cardiac enlargement. Mild uncoiling of the aorta. Vascular pattern otherwise normal. Lungs clear.  IMPRESSION: No acute findings.  Mild cardiac enlargement.   Electronically Signed   By: Skipper Cliche M.D.   On: 09/27/2013 13:44    EKG: Orders placed during the hospital encounter of 09/27/13  . EKG 12-LEAD  . EKG 12-LEAD     Hospital Course: Christopher Shepherd is a 80 y.o. who was admitted to [facility]. They  were brought to the operating room on 10/04/2013 and underwent Procedure(s): RIGHT TOTAL KNEE ARTHROPLASTY.  Patient tolerated the procedure well and was later transferred to the recovery room and then to the orthopaedic floor for postoperative care.  They were given  PO and IV analgesics for pain control following their surgery.  They were given 24 hours of postoperative antibiotics of  Anti-infectives   Start     Dose/Rate Route Frequency Ordered Stop   10/04/13 1800  ceFAZolin (ANCEF) IVPB 2 g/50 mL premix     2 g 100 mL/hr over 30 Minutes Intravenous Every 6 hours 10/04/13 1503 10/05/13 0058   10/04/13 0828  ceFAZolin (ANCEF) IVPB 2 g/50 mL premix     2 g 100 mL/hr over 30 Minutes Intravenous On call to O.R. 10/04/13 3329 10/04/13 1208     and started on DVT prophylaxis in the form of Xarelto.   PT and OT were ordered for total joint protocol.  Discharge planning consulted to help with postop disposition and equipment needs.  Patient had a decent night on the evening of surgery.  They started to get up OOB with therapy on day one. Hemovac drain was pulled without difficulty.  Continued to work with therapy into day two.  Dressing was changed on day two and the incision was clean and dry. Patient developed ABLA but remained asymptomatic. By day three, the patient had progressed with therapy and meeting their goals.  Incision was healing well.  Patient was seen in rounds and was ready to go to SNF.   Discharge Medications: Prior to Admission medications   Medication Sig Start Date End Date Taking? Authorizing Provider  losartan (COZAAR) 100 MG tablet Take 100 mg by mouth every morning.   Yes Historical Provider, MD  mometasone (NASONEX) 50 MCG/ACT nasal spray Place 2 sprays into the nose as needed.    Yes Historical Provider, MD  atorvastatin (LIPITOR) 80 MG tablet Take 80 mg by mouth daily at 6 PM.     Historical Provider, MD  docusate sodium 100 MG CAPS Take 100 mg by mouth 2 (two) times daily.  10/05/13   Magdalina Whitehead Renelda Loma, PA-C  hydrochlorothiazide (HYDRODIURIL) 25 MG tablet Take 25 mg by mouth daily.     Historical Provider, MD  methocarbamol (ROBAXIN) 500 MG tablet Take 1 tablet (500 mg total) by mouth every 6 (six) hours as needed for muscle spasms. 10/05/13   Nellie Chevalier Renelda Loma, PA-C  oxyCODONE (OXY IR/ROXICODONE) 5 MG immediate release tablet Take 1-2 tablets (5-10 mg total) by mouth every 3 (three) hours as needed for breakthrough pain. 10/05/13   Berneice Zettlemoyer Renelda Loma, PA-C  rivaroxaban (XARELTO) 10 MG TABS tablet Take 1 tablet (10 mg total) by mouth daily with breakfast. 10/05/13   Kirat Mezquita Renelda Loma, PA-C  traMADol (ULTRAM) 50 MG tablet Take 1-2 tablets (50-100 mg total) by mouth every 6 (six) hours as needed for moderate pain. 10/05/13   Jahshua Bonito Renelda Loma, PA-C    Diet: Cardiac diet Activity:WBAT Follow-up:in 2 weeks Disposition - Skilled nursing facility Discharged Condition: stable   Discharge Orders   Future Appointments Provider Department Dept Phone   01/25/2014 2:00 PM Deneise Lever, MD [facility] [phone]   Future Orders Complete By Expires   Call MD / Call 911  As directed    Change dressing  As directed    Constipation Prevention  As directed    Diet - low sodium heart healthy  As directed    Discharge instructions  As directed    Do not put a pillow under the knee. Place it under the heel.  As directed    Driving restrictions  As directed    Increase activity slowly as tolerated  As directed  Medication List    STOP taking these medications       aspirin EC 81 MG tablet     cholecalciferol 1000 UNITS tablet  Commonly known as:  VITAMIN D     ibuprofen 800 MG tablet  Commonly known as:  ADVIL,MOTRIN      TAKE these medications       atorvastatin 80 MG tablet  Commonly known as:  LIPITOR  Take 80 mg by mouth daily at 6 PM.     DSS 100 MG Caps  Take 100 mg by mouth 2 (two) times daily.      hydrochlorothiazide 25 MG tablet  Commonly known as:  HYDRODIURIL  Take 25 mg by mouth daily.     losartan 100 MG tablet  Commonly known as:  COZAAR  Take 100 mg by mouth every morning.     methocarbamol 500 MG tablet  Commonly known as:  ROBAXIN  Take 1 tablet (500 mg total) by mouth every 6 (six) hours as needed for muscle spasms.     NASONEX 50 MCG/ACT nasal spray  Generic drug:  mometasone  Place 2 sprays into the nose as needed.     oxyCODONE 5 MG immediate release tablet  Commonly known as:  Oxy IR/ROXICODONE  Take 1-2 tablets (5-10 mg total) by mouth every 3 (three) hours as needed for breakthrough pain.     rivaroxaban 10 MG Tabs tablet  Commonly known as:  XARELTO  Take 1 tablet (10 mg total) by mouth daily with breakfast.     traMADol 50 MG tablet  Commonly known as:  ULTRAM  Take 1-2 tablets (50-100 mg total) by mouth every 6 (six) hours as needed for moderate pain.           Follow-up Information   Follow up with Gearlean Alf, MD. Schedule an appointment as soon as possible for a visit on 10/19/2013. (Call [phone] tomorrow to make the appointment)    Specialty:  Orthopedic Surgery   Contact information:   [address] [place] [address] [phone]       Signed: Malachy Chamber 10/06/2013, 10:28 PM

## 2013-10-06 NOTE — Care Management Note (Signed)
    Page 1 of 1   10/06/2013     10:52:24 AM   CARE MANAGEMENT NOTE 10/06/2013  Patient:  Christopher Shepherd, Christopher Shepherd   Account Number:  [id number]  Date Initiated:  10/06/2013  Documentation initiated by:  [facility]  Subjective/Objective Assessment:   80 year old male admitted s/p Right Total Knee Arthroplasty.     Action/Plan:   SNF at d/c.   Anticipated DC Date:  10/07/2013   Anticipated DC Plan:  SKILLED NURSING FACILITY  In-house referral  Clinical Social Worker      DC Planning Services  CM consult      Choice offered to / List presented to:             Status of service:  Completed, signed off Medicare Important Message given?  NA - LOS <3 / Initial given by admissions (If response is "NO", the following Medicare IM given date fields will be blank) Date Medicare IM given:   Date Additional Medicare IM given:    Discharge Disposition:  [place]  Per UR Regulation:  Reviewed for med. necessity/level of care/duration of stay  If discussed at [place] of Stay Meetings, dates discussed:    Comments:

## 2013-10-07 ENCOUNTER — Encounter: Payer: Self-pay | Admitting: *Deleted

## 2013-10-07 LAB — CBC
HCT: 27.2 % — ABNORMAL LOW (ref 39.0–52.0)
Hemoglobin: 8.9 g/dL — ABNORMAL LOW (ref 13.0–17.0)
MCH: 28.1 pg (ref 26.0–34.0)
MCHC: 32.7 g/dL (ref 30.0–36.0)
MCV: 85.8 fL (ref 78.0–100.0)
PLATELETS: 137 10*3/uL — AB (ref 150–400)
RBC: 3.17 MIL/uL — AB (ref 4.22–5.81)
RDW: 14.6 % (ref 11.5–15.5)
WBC: 11.3 10*3/uL — ABNORMAL HIGH (ref 4.0–10.5)

## 2013-10-07 MED ORDER — BISACODYL 10 MG RE SUPP
10.0000 mg | Freq: Once | RECTAL | Status: AC
  Administered 2013-10-07: 10 mg via RECTAL
  Filled 2013-10-07: qty 1

## 2013-10-07 NOTE — Progress Notes (Addendum)
Physical Therapy Treatment Patient Details Name: Andriy Purser MRN: [id number] DOB: 1926-07-10 Today's Date: 10/07/2013    History of Present Illness RTKA    PT Comments    Pt tolerated AMBULATION WELL. No  Need for KI, able to perform SLR.  Follow Up Recommendations  SNF     Equipment Recommendations  None recommended by PT    Recommendations for Other Services       Precautions / Restrictions Precautions Precautions: Knee;Fall Restrictions Weight Bearing Restrictions: No    Mobility  Bed Mobility                Transfers   Equipment used: Rolling walker (2 wheeled) Transfers: Sit to/from Stand Sit to Stand: Min guard         General transfer comment: cues for safe sue  of RW.  Ambulation/Gait Ambulation/Gait assistance: Min guard Ambulation Distance (Feet): 120 Feet Assistive device: Rolling walker (2 wheeled)           Stairs            Wheelchair Mobility    Modified Rankin (Stroke Patients Only)       Balance                                    Cognition Arousal/Alertness: Awake/alert                          Exercises Total Joint Exercises Ankle Circles/Pumps: AROM;Right;10 reps;Supine Quad Sets: AROM;Right;Supine;15 reps Short Arc Quad: AROM;Right;15 reps;Supine Heel Slides: AROM;Supine;10 reps Straight Leg Raises: AROM;Right;10 reps;Supine Seated knee flexion and extension x 20 Goniometric ROM: 10-70    General Comments        Pertinent Vitals/Pain No pain    Home Living                      Prior Function            PT Goals (current goals can now be found in the care plan section) Progress towards PT goals: Progressing toward goals    Frequency  7X/week    PT Plan Current plan remains appropriate    Co-evaluation             End of Session   Activity Tolerance: Patient tolerated treatment well Patient left: in chair;with call bell/phone within  reach;with family/visitor present     Time: [id number] PT Time Calculation (min): 31 min  Charges:  $Gait Training: 8-22 mins exercise  8-22                  G Codes:      [name] 10/07/2013, 9:23 AM

## 2013-10-07 NOTE — Progress Notes (Signed)
Clinical Social Work Department CLINICAL SOCIAL WORK PLACEMENT NOTE 10/07/2013  Patient:  Christopher Shepherd, Christopher Shepherd  Account Number:  [id number] Admit date:  10/04/2013  Clinical Social Worker:  [name], LCSW  Date/time:  10/05/2013 11:44 AM  Clinical Social Work is seeking post-discharge placement for this patient at the following level of care:   SKILLED NURSING   (*CSW will update this form in Epic as items are completed)   10/05/2013  Patient/family provided with [place] Department of Clinical Social Work's list of facilities offering this level of care within the geographic area requested by the patient (or if unable, by the patient's family).    Patient/family informed of their freedom to choose among providers that offer the needed level of care, that participate in Medicare, Medicaid or managed care program needed by the patient, have an available bed and are willing to accept the patient.    Patient/family informed of [facility]' ownership interest in [facility], as well as of the fact that they are under no obligation to receive care at this facility.  PASARR submitted to EDS on 10/04/2013 PASARR number received from EDS on 10/04/2013  FL2 transmitted to all facilities in geographic area requested by pt/family on  10/05/2013 FL2 transmitted to all facilities within larger geographic area on   Patient informed that his/her managed care company has contracts with or will negotiate with  certain facilities, including the following:     Patient/family informed of bed offers received:  10/05/2013 Patient chooses bed at [place] Physician recommends and patient chooses bed at    Patient to be transferred to [facility] on  10/07/2013 Patient to be transferred to facility by [facility]  The following physician request were entered in Epic:   Additional Comments:  [name] LCSW [phone]

## 2013-10-07 NOTE — Progress Notes (Signed)
Patient and family given discharge instructions. Copy given to patient. Patient and family verbalized understanding with teach back.  EMS to pick patient up and transport to [facility].  Report given to transport.  [place] to give report. Report given to [place] Dec at [facility].

## 2013-10-08 ENCOUNTER — Other Ambulatory Visit: Payer: Self-pay | Admitting: *Deleted

## 2013-10-08 ENCOUNTER — Encounter: Payer: Self-pay | Admitting: *Deleted

## 2013-10-08 ENCOUNTER — Encounter: Payer: Self-pay | Admitting: Adult Health

## 2013-10-08 ENCOUNTER — Non-Acute Institutional Stay (SKILLED_NURSING_FACILITY): Payer: Medicare Other | Admitting: Adult Health

## 2013-10-08 DIAGNOSIS — M1710 Unilateral primary osteoarthritis, unspecified knee: Secondary | ICD-10-CM

## 2013-10-08 DIAGNOSIS — D62 Acute posthemorrhagic anemia: Secondary | ICD-10-CM

## 2013-10-08 DIAGNOSIS — IMO0002 Reserved for concepts with insufficient information to code with codable children: Secondary | ICD-10-CM

## 2013-10-08 DIAGNOSIS — R066 Hiccough: Secondary | ICD-10-CM | POA: Insufficient documentation

## 2013-10-08 DIAGNOSIS — K5900 Constipation, unspecified: Secondary | ICD-10-CM | POA: Insufficient documentation

## 2013-10-08 DIAGNOSIS — M179 Osteoarthritis of knee, unspecified: Secondary | ICD-10-CM

## 2013-10-08 DIAGNOSIS — E785 Hyperlipidemia, unspecified: Secondary | ICD-10-CM | POA: Insufficient documentation

## 2013-10-08 DIAGNOSIS — I10 Essential (primary) hypertension: Secondary | ICD-10-CM | POA: Insufficient documentation

## 2013-10-08 MED ORDER — TRAMADOL HCL 50 MG PO TABS: 50.0000 mg | ORAL_TABLET | Freq: Four times a day (QID) | ORAL | Status: DC | PRN

## 2013-10-08 MED ORDER — OXYCODONE HCL 5 MG PO TABS: 5.0000 mg | ORAL_TABLET | ORAL | Status: DC | PRN

## 2013-10-08 NOTE — Telephone Encounter (Signed)
rx printed and faxed to [facility] @ [phone].

## 2013-10-08 NOTE — Progress Notes (Signed)
Patient ID: Christopher Shepherd, male   DOB: [birth year], 80 y.o.   MRN: [id number]               PROGRESS NOTE  DATE: 10/08/2013  FACILITY: Nursing Home Location: [facility]  LEVEL OF CARE: SNF (31)  Acute Visit  CHIEF COMPLAINT:  Follow-up hospitalization  HISTORY OF PRESENT ILLNESS: This is an 80 year old male who has been admitted to [facility] on 10/07/13 from [facility] with Osteoarthritis S/P Right Total Knee Arthroplasty. He has been admitted for a short-term rehabilitation.  REASSESSMENT OF ONGOING PROBLEM(S):  HTN: Pt 's HTN remains stable.  Denies CP, sob, DOE, pedal edema, headaches, dizziness or visual disturbances.  No complications from the medications currently being used.  Last BP : 131/63  ANEMIA: The anemia has been stable. The patient denies fatigue, melena or hematochezia. No complications from the medications currently being used.  4/15 hgb 8.9  HYPERLIPIDEMIA: No complications from the medications presently being used.    PAST MEDICAL HISTORY : Reviewed.  No changes.  CURRENT MEDICATIONS: Reviewed per [facility]  REVIEW OF SYSTEMS:  GENERAL: no change in appetite, no fatigue, no weight changes, no fever, chills or weakness RESPIRATORY: no cough, SOB, DOE, wheezing, hemoptysis, + hiccup CARDIAC: no chest pain, edema or palpitations GI: no abdominal pain, diarrhea, heart burn, nausea or vomiting, +constipation  PHYSICAL EXAMINATION  GENERAL: no acute distress, normal body habitus EYES: conjunctivae normal, sclerae normal, normal eye lids NECK: supple, trachea midline, no neck masses, no thyroid tenderness, no thyromegaly LYMPHATICS: no LAN in the neck, no supraclavicular LAN RESPIRATORY: breathing is even & unlabored, BS CTAB CARDIAC: RRR, no murmur,no extra heart sounds, no edema GI: abdomen soft, normal BS, no masses, no tenderness, no hepatomegaly, no splenomegaly EXTREMITIES: able to move all 4 extremities PSYCHIATRIC: the  patient is alert & oriented to person, affect & behavior appropriate  LABS/RADIOLOGY: Labs reviewed: Basic Metabolic Panel:  Recent Labs  09/27/13 1100 10/05/13 0413 10/06/13 0418  NA 141 140 140  K 3.7 4.1 4.5  CL 103 106 102  CO2 27 24 29   GLUCOSE 88 130* 134*  BUN 14 13 17   CREATININE 1.38* 1.28 1.37*  CALCIUM 8.7 8.2* 8.6   Liver Function Tests:  Recent Labs  09/27/13 1100  AST 16  ALT 17  ALKPHOS 81  BILITOT 0.4  PROT 6.6  ALBUMIN 3.2*   CBC:  Recent Labs  10/05/13 0413 10/06/13 0418 10/07/13 0415  WBC 11.5* 13.7* 11.3*  HGB 10.4* 10.1* 8.9*  HCT 31.6* 31.0* 27.2*  MCV 84.0 84.5 85.8  PLT 139* 156 137*    ASSESSMENT/PLAN:  Osteoarthritis she is status post right total knee arthroplasty - for rehabilitation; continue oxycodone and Ultram when necessary for pain and Xarelto for DVT prophylaxis Hyperlipidemia - continue Lipitor Hypertension - well controlled; continue HCTZ and losartan Anemia - check CBC Constipation - add MiraLax daily Hiccups - start baclofen 10 mg 1 tab by mouth twice a day x3 days then when necessary   CPT CODE: 32440    Monina Vargas - NP [facility] [phone]

## 2013-10-11 ENCOUNTER — Emergency Department (HOSPITAL_COMMUNITY): Payer: Medicare Other

## 2013-10-11 ENCOUNTER — Emergency Department (HOSPITAL_COMMUNITY)
Admission: EM | Admit: 2013-10-11 | Discharge: 2013-10-12 | Disposition: A | Discharge status: Home / Self Care | Payer: Medicare Other | Attending: Emergency Medicine | Admitting: Emergency Medicine

## 2013-10-11 DIAGNOSIS — I10 Essential (primary) hypertension: Secondary | ICD-10-CM | POA: Insufficient documentation

## 2013-10-11 DIAGNOSIS — Z862 Personal history of diseases of the blood and blood-forming organs and certain disorders involving the immune mechanism: Secondary | ICD-10-CM | POA: Insufficient documentation

## 2013-10-11 DIAGNOSIS — Z7901 Long term (current) use of anticoagulants: Secondary | ICD-10-CM | POA: Insufficient documentation

## 2013-10-11 DIAGNOSIS — Z79899 Other long term (current) drug therapy: Secondary | ICD-10-CM | POA: Insufficient documentation

## 2013-10-11 DIAGNOSIS — G4733 Obstructive sleep apnea (adult) (pediatric): Secondary | ICD-10-CM | POA: Insufficient documentation

## 2013-10-11 DIAGNOSIS — R066 Hiccough: Secondary | ICD-10-CM | POA: Insufficient documentation

## 2013-10-11 DIAGNOSIS — Z87891 Personal history of nicotine dependence: Secondary | ICD-10-CM | POA: Insufficient documentation

## 2013-10-11 DIAGNOSIS — K5900 Constipation, unspecified: Secondary | ICD-10-CM

## 2013-10-11 DIAGNOSIS — Z8739 Personal history of other diseases of the musculoskeletal system and connective tissue: Secondary | ICD-10-CM | POA: Insufficient documentation

## 2013-10-11 DIAGNOSIS — J45909 Unspecified asthma, uncomplicated: Secondary | ICD-10-CM | POA: Insufficient documentation

## 2013-10-11 MED ORDER — CHLORPROMAZINE HCL 25 MG/ML IJ SOLN
25.0000 mg | Freq: Once | INTRAMUSCULAR | Status: AC
  Administered 2013-10-11: 25 mg via INTRAMUSCULAR
  Filled 2013-10-11: qty 1

## 2013-10-11 MED ORDER — FAMOTIDINE 20 MG PO TABS: 20.0000 mg | ORAL_TABLET | Freq: Two times a day (BID) | ORAL | Status: DC

## 2013-10-11 MED ORDER — POLYETHYLENE GLYCOL 3350 17 G PO PACK: 17.0000 g | PACK | Freq: Two times a day (BID) | ORAL | Status: DC

## 2013-10-11 NOTE — Progress Notes (Signed)
CSW met with patient and family at bedside.  Patient and wife reports his intent is return to [facility] once he is medically cleared.  Patient reports other than the episode of hipcups his rehab is going well.  No further follow is needed by CSW at this time.     Chesley Noon, MSW, [place], 10/11/2013 Evening Clinical Social Worker [phone]

## 2013-10-11 NOTE — ED Notes (Signed)
Bed: WA20 Expected date:  Expected time:  Means of arrival:  Comments: hiccups

## 2013-10-11 NOTE — ED Notes (Signed)
Per ems pt was seen in ED last week for hiccups. During route ems did not note any hiccups. Pt reports he does not have a hx of hiccups. Hiccups started after knee surgery. No complications with surgery or rehab. Reports when hiccups occur give him a slight choking feeling. Hiccups occur when pt tries to drink fluid or attempt to eat. Denies pain

## 2013-10-11 NOTE — ED Provider Notes (Signed)
CSN: [id number]     Arrival date & time 10/11/13  1848 History   First MD Initiated Contact with Patient 10/11/13 2123     Chief Complaint  Patient presents with  . hiccups      (Consider location/radiation/quality/duration/timing/severity/associated sxs/prior Treatment) HPI Pt is an 80yo male brought to ED via EMS from nursing home c/o hiccups that have been intermittent since knee surgery on 10/04/13.  Pt states the hiccups prevent him from sleeping.  Hiccups also give him a slight "choking feeling," worsened with drinking fluids or attempting to eat. Denies difficulty breathing or chest pain. Denies abdominal pain or distention. Denies fever, n/v/d.    Past Medical History  Diagnosis Date  . Obstructive sleep apnea     uses a C-Pap  . Cough   . Allergic rhinitis   . Hypertension   . Arthritis     osteoarthritis  . Asthma 1994    with bronchitis  . Anemia    Past Surgical History  Procedure Laterality Date  . None    . Colonoscopy w/ polypectomy    . Total knee arthroplasty Right 10/04/2013    Procedure: RIGHT TOTAL KNEE ARTHROPLASTY;  Surgeon: Gearlean Alf, MD;  Location: [facility];  Service: Orthopedics;  Laterality: Right;   Family History  Problem Relation Age of Onset  . Cancer Father     cause of death  . Colon cancer Mother     cause of death   History  Substance Use Topics  . Smoking status: Former Smoker -- 0.20 packs/day for 15 years    Types: Cigarettes    Quit date: 07/01/1968  . Smokeless tobacco: Former Systems developer     Comment: Quit 50 yrs ago as of 2014  . Alcohol Use: 1.8 oz/week    3 Cans of beer per week     Comment: occasionally    Review of Systems  Constitutional: Negative for fever and chills.  Respiratory: Negative for shortness of breath.   Cardiovascular: Negative for chest pain and palpitations.  Gastrointestinal: Negative for nausea, vomiting, abdominal pain and diarrhea.       "hiccups"   All other systems reviewed and are  negative.     Allergies  Review of patient's allergies indicates no known allergies.  Home Medications   Current Outpatient Rx  Name  Route  Sig  Dispense  Refill  . atorvastatin (LIPITOR) 80 MG tablet   Oral   Take 80 mg by mouth daily at 6 PM.          . chlorproMAZINE (THORAZINE) 25 MG tablet   Oral   Take 25 mg by mouth 3 (three) times daily as needed for hiccoughs.         . hydrochlorothiazide (HYDRODIURIL) 25 MG tablet   Oral   Take 25 mg by mouth daily.          Marland Kitchen losartan (COZAAR) 100 MG tablet   Oral   Take 100 mg by mouth every morning.         . methocarbamol (ROBAXIN) 500 MG tablet   Oral   Take 1 tablet (500 mg total) by mouth every 6 (six) hours as needed for muscle spasms.   60 tablet   1   . mometasone (NASONEX) 50 MCG/ACT nasal spray   Nasal   Place 2 sprays into the nose as needed.          Marland Kitchen oxyCODONE (OXY IR/ROXICODONE) 5 MG immediate release tablet   Oral  Take 5-10 mg by mouth every 3 (three) hours as needed for severe pain.         . rivaroxaban (XARELTO) 10 MG TABS tablet   Oral   Take 1 tablet (10 mg total) by mouth daily with breakfast.   18 tablet   0   . senna (SENOKOT) 8.6 MG TABS tablet   Oral   Take 2 tablets by mouth daily as needed for mild constipation.         . traMADol (ULTRAM) 50 MG tablet   Oral   Take 50-100 mg by mouth every 6 (six) hours as needed for moderate pain.         . famotidine (PEPCID) 20 MG tablet   Oral   Take 1 tablet (20 mg total) by mouth 2 (two) times daily.   30 tablet   0   . polyethylene glycol (MIRALAX / GLYCOLAX) packet   Oral   Take 17 g by mouth 2 (two) times daily.   14 each   0    BP 185/65  Pulse 80  Temp(Src) 98.8 F (37.1 C) (Oral)  Resp 18  SpO2 97% Physical Exam  Nursing note and vitals reviewed. Constitutional: He appears well-developed and well-nourished.  Pt lying comfortably in exam bed, NAD.    HENT:  Head: Normocephalic and atraumatic.  Eyes:  Conjunctivae are normal. No scleral icterus.  Neck: Normal range of motion.  Cardiovascular: Normal rate, regular rhythm and normal heart sounds.   Pulmonary/Chest: Effort normal and breath sounds normal. No respiratory distress. He has no wheezes. He has no rales. He exhibits no tenderness.  No respiratory distress, able to speak in full sentences w/o difficulty. Lungs: CTAB. No chest wall tenderness.  Intermittent hiccups present.   Abdominal: Soft. Bowel sounds are normal. He exhibits no distension and no mass. There is no tenderness. There is no rebound and no guarding.  Soft, non-distended, non-tender. No CVAT  Musculoskeletal: Normal range of motion.  Neurological: He is alert.  Skin: Skin is warm and dry.    ED Course  Procedures (including critical care time) Labs Review Labs Reviewed - No data to display Imaging Review Dg Chest 2 View  10/11/2013   CLINICAL DATA:  Shortness of breath.  EXAM: CHEST - 2 VIEW  COMPARISON:  09/27/2013  FINDINGS: The heart is mildly enlarged. There is no evidence of pulmonary edema, consolidation, pneumothorax, nodule or pleural fluid. The bony thorax shows degenerative changes of the thoracic spine which appears stable.  IMPRESSION: Mild cardiomegaly.  No acute findings.   Electronically Signed   By: Aletta Edouard M.D.   On: 10/11/2013 22:04     EKG Interpretation   Date/Time:  Monday October 11 2013 22:06:28 EDT Ventricular Rate:  74 PR Interval:  152 QRS Duration: 85 QT Interval:  427 QTC Calculation: 474 R Axis:   49 Text Interpretation:  Sinus rhythm Borderline T wave abnormalities      MDM   Final diagnoses:  Hiccups  Constipation    Pt is an 80yo male presenting to ED c/o hiccups.  Pt also reports hx of constipation. Denies chest pain or difficulty breathing.  Vitals: unremarkable.  NAD.  During exam, intermittent hiccups present.  Discussed pt with Dr. Alvino Chapel. EKG and CXR ordered-unremarkable.   Will discharge home with  pepcid, advised to double dose of mirilax for constipation. Advised to f/u with PCP. Return precautions provided. Pt verbalized understanding and agreement with tx plan.     Junie Panning  Hilda Blades, PA-C 10/11/13 2342

## 2013-10-11 NOTE — ED Provider Notes (Signed)
Medical screening examination/treatment/procedure(s) were conducted as a shared visit with non-physician practitioner(s) and myself.  I personally evaluated the patient during the encounter.   EKG Interpretation   Date/Time:  Monday October 11 2013 22:06:28 EDT Ventricular Rate:  74 PR Interval:  152 QRS Duration: 85 QT Interval:  427 QTC Calculation: 474 R Axis:   49 Text Interpretation:  Sinus rhythm Borderline T wave abnormalities No  significant change since last tracing Confirmed by Alvino Chapel  MD, Ovid Curd  [phone]) on 10/11/2013 11:56:50 PM      Patient with pickups since surgery. Worse after eating. Benign exam. EKG and x-ray reassuring. Doubt cardiac cause. Patient be discharged to followup as needed  Cheveyo Riling. Alvino Chapel, MD 10/11/13 [phone]

## 2013-10-12 NOTE — ED Notes (Signed)
Report called to [place], LPN at [facility]. [facility] called for transport.

## 2013-10-14 ENCOUNTER — Non-Acute Institutional Stay (SKILLED_NURSING_FACILITY): Payer: Medicare Other | Admitting: Internal Medicine

## 2013-10-14 DIAGNOSIS — R066 Hiccough: Secondary | ICD-10-CM

## 2013-10-14 DIAGNOSIS — Z96659 Presence of unspecified artificial knee joint: Secondary | ICD-10-CM

## 2013-10-14 DIAGNOSIS — I10 Essential (primary) hypertension: Secondary | ICD-10-CM

## 2013-10-14 DIAGNOSIS — K5900 Constipation, unspecified: Secondary | ICD-10-CM

## 2013-10-14 DIAGNOSIS — D62 Acute posthemorrhagic anemia: Secondary | ICD-10-CM

## 2013-10-14 DIAGNOSIS — E785 Hyperlipidemia, unspecified: Secondary | ICD-10-CM

## 2013-10-20 ENCOUNTER — Non-Acute Institutional Stay (SKILLED_NURSING_FACILITY): Payer: Medicare Other | Admitting: Adult Health

## 2013-10-20 ENCOUNTER — Encounter: Payer: Self-pay | Admitting: Adult Health

## 2013-10-20 DIAGNOSIS — M1710 Unilateral primary osteoarthritis, unspecified knee: Secondary | ICD-10-CM

## 2013-10-20 DIAGNOSIS — E785 Hyperlipidemia, unspecified: Secondary | ICD-10-CM

## 2013-10-20 DIAGNOSIS — I10 Essential (primary) hypertension: Secondary | ICD-10-CM

## 2013-10-20 DIAGNOSIS — IMO0002 Reserved for concepts with insufficient information to code with codable children: Secondary | ICD-10-CM

## 2013-10-20 DIAGNOSIS — M179 Osteoarthritis of knee, unspecified: Secondary | ICD-10-CM

## 2013-10-20 DIAGNOSIS — K5900 Constipation, unspecified: Secondary | ICD-10-CM

## 2013-10-20 DIAGNOSIS — D62 Acute posthemorrhagic anemia: Secondary | ICD-10-CM

## 2013-10-20 NOTE — Progress Notes (Signed)
Patient ID: Christopher Shepherd, male   DOB: 1926/10/22, 80 y.o.   MRN: [id number]               PROGRESS NOTE  DATE: 10/20/13  FACILITY: Nursing Home Location: [facility]  LEVEL OF CARE: SNF (31)  Acute Visit  CHIEF COMPLAINT:  Discharge Notes  HISTORY OF PRESENT ILLNESS: This is an 80 year old male who is for discharge home with Home health PT, OT and Nursing. DME: Rolling walker and bedside commode. He has been admitted to [facility] on 10/07/13 from [facility] with Osteoarthritis S/P Right Total Knee Arthroplasty. He has been admitted for a short-term rehabilitation.  REASSESSMENT OF ONGOING PROBLEM(S):  HTN: Pt 's HTN remains stable.  Denies CP, sob, DOE, pedal edema, headaches, dizziness or visual disturbances.  No complications from the medications currently being used.  Last BP : 124/80  ANEMIA: The anemia has been stable. The patient denies fatigue, melena or hematochezia. No complications from the medications currently being used.  4/15 hgb 9.2  CONSTIPATION: The constipation remains stable. No complications from the medications presently being used. Patient denies ongoing constipation, abdominal pain, nausea or vomiting.   PAST MEDICAL HISTORY : Reviewed.  No changes.  CURRENT MEDICATIONS: Reviewed per [facility]  REVIEW OF SYSTEMS:  GENERAL: no change in appetite, no fatigue, no weight changes, no fever, chills or weakness RESPIRATORY: no cough, SOB, DOE, wheezing, hemoptysis,  CARDIAC: no chest pain, edema or palpitations GI: no abdominal pain, diarrhea, heart burn, nausea or vomiting, +constipation  PHYSICAL EXAMINATION  GENERAL: no acute distress, normal body habitus NECK: supple, trachea midline, no neck masses, no thyroid tenderness, no thyromegaly LYMPHATICS: no LAN in the neck, no supraclavicular LAN RESPIRATORY: breathing is even & unlabored, BS CTAB CARDIAC: RRR, no murmur,no extra heart sounds, no edema GI: abdomen soft, normal BS, no  masses, no tenderness, no hepatomegaly, no splenomegaly EXTREMITIES: able to move all 4 extremities PSYCHIATRIC: the patient is alert & oriented to person, affect & behavior appropriate  LABS/RADIOLOGY: 10/18/13 WBC 8.0 hemoglobin 9.2 hematocrit 30.6 10/11/13 WBC 10.9 hemoglobin 8.9 hematocrit 29.4  Labs reviewed: Basic Metabolic Panel:  Recent Labs  09/27/13 1100 10/05/13 0413 10/06/13 0418  NA 141 140 140  K 3.7 4.1 4.5  CL 103 106 102  CO2 27 24 29   GLUCOSE 88 130* 134*  BUN 14 13 17   CREATININE 1.38* 1.28 1.37*  CALCIUM 8.7 8.2* 8.6   Liver Function Tests:  Recent Labs  09/27/13 1100  AST 16  ALT 17  ALKPHOS 81  BILITOT 0.4  PROT 6.6  ALBUMIN 3.2*   CBC:  Recent Labs  10/05/13 0413 10/06/13 0418 10/07/13 0415  WBC 11.5* 13.7* 11.3*  HGB 10.4* 10.1* 8.9*  HCT 31.6* 31.0* 27.2*  MCV 84.0 84.5 85.8  PLT 139* 156 137*    ASSESSMENT/PLAN:  Osteoarthritis she is status post right total knee arthroplasty - for Home health PT, OT and Nursing; Xarelto will be discontinued upon discharge and will start on ASA 81 mg daily   Hyperlipidemia - continue Lipitor Hypertension - well controlled; continue HCTZ and losartan Anemia - stable Constipation - continue Miralax and Senna-S Hiccups - Resolved   I have filled out patient's discharge paperwork and written prescriptions.  Patient will receive home health PT, OT and Nursing.  DME provided: Rolling walker and bedside commode  Total discharge time: Greater than 30 minutes Discharge time involved coordination of the discharge process with Education officer, museum, nursing staff and  therapy department. Medical justification for home health services/DME verified.    CPT CODE: 60454   Seth Bake - NP [facility] [phone]

## 2013-10-23 ENCOUNTER — Encounter: Payer: Self-pay | Admitting: Internal Medicine

## 2013-10-23 DIAGNOSIS — Z96659 Presence of unspecified artificial knee joint: Secondary | ICD-10-CM | POA: Insufficient documentation

## 2013-10-23 NOTE — Assessment & Plan Note (Signed)
Pt developed hiccups and was started on thorazine 25 mg TID;pt refuses to let me increASE the dose of thorazine

## 2013-10-23 NOTE — Assessment & Plan Note (Signed)
Continue losartan and HCTZ. 

## 2013-10-23 NOTE — Assessment & Plan Note (Signed)
HB dropped from 10.4 to 8.9; expected and asymptomatic

## 2013-10-23 NOTE — Assessment & Plan Note (Signed)
Continue lipitor  ?

## 2013-10-23 NOTE — Assessment & Plan Note (Signed)
Developed after arrival to SNF and pt was started on miralax

## 2013-10-23 NOTE — Assessment & Plan Note (Signed)
Oxycodone, robaxin and xarelto as prophylaxis;admitted for OT/PT

## 2013-10-23 NOTE — Progress Notes (Signed)
MRN: [id number] Name: Christopher Shepherd  Sex: male Age: 80 y.o. DOB: 07/02/1926  [place] #: [place] Facility/Room: [id number] Level Of Care: SNF Provider: Hennie Duos Emergency Contacts: Extended Emergency Contact Information Primary Emergency Contact: Bonapart,Barbara Address: [address]          [place], [place] [address] [place] Phone: [phone] Mobile Phone: [phone] Relation: Spouse Secondary Emergency Contact: Karie Mainland Address: [address] , [place] Phone: [phone] Relation: Daughter  Code Status: FULL  Allergies: Review of patient's allergies indicates no known allergies.  Chief Complaint  Patient presents with  . nursing home admission    HPI: Patient is 80 y.o. male who is admitted to SNF after R knee arthroplasty.  Past Medical History  Diagnosis Date  . Obstructive sleep apnea     uses a C-Pap  . Cough   . Allergic rhinitis   . Hypertension   . Arthritis     osteoarthritis  . Asthma 1994    with bronchitis  . Anemia     Past Surgical History  Procedure Laterality Date  . None    . Colonoscopy w/ polypectomy    . Total knee arthroplasty Right 10/04/2013    Procedure: RIGHT TOTAL KNEE ARTHROPLASTY;  Surgeon: Gearlean Alf, MD;  Location: [facility];  Service: Orthopedics;  Laterality: Right;      Medication List       This list is accurate as of: 10/14/13 11:59 PM.  Always use your most recent med list.               atorvastatin 80 MG tablet  Commonly known as:  LIPITOR  Take 80 mg by mouth daily at 6 PM.     chlorproMAZINE 25 MG tablet  Commonly known as:  THORAZINE  Take 25 mg by mouth 3 (three) times daily as needed for hiccoughs.     famotidine 20 MG tablet  Commonly known as:  PEPCID  Take 1 tablet (20 mg total) by mouth 2 (two) times daily.     hydrochlorothiazide 25 MG tablet  Commonly known as:  HYDRODIURIL  Take 25 mg by mouth daily.     losartan  100 MG tablet  Commonly known as:  COZAAR  Take 100 mg by mouth every morning.     methocarbamol 500 MG tablet  Commonly known as:  ROBAXIN  Take 1 tablet (500 mg total) by mouth every 6 (six) hours as needed for muscle spasms.     NASONEX 50 MCG/ACT nasal spray  Generic drug:  mometasone  Place 2 sprays into the nose as needed.     oxyCODONE 5 MG immediate release tablet  Commonly known as:  Oxy IR/ROXICODONE  Take 5-10 mg by mouth every 3 (three) hours as needed for severe pain.     polyethylene glycol packet  Commonly known as:  MIRALAX / GLYCOLAX  Take 17 g by mouth 2 (two) times daily.     rivaroxaban 10 MG Tabs tablet  Commonly known as:  XARELTO  Take 1 tablet (10 mg total) by mouth daily with breakfast.     traMADol 50 MG tablet  Commonly known as:  ULTRAM  Take 50-100 mg by mouth every 6 (six) hours as needed for moderate pain.        No orders of the defined types were placed in this encounter.    Immunization History  Administered  Date(s) Administered  . Influenza Split 04/12/2011, 03/01/2012  . Influenza Whole 04/01/2008, 04/26/2009  . Influenza,inj,Quad PF,36+ Mos 04/06/2013    History  Substance Use Topics  . Smoking status: Former Smoker -- 0.20 packs/day for 15 years    Types: Cigarettes    Quit date: 07/01/1968  . Smokeless tobacco: Former Systems developer     Comment: Quit 50 yrs ago as of 2014  . Alcohol Use: 1.8 oz/week    3 Cans of beer per week     Comment: occasionally    Family history is noncontributory    Review of Systems  DATA OBTAINED: from patient; pt c/o hiccups, pt c/o "service", and many non medical things GENERAL: Feels well no fevers, fatigue, appetite changes SKIN: No itching, rash  EYES: No eye pain, redness, discharge EARS: No earache, tinnitus, change in hearing NOSE: No congestion, drainage or bleeding  MOUTH/THROAT: No mouth or tooth pain, No sore throat, No difficulty chewing or swallowing  RESPIRATORY: No cough, wheezing,  SOB CARDIAC: No chest pain, palpitations, lower extremity edema  GI: No abdominal pain, No N/V/D or constipation, No heartburn or reflux  GU: No dysuria, frequency or urgency, or incontinence  MUSCULOSKELETAL: No unrelieved bone/joint pain NEUROLOGIC: No headache, dizziness or focal weakness PSYCHIATRIC: No overt anxiety or sadness. No behavior issue.   Filed Vitals:   10/23/13 1807  BP: 160/69  Pulse: 69  Temp: 98.6 F (37 C)  Resp: 20    Physical Exam  GENERAL APPEARANCE: Alert, conversant. Appropriately groomed. No acute distress.  SKIN: No diaphoresis rash HEAD: Normocephalic, atraumatic  EYES: Conjunctiva/lids clear. Pupils round, reactive. EOMs intact.  EARS: External exam WNL, canals clear. Hearing grossly normal.  NOSE: No deformity or discharge.  MOUTH/THROAT: Lips w/o lesions. RESPIRATORY: Breathing is even, unlabored. Lung sounds are clear   CARDIOVASCULAR: Heart RRR no murmurs, rubs or gallops. No peripheral edema.   GASTROINTESTINAL: Abdomen is soft, non-tender, not distended w/ normal bowel sounds GENITOURINARY: Bladder non tender, not distended  MUSCULOSKELETAL: s/p R knee arthroplasty NEUROLOGIC: Oriented X3. Cranial nerves 2-12 grossly intact. Moves all extremities no tremor. PSYCHIATRIC: very grumpy, no behavioral issues  Patient Active Problem List   Diagnosis Date Noted  . S/P total knee arthroplasty 10/23/2013  . Hyperlipidemia 10/08/2013  . Constipation 10/08/2013  . Essential hypertension, benign 10/08/2013  . Hiccups 10/08/2013  . Acute blood loss anemia 10/06/2013  . OA (osteoarthritis) of knee 10/04/2013  . Onychomycosis 10/30/2012  . Pain in joint, ankle and foot 10/30/2012  . Allergic rhinitis due to pollen 09/05/2010  . OBSTRUCTIVE SLEEP APNEA 04/16/2007  . Allergic-infective asthma 04/16/2007  . COUGH 04/16/2007    CBC    Component Value Date/Time   WBC 11.3* 10/07/2013 0415   RBC 3.17* 10/07/2013 0415   HGB 8.9* 10/07/2013 0415   HCT  27.2* 10/07/2013 0415   PLT 137* 10/07/2013 0415   MCV 85.8 10/07/2013 0415   LYMPHSABS 1.5 06/19/2012 1230   MONOABS 0.4 06/19/2012 1230   EOSABS 0.3 06/19/2012 1230   BASOSABS 0.0 06/19/2012 1230    CMP     Component Value Date/Time   NA 140 10/06/2013 0418   K 4.5 10/06/2013 0418   CL 102 10/06/2013 0418   CO2 29 10/06/2013 0418   GLUCOSE 134* 10/06/2013 0418   BUN 17 10/06/2013 0418   CREATININE 1.37* 10/06/2013 0418   CALCIUM 8.6 10/06/2013 0418   PROT 6.6 09/27/2013 1100   ALBUMIN 3.2* 09/27/2013 1100   AST 16 09/27/2013 1100  ALT 17 09/27/2013 1100   ALKPHOS 81 09/27/2013 1100   BILITOT 0.4 09/27/2013 1100   GFRNONAA 46* 10/06/2013 0418   GFRAA 53* 10/06/2013 0418    Assessment and Plan  S/P total knee arthroplasty Oxycodone, robaxin and xarelto as prophylaxis;admitted for OT/PT  Essential hypertension, benign Continue losartan and HCTZ  Constipation Developed after arrival to SNF and pt was started on miralax  Acute blood loss anemia HB dropped from 10.4 to 8.9; expected and asymptomatic  Hyperlipidemia Continue lipitor  Hiccups Pt developed hiccups and was started on thorazine 25 mg TID;pt refuses to let me increASE the dose of thorazine    Hennie Duos, MD

## 2013-11-02 ENCOUNTER — Ambulatory Visit (INDEPENDENT_AMBULATORY_CARE_PROVIDER_SITE_OTHER): Payer: Medicare Other

## 2013-11-02 DIAGNOSIS — J309 Allergic rhinitis, unspecified: Secondary | ICD-10-CM

## 2013-11-09 ENCOUNTER — Ambulatory Visit (INDEPENDENT_AMBULATORY_CARE_PROVIDER_SITE_OTHER): Payer: Medicare Other

## 2013-11-09 DIAGNOSIS — J309 Allergic rhinitis, unspecified: Secondary | ICD-10-CM

## 2013-11-15 ENCOUNTER — Ambulatory Visit: Payer: Medicare Other | Attending: Orthopedic Surgery | Admitting: Physical Therapy

## 2013-11-15 DIAGNOSIS — IMO0001 Reserved for inherently not codable concepts without codable children: Secondary | ICD-10-CM | POA: Insufficient documentation

## 2013-11-15 DIAGNOSIS — M25669 Stiffness of unspecified knee, not elsewhere classified: Secondary | ICD-10-CM | POA: Insufficient documentation

## 2013-11-15 DIAGNOSIS — M25569 Pain in unspecified knee: Secondary | ICD-10-CM | POA: Insufficient documentation

## 2013-11-18 ENCOUNTER — Ambulatory Visit: Payer: Medicare Other | Admitting: Physical Therapy

## 2013-11-23 ENCOUNTER — Ambulatory Visit: Payer: Medicare Other | Admitting: Physical Therapy

## 2013-11-24 ENCOUNTER — Encounter: Payer: Self-pay | Admitting: Podiatry

## 2013-11-24 ENCOUNTER — Ambulatory Visit (INDEPENDENT_AMBULATORY_CARE_PROVIDER_SITE_OTHER): Payer: Medicare Other | Admitting: Podiatry

## 2013-11-24 VITALS — BP 155/58 | HR 74

## 2013-11-24 DIAGNOSIS — M79609 Pain in unspecified limb: Secondary | ICD-10-CM

## 2013-11-24 DIAGNOSIS — B351 Tinea unguium: Secondary | ICD-10-CM

## 2013-11-24 DIAGNOSIS — M79606 Pain in leg, unspecified: Secondary | ICD-10-CM | POA: Insufficient documentation

## 2013-11-24 NOTE — Progress Notes (Signed)
Subjective:  80 year old male presents complaining of painful feet from abnormal nails and requesting toe nails trimmed.  Recently had right knee replacement done and is doing well.   Objective:  HAV with bunion bilateral. MMN x 10.  Pedal pulses are faintly palpable in DP, not palpable in PT bilateral.  Severe dry scaly skin plantar surface bilateral.   Assessment: Onychomycosis x 10.  Hallux valgus with bunion bilateral, asymptomatic.   Plan: Debrided all nails x 10.  Return in 3 months or as needed

## 2013-11-24 NOTE — Patient Instructions (Signed)
Seen for hypertrophic nails. All nails debrided. Return in 3 months or as needed.  

## 2013-11-25 ENCOUNTER — Ambulatory Visit: Payer: Medicare Other | Admitting: Physical Therapy

## 2013-11-30 ENCOUNTER — Ambulatory Visit: Payer: Medicare Other | Attending: Orthopedic Surgery | Admitting: Physical Therapy

## 2013-11-30 DIAGNOSIS — M25569 Pain in unspecified knee: Secondary | ICD-10-CM | POA: Insufficient documentation

## 2013-11-30 DIAGNOSIS — M25669 Stiffness of unspecified knee, not elsewhere classified: Secondary | ICD-10-CM | POA: Insufficient documentation

## 2013-11-30 DIAGNOSIS — IMO0001 Reserved for inherently not codable concepts without codable children: Secondary | ICD-10-CM | POA: Insufficient documentation

## 2013-12-02 ENCOUNTER — Ambulatory Visit: Payer: Medicare Other | Admitting: Physical Therapy

## 2013-12-07 ENCOUNTER — Ambulatory Visit: Payer: Medicare Other | Admitting: Physical Therapy

## 2013-12-08 ENCOUNTER — Ambulatory Visit (INDEPENDENT_AMBULATORY_CARE_PROVIDER_SITE_OTHER): Payer: Medicare Other

## 2013-12-08 DIAGNOSIS — J309 Allergic rhinitis, unspecified: Secondary | ICD-10-CM

## 2013-12-09 ENCOUNTER — Ambulatory Visit: Payer: Medicare Other | Admitting: Physical Therapy

## 2013-12-15 ENCOUNTER — Ambulatory Visit: Payer: Medicare Other | Admitting: Physical Therapy

## 2013-12-16 ENCOUNTER — Ambulatory Visit (INDEPENDENT_AMBULATORY_CARE_PROVIDER_SITE_OTHER): Payer: Medicare Other

## 2013-12-16 DIAGNOSIS — J309 Allergic rhinitis, unspecified: Secondary | ICD-10-CM

## 2013-12-21 ENCOUNTER — Ambulatory Visit: Payer: Medicare Other

## 2013-12-29 ENCOUNTER — Ambulatory Visit (INDEPENDENT_AMBULATORY_CARE_PROVIDER_SITE_OTHER): Payer: Medicare Other

## 2013-12-29 DIAGNOSIS — J309 Allergic rhinitis, unspecified: Secondary | ICD-10-CM

## 2014-01-03 ENCOUNTER — Ambulatory Visit: Payer: Medicare Other

## 2014-01-04 ENCOUNTER — Ambulatory Visit (INDEPENDENT_AMBULATORY_CARE_PROVIDER_SITE_OTHER): Payer: Medicare Other

## 2014-01-04 DIAGNOSIS — J309 Allergic rhinitis, unspecified: Secondary | ICD-10-CM

## 2014-01-12 ENCOUNTER — Ambulatory Visit (INDEPENDENT_AMBULATORY_CARE_PROVIDER_SITE_OTHER): Payer: Medicare Other

## 2014-01-12 DIAGNOSIS — J309 Allergic rhinitis, unspecified: Secondary | ICD-10-CM

## 2014-01-18 ENCOUNTER — Ambulatory Visit (INDEPENDENT_AMBULATORY_CARE_PROVIDER_SITE_OTHER): Payer: Medicare Other

## 2014-01-18 DIAGNOSIS — J309 Allergic rhinitis, unspecified: Secondary | ICD-10-CM

## 2014-01-25 ENCOUNTER — Ambulatory Visit: Payer: Medicare Other

## 2014-01-25 ENCOUNTER — Ambulatory Visit: Payer: Medicare Other | Admitting: Internal Medicine

## 2014-02-08 ENCOUNTER — Ambulatory Visit (INDEPENDENT_AMBULATORY_CARE_PROVIDER_SITE_OTHER): Payer: Medicare Other

## 2014-02-08 DIAGNOSIS — J309 Allergic rhinitis, unspecified: Secondary | ICD-10-CM

## 2014-02-15 ENCOUNTER — Ambulatory Visit: Payer: Medicare Other

## 2014-02-22 ENCOUNTER — Ambulatory Visit (INDEPENDENT_AMBULATORY_CARE_PROVIDER_SITE_OTHER): Payer: Medicare Other

## 2014-02-22 DIAGNOSIS — J309 Allergic rhinitis, unspecified: Secondary | ICD-10-CM

## 2014-02-25 ENCOUNTER — Encounter: Payer: Self-pay | Admitting: Podiatry

## 2014-02-25 ENCOUNTER — Ambulatory Visit (INDEPENDENT_AMBULATORY_CARE_PROVIDER_SITE_OTHER): Payer: Medicare Other | Admitting: Podiatry

## 2014-02-25 VITALS — BP 151/61 | HR 73 | Ht 64.0 in | Wt 204.0 lb

## 2014-02-25 DIAGNOSIS — M79609 Pain in unspecified limb: Secondary | ICD-10-CM

## 2014-02-25 DIAGNOSIS — B351 Tinea unguium: Secondary | ICD-10-CM

## 2014-02-25 DIAGNOSIS — M79606 Pain in leg, unspecified: Secondary | ICD-10-CM

## 2014-02-25 NOTE — Progress Notes (Signed)
Subjective:  80 year old male presents complaining of painful feet from abnormal nails and requesting toe nails trimmed.  Right knee is still doing well following the surgery.   Objective:  Thick hypertrophic nails x 10.  HAV with bunion bilateral. MMN x 10.  Pedal pulses are faintly palpable in DP, not palpable in PT bilateral.  Severe dry scaly skin plantar surface bilateral.   Assessment: Onychomycosis x 10.  Hallux valgus with bunion bilateral, asymptomatic.   Plan: Debrided all nails x 10.  Return in 3 months or as needed

## 2014-02-25 NOTE — Patient Instructions (Signed)
Seen for hypertrophic nails. All nails debrided. Return in 3 months or as needed.  

## 2014-03-01 ENCOUNTER — Ambulatory Visit (INDEPENDENT_AMBULATORY_CARE_PROVIDER_SITE_OTHER): Payer: Medicare Other

## 2014-03-01 DIAGNOSIS — J309 Allergic rhinitis, unspecified: Secondary | ICD-10-CM

## 2014-03-08 ENCOUNTER — Ambulatory Visit: Payer: Medicare Other

## 2014-03-15 ENCOUNTER — Encounter: Payer: Self-pay | Admitting: Internal Medicine

## 2014-03-15 ENCOUNTER — Ambulatory Visit (INDEPENDENT_AMBULATORY_CARE_PROVIDER_SITE_OTHER): Payer: Medicare Other

## 2014-03-15 ENCOUNTER — Ambulatory Visit: Payer: Medicare Other | Admitting: Internal Medicine

## 2014-03-15 VITALS — BP 142/80 | HR 62 | Ht 66.0 in | Wt 203.0 lb

## 2014-03-15 DIAGNOSIS — Z23 Encounter for immunization: Secondary | ICD-10-CM

## 2014-03-15 DIAGNOSIS — J309 Allergic rhinitis, unspecified: Secondary | ICD-10-CM

## 2014-03-15 NOTE — Patient Instructions (Signed)
Flu vax  We can continue BIPAP 20/8  Advanced  We can continue allergy vaccine 1:10 GH  Please call as needed

## 2014-03-15 NOTE — Progress Notes (Signed)
Subjective:    Patient ID: Christopher Shepherd, male    DOB: 09-25-1926, 80 y.o.   MRN: [id number]  HPI 03/21/11-51--year-old male never smoker followed for cough, asthma, allergic rhinitis, obstructive sleep apnea Last here October 18,2011 With season change he is beginning to wheeze a little at night and throat feels more congested. He denies nasal congestion sneezing or drainage and does not think he has a cold. He is out of inhalers. He continues allergy vaccine and believes that helps him. Dr. Karlton Lemon manages his BiPAP which he continues to use all night every night 20/8, for sleep apnea.  02/21/12- 64--year-old male never smoker followed for cough, asthma, allergic rhinitis, obstructive sleep apnea BiPAP I 20/ E 8 Sleep apnea control good and he is compliant with BiPAP. Working on his weight. These Still on vaccine and doing well with it; cough-dry; using Nasonex as well Describes 2 weeks of dry cough and throat tickle blamed on the season change. Mild head congestion and postnasal drip. Clear mucus. No fever or sore throat. Qvar inhaler was too expensive. Admits occasional wheeze.  02/01/13- 53--year-old male never smoker followed for cough, asthma, allergic rhinitis, obstructive sleep apnea BiPAP I 20/ E 8 Acute Visit.  C/o dry cough, wheezing, chest tightness, nasal congestion, and PND x 1 wk. Asking for neb and depo. "Same thing around this time every year" No fever or purulent.  03/15/14-  47--year-old male never smoker followed for cough, asthma, allergic rhinitis, obstructive sleep apnea FOLLOWS FOR: Pt states his allergy vaccine is going well, no complaints. Pt states his breathing is unchanged since last OV. Pt denies SOB, cough and CP/tightness. 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 Allergy vaccine 1:10 GH    Review of Systems- see HPI Constitutional:   No-   weight loss, night sweats, fevers, chills, fatigue, lassitude. HEENT:   No-   headaches, difficulty swallowing, tooth/dental problems, sore throat,       No-  sneezing, itching, ear ache,    +nasal congestion, +post nasal drip,  CV:  No-   chest pain, orthopnea, PND, swelling in lower extremities, anasarca,  dizziness, palpitations Resp: No-   shortness of breath with exertion or at rest.              No-   productive cough,  + non-productive cough,  No-  coughing up of blood.              No-   change in color of mucus.  + wheezing.   Skin: No-   rash or lesions. GI:  No-   heartburn, indigestion, abdominal pain, nausea, vomiting,  GU:  MS:  No-   joint pain or swelling.   Neuro- nothing unusual  Psych:  No- change in mood or affect. No depression or anxiety.  No memory loss.   Objective:   Physical Exam General- Alert, Oriented, Affect-appropriate, Distress- none acute; obese Skin- rash-none, lesions- none, excoriation- none Lymphadenopathy- none Head- atraumatic            Eyes- Gross vision intact, PERRLA, conjunctivae clear secretions            Ears- Hearing, canals-normal            Nose- + turbinate edema, no-Septal dev, mucus, polyps, erosion, perforation             Throat- Mallampati IV , mucosa dry , drainage- none, tonsils- atrophic Neck- flexible , trachea midline,  no stridor , thyroid nl, carotid no bruit Chest - symmetrical excursion , unlabored           Heart/CV- RRR with skipped beats , no murmur , no gallop  , no rub, nl s1 s2                           - JVD- none , edema- none, stasis changes- none, varices- none           Lung- clear to P&A, + shallow, wheeze- none, cough+ dry , dullness-none, rub- none           Chest wall-  Abd-  Br/ Gen/ Rectal- Not done, not indicated Extrem- cyanosis- none, clubbing, none, atrophy- none, strength- nl Neuro- grossly intact to observation Assessment & Plan:

## 2014-03-22 ENCOUNTER — Ambulatory Visit (INDEPENDENT_AMBULATORY_CARE_PROVIDER_SITE_OTHER): Payer: Medicare Other

## 2014-03-22 DIAGNOSIS — J309 Allergic rhinitis, unspecified: Secondary | ICD-10-CM

## 2014-03-29 ENCOUNTER — Ambulatory Visit: Payer: Medicare Other

## 2014-04-05 ENCOUNTER — Ambulatory Visit (INDEPENDENT_AMBULATORY_CARE_PROVIDER_SITE_OTHER): Payer: Medicare Other

## 2014-04-05 DIAGNOSIS — J309 Allergic rhinitis, unspecified: Secondary | ICD-10-CM

## 2014-04-07 ENCOUNTER — Encounter: Payer: Self-pay | Admitting: Internal Medicine

## 2014-04-12 ENCOUNTER — Ambulatory Visit (INDEPENDENT_AMBULATORY_CARE_PROVIDER_SITE_OTHER): Payer: Medicare Other

## 2014-04-12 DIAGNOSIS — J309 Allergic rhinitis, unspecified: Secondary | ICD-10-CM

## 2014-04-19 ENCOUNTER — Ambulatory Visit (INDEPENDENT_AMBULATORY_CARE_PROVIDER_SITE_OTHER): Payer: Medicare Other

## 2014-04-19 DIAGNOSIS — J309 Allergic rhinitis, unspecified: Secondary | ICD-10-CM

## 2014-04-26 ENCOUNTER — Ambulatory Visit: Payer: Medicare Other

## 2014-05-03 ENCOUNTER — Ambulatory Visit (INDEPENDENT_AMBULATORY_CARE_PROVIDER_SITE_OTHER): Payer: Medicare Other

## 2014-05-03 DIAGNOSIS — J309 Allergic rhinitis, unspecified: Secondary | ICD-10-CM

## 2014-05-04 ENCOUNTER — Encounter: Payer: Self-pay | Admitting: Internal Medicine

## 2014-05-10 ENCOUNTER — Ambulatory Visit (INDEPENDENT_AMBULATORY_CARE_PROVIDER_SITE_OTHER): Payer: Medicare Other

## 2014-05-10 DIAGNOSIS — J309 Allergic rhinitis, unspecified: Secondary | ICD-10-CM

## 2014-05-17 ENCOUNTER — Ambulatory Visit: Payer: Medicare Other

## 2014-05-18 ENCOUNTER — Ambulatory Visit (INDEPENDENT_AMBULATORY_CARE_PROVIDER_SITE_OTHER): Payer: Medicare Other

## 2014-05-18 DIAGNOSIS — J309 Allergic rhinitis, unspecified: Secondary | ICD-10-CM

## 2014-05-24 ENCOUNTER — Ambulatory Visit (INDEPENDENT_AMBULATORY_CARE_PROVIDER_SITE_OTHER): Payer: Medicare Other

## 2014-05-24 DIAGNOSIS — J309 Allergic rhinitis, unspecified: Secondary | ICD-10-CM

## 2014-05-30 ENCOUNTER — Encounter: Payer: Self-pay | Admitting: Podiatry

## 2014-05-30 ENCOUNTER — Ambulatory Visit (INDEPENDENT_AMBULATORY_CARE_PROVIDER_SITE_OTHER): Payer: Medicare Other | Admitting: Podiatry

## 2014-05-30 VITALS — BP 168/69 | HR 60

## 2014-05-30 DIAGNOSIS — M79606 Pain in leg, unspecified: Secondary | ICD-10-CM

## 2014-05-30 DIAGNOSIS — B351 Tinea unguium: Secondary | ICD-10-CM

## 2014-05-30 NOTE — Progress Notes (Signed)
Subjective:  80 year old male presents complaining of painful feet from abnormal nails and requesting toe nails trimmed.  No new problems.   Objective:  Thick hypertrophic nails x 10.  HAV with bunion bilateral. MMN x 10.  Pedal pulses are faintly palpable in DP, not palpable in PT bilateral.   Assessment: Onychomycosis x 10.  Hallux valgus with bunion bilateral, asymptomatic.   Plan: Debrided all nails x 10.  Return in 3 months or as needed

## 2014-05-30 NOTE — Patient Instructions (Signed)
Seen for hypertrophic nails. All nails debrided. Return in 3 months or as needed.  

## 2014-05-31 ENCOUNTER — Ambulatory Visit (INDEPENDENT_AMBULATORY_CARE_PROVIDER_SITE_OTHER): Payer: Medicare Other

## 2014-05-31 DIAGNOSIS — J309 Allergic rhinitis, unspecified: Secondary | ICD-10-CM

## 2014-06-02 ENCOUNTER — Ambulatory Visit (INDEPENDENT_AMBULATORY_CARE_PROVIDER_SITE_OTHER): Payer: Medicare Other

## 2014-06-02 DIAGNOSIS — J309 Allergic rhinitis, unspecified: Secondary | ICD-10-CM

## 2014-06-07 ENCOUNTER — Ambulatory Visit (INDEPENDENT_AMBULATORY_CARE_PROVIDER_SITE_OTHER): Payer: Medicare Other

## 2014-06-07 DIAGNOSIS — J309 Allergic rhinitis, unspecified: Secondary | ICD-10-CM

## 2014-06-14 ENCOUNTER — Ambulatory Visit (INDEPENDENT_AMBULATORY_CARE_PROVIDER_SITE_OTHER): Payer: Medicare Other

## 2014-06-14 DIAGNOSIS — J309 Allergic rhinitis, unspecified: Secondary | ICD-10-CM

## 2014-06-21 ENCOUNTER — Ambulatory Visit (INDEPENDENT_AMBULATORY_CARE_PROVIDER_SITE_OTHER): Payer: Medicare Other

## 2014-06-21 DIAGNOSIS — J309 Allergic rhinitis, unspecified: Secondary | ICD-10-CM

## 2014-06-28 ENCOUNTER — Ambulatory Visit (INDEPENDENT_AMBULATORY_CARE_PROVIDER_SITE_OTHER): Payer: Medicare Other

## 2014-06-28 DIAGNOSIS — J309 Allergic rhinitis, unspecified: Secondary | ICD-10-CM

## 2014-07-05 ENCOUNTER — Ambulatory Visit (INDEPENDENT_AMBULATORY_CARE_PROVIDER_SITE_OTHER): Payer: Medicare Other

## 2014-07-05 DIAGNOSIS — J309 Allergic rhinitis, unspecified: Secondary | ICD-10-CM

## 2014-07-06 ENCOUNTER — Encounter: Payer: Self-pay | Admitting: Internal Medicine

## 2014-07-12 ENCOUNTER — Ambulatory Visit (INDEPENDENT_AMBULATORY_CARE_PROVIDER_SITE_OTHER): Payer: Medicare Other

## 2014-07-12 DIAGNOSIS — J309 Allergic rhinitis, unspecified: Secondary | ICD-10-CM

## 2014-07-14 ENCOUNTER — Encounter: Payer: Self-pay | Admitting: Internal Medicine

## 2014-07-14 ENCOUNTER — Ambulatory Visit (INDEPENDENT_AMBULATORY_CARE_PROVIDER_SITE_OTHER): Payer: Medicare Other | Admitting: Internal Medicine

## 2014-07-14 VITALS — BP 132/62 | HR 64 | Ht 66.0 in | Wt 209.6 lb

## 2014-07-14 DIAGNOSIS — J449 Chronic obstructive pulmonary disease, unspecified: Secondary | ICD-10-CM

## 2014-07-14 DIAGNOSIS — G4733 Obstructive sleep apnea (adult) (pediatric): Secondary | ICD-10-CM

## 2014-07-14 DIAGNOSIS — J45998 Other asthma: Secondary | ICD-10-CM

## 2014-07-14 DIAGNOSIS — J441 Chronic obstructive pulmonary disease with (acute) exacerbation: Secondary | ICD-10-CM

## 2014-07-14 DIAGNOSIS — J301 Allergic rhinitis due to pollen: Secondary | ICD-10-CM

## 2014-07-14 MED ORDER — METHYLPREDNISOLONE ACETATE 80 MG/ML IJ SUSP
80.0000 mg | Freq: Once | INTRAMUSCULAR | Status: AC
  Administered 2014-07-14: 80 mg via INTRAMUSCULAR

## 2014-07-14 MED ORDER — LEVALBUTEROL HCL 0.63 MG/3ML IN NEBU
0.6300 mg | INHALATION_SOLUTION | Freq: Once | RESPIRATORY_TRACT | Status: AC
  Administered 2014-07-14: 0.63 mg via RESPIRATORY_TRACT

## 2014-07-14 NOTE — Progress Notes (Signed)
Subjective:    Patient ID: Christopher Shepherd, male    DOB: [birth year], 81 y.o.   MRN: [id number]  HPI 03/21/11-80--year-old male never smoker followed for cough, asthma, allergic rhinitis, obstructive sleep apnea Last here October 18,2011 With season change he is beginning to wheeze a little at night and throat feels more congested. He denies nasal congestion sneezing or drainage and does not think he has a cold. He is out of inhalers. He continues allergy vaccine and believes that helps him. Dr. Karlton Lemon manages his BiPAP which he continues to use all night every night 20/8, for sleep apnea.  02/21/12- 33--year-old male never smoker followed for cough, asthma, allergic rhinitis, obstructive sleep apnea BiPAP I 20/ E 8 Sleep apnea control good and he is compliant with BiPAP. Working on his weight. These Still on vaccine and doing well with it; cough-dry; using Nasonex as well Describes 2 weeks of dry cough and throat tickle blamed on the season change. Mild head congestion and postnasal drip. Clear mucus. No fever or sore throat. Qvar inhaler was too expensive. Admits occasional wheeze.  02/01/13- 67--year-old male never smoker followed for cough, asthma, allergic rhinitis, obstructive sleep apnea BiPAP I 20/ E 8 Acute Visit.  C/o dry cough, wheezing, chest tightness, nasal congestion, and PND x 1 wk. Asking for neb and depo. "Same thing around this time every year" No fever or purulent.  03/15/14-  79--year-old male never smoker followed for cough, asthma, allergic rhinitis, obstructive sleep apnea FOLLOWS FOR: Pt states his allergy vaccine is going well, no complaints. Pt states his breathing is unchanged since last OV. Pt denies SOB, cough and CP/tightness. 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 Allergy vaccine 1:10 [place]  07/14/14- 53--year-old male never smoker followed for cough, asthma, allergic rhinitis, obstructive sleep apnea  Follows For: Prod  cough ( yellow), wheezing, some chest congestion - Denies sob  He feels he needs nebulizer treatment and Depo-Medrol based on past experience. Allergy vaccine still seems like a good idea to him and he is satisfied to continue. Using BiPAP 12/8 "periodically"/Advanced, managed by his primary physician. I reminded him of the usual goal that it be used all night every night but he can discuss it with his primary physician.  Review of Systems- see HPI Constitutional:   No-   weight loss, night sweats, fevers, chills, fatigue, lassitude. HEENT:   No-  headaches, difficulty swallowing, tooth/dental problems, sore throat,       No-  sneezing, itching, ear ache,    +nasal congestion, +post nasal drip,  CV:  No-   chest pain, orthopnea, PND, swelling in lower extremities, anasarca,  dizziness, palpitations Resp: No-   shortness of breath with exertion or at rest.             +  productive cough,  + non-productive cough,  No-  coughing up of blood.             +  change in color of mucus.  + wheezing.   Skin: No-   rash or lesions. GI:  No-   heartburn, indigestion, abdominal pain, nausea, vomiting,  GU:  MS:  No-   joint pain or swelling.   Neuro- nothing unusual  Psych:  No- change in mood or affect. No depression or anxiety.  No memory loss.   Objective:   Physical Exam General- Alert, Oriented, Affect-appropriate, Distress- none acute; obese Skin- rash-none, lesions- none, excoriation- none Lymphadenopathy-  none Head- atraumatic            Eyes- Gross vision intact, PERRLA, conjunctivae clear secretions            Ears- Hearing, canals-normal            Nose- + turbinate edema, no-Septal dev, mucus, polyps, erosion, perforation             Throat- Mallampati IV , mucosa dry , drainage- none, tonsils- atrophic Neck- flexible , trachea midline, no stridor , thyroid nl, carotid no bruit Chest - symmetrical excursion , unlabored           Heart/CV- RRR with skipped beats , no murmur , no gallop   , no rub, nl s1 s2                           - JVD- none , edema- none, stasis changes- none, varices- none           Lung- + few rhonchi, + shallow, wheeze- none, cough+ mild , dullness-none, rub- none           Chest wall-  Abd-  Br/ Gen/ Rectal- Not done, not indicated Extrem- cyanosis- none, clubbing, none, atrophy- none, strength- nl Neuro- grossly intact to observation Assessment & Plan:

## 2014-07-14 NOTE — Patient Instructions (Addendum)
Neb xop 0.63  Depo 80  We can continue allergy vaccine 1:10 [facility]     Continue BIPAP 12/8  Advanced    Managed by Dr Karlton Lemon  Please call as needed

## 2014-07-16 DIAGNOSIS — J441 Chronic obstructive pulmonary disease with (acute) exacerbation: Secondary | ICD-10-CM | POA: Insufficient documentation

## 2014-07-16 NOTE — Assessment & Plan Note (Signed)
Encouraged more regular use and asked him to discuss this with his primary physician who is managing this problem

## 2014-07-16 NOTE — Assessment & Plan Note (Signed)
Risk benefit and cost effectiveness of allergy vaccine discussed, respecting his age. He wishes to continue. Plan-continue allergy vaccine 1:10 [facility]

## 2014-07-16 NOTE — Assessment & Plan Note (Signed)
Plan-nebulizer Xopenex, Depo-Medrol, fluids

## 2014-07-19 ENCOUNTER — Ambulatory Visit (INDEPENDENT_AMBULATORY_CARE_PROVIDER_SITE_OTHER): Payer: Medicare Other

## 2014-07-19 DIAGNOSIS — J309 Allergic rhinitis, unspecified: Secondary | ICD-10-CM

## 2014-07-26 ENCOUNTER — Ambulatory Visit (INDEPENDENT_AMBULATORY_CARE_PROVIDER_SITE_OTHER): Payer: Medicare Other

## 2014-07-26 DIAGNOSIS — J309 Allergic rhinitis, unspecified: Secondary | ICD-10-CM

## 2014-08-02 ENCOUNTER — Ambulatory Visit (INDEPENDENT_AMBULATORY_CARE_PROVIDER_SITE_OTHER): Payer: Medicare Other

## 2014-08-02 DIAGNOSIS — J309 Allergic rhinitis, unspecified: Secondary | ICD-10-CM

## 2014-08-09 ENCOUNTER — Ambulatory Visit (INDEPENDENT_AMBULATORY_CARE_PROVIDER_SITE_OTHER): Payer: Medicare Other

## 2014-08-09 DIAGNOSIS — J309 Allergic rhinitis, unspecified: Secondary | ICD-10-CM

## 2014-08-16 ENCOUNTER — Ambulatory Visit (INDEPENDENT_AMBULATORY_CARE_PROVIDER_SITE_OTHER): Payer: Medicare Other

## 2014-08-16 DIAGNOSIS — J309 Allergic rhinitis, unspecified: Secondary | ICD-10-CM

## 2014-08-23 ENCOUNTER — Ambulatory Visit: Payer: Medicare Other

## 2014-08-29 ENCOUNTER — Ambulatory Visit (INDEPENDENT_AMBULATORY_CARE_PROVIDER_SITE_OTHER): Payer: Medicare Other | Admitting: Podiatry

## 2014-08-29 ENCOUNTER — Encounter: Payer: Self-pay | Admitting: Podiatry

## 2014-08-29 VITALS — BP 153/59 | HR 64

## 2014-08-29 DIAGNOSIS — B351 Tinea unguium: Secondary | ICD-10-CM

## 2014-08-29 DIAGNOSIS — M79606 Pain in leg, unspecified: Secondary | ICD-10-CM

## 2014-08-29 NOTE — Patient Instructions (Signed)
Seen for hypertrophic nails. All nails debrided. Return in 3 months or as needed.  

## 2014-08-29 NOTE — Progress Notes (Signed)
Subjective:  81 year old male presents complaining of painful feet from abnormal nails and requesting toe nails trimmed.  No new problems.   Objective:  Thick hypertrophic nails x 10.  HAV with bunion bilateral. MMN x 10.  Pedal pulses are faintly palpable in DP, not palpable in PT bilateral.   Assessment: Onychomycosis x 10.  Hallux valgus with bunion bilateral, asymptomatic.   Plan: Debrided all nails x 10.  Return in 3 months or as needed

## 2014-08-30 ENCOUNTER — Ambulatory Visit (INDEPENDENT_AMBULATORY_CARE_PROVIDER_SITE_OTHER): Payer: Medicare Other

## 2014-08-30 DIAGNOSIS — J309 Allergic rhinitis, unspecified: Secondary | ICD-10-CM

## 2014-09-06 ENCOUNTER — Ambulatory Visit (INDEPENDENT_AMBULATORY_CARE_PROVIDER_SITE_OTHER): Payer: Medicare Other

## 2014-09-06 DIAGNOSIS — J309 Allergic rhinitis, unspecified: Secondary | ICD-10-CM

## 2014-09-13 ENCOUNTER — Ambulatory Visit (INDEPENDENT_AMBULATORY_CARE_PROVIDER_SITE_OTHER): Payer: Medicare Other

## 2014-09-13 DIAGNOSIS — J309 Allergic rhinitis, unspecified: Secondary | ICD-10-CM | POA: Diagnosis not present

## 2014-09-20 ENCOUNTER — Ambulatory Visit (INDEPENDENT_AMBULATORY_CARE_PROVIDER_SITE_OTHER): Payer: Medicare Other

## 2014-09-20 DIAGNOSIS — J309 Allergic rhinitis, unspecified: Secondary | ICD-10-CM

## 2014-09-26 ENCOUNTER — Ambulatory Visit (INDEPENDENT_AMBULATORY_CARE_PROVIDER_SITE_OTHER): Payer: Medicare Other

## 2014-09-26 DIAGNOSIS — J309 Allergic rhinitis, unspecified: Secondary | ICD-10-CM | POA: Diagnosis not present

## 2014-09-27 ENCOUNTER — Ambulatory Visit (INDEPENDENT_AMBULATORY_CARE_PROVIDER_SITE_OTHER): Payer: Medicare Other

## 2014-09-27 DIAGNOSIS — J309 Allergic rhinitis, unspecified: Secondary | ICD-10-CM | POA: Diagnosis not present

## 2014-10-04 ENCOUNTER — Ambulatory Visit: Payer: Medicare Other

## 2014-10-12 ENCOUNTER — Ambulatory Visit (INDEPENDENT_AMBULATORY_CARE_PROVIDER_SITE_OTHER): Payer: Medicare Other

## 2014-10-12 DIAGNOSIS — J309 Allergic rhinitis, unspecified: Secondary | ICD-10-CM | POA: Diagnosis not present

## 2014-10-18 ENCOUNTER — Ambulatory Visit (INDEPENDENT_AMBULATORY_CARE_PROVIDER_SITE_OTHER): Payer: Medicare Other

## 2014-10-18 DIAGNOSIS — J309 Allergic rhinitis, unspecified: Secondary | ICD-10-CM

## 2014-10-19 ENCOUNTER — Ambulatory Visit: Payer: Medicare Other

## 2014-10-24 ENCOUNTER — Encounter: Payer: Self-pay | Admitting: Internal Medicine

## 2014-10-25 ENCOUNTER — Ambulatory Visit (INDEPENDENT_AMBULATORY_CARE_PROVIDER_SITE_OTHER): Payer: Medicare Other

## 2014-10-25 DIAGNOSIS — J309 Allergic rhinitis, unspecified: Secondary | ICD-10-CM | POA: Diagnosis not present

## 2014-11-01 ENCOUNTER — Ambulatory Visit (INDEPENDENT_AMBULATORY_CARE_PROVIDER_SITE_OTHER): Payer: Medicare Other

## 2014-11-01 DIAGNOSIS — J309 Allergic rhinitis, unspecified: Secondary | ICD-10-CM

## 2014-11-08 ENCOUNTER — Ambulatory Visit (INDEPENDENT_AMBULATORY_CARE_PROVIDER_SITE_OTHER): Payer: Medicare Other

## 2014-11-08 DIAGNOSIS — J309 Allergic rhinitis, unspecified: Secondary | ICD-10-CM

## 2014-11-15 ENCOUNTER — Ambulatory Visit: Payer: Medicare Other

## 2014-11-21 ENCOUNTER — Ambulatory Visit (INDEPENDENT_AMBULATORY_CARE_PROVIDER_SITE_OTHER): Payer: Medicare Other

## 2014-11-21 DIAGNOSIS — J309 Allergic rhinitis, unspecified: Secondary | ICD-10-CM | POA: Diagnosis not present

## 2014-11-29 ENCOUNTER — Ambulatory Visit (INDEPENDENT_AMBULATORY_CARE_PROVIDER_SITE_OTHER): Payer: Medicare Other | Admitting: Podiatry

## 2014-11-29 ENCOUNTER — Encounter: Payer: Self-pay | Admitting: Podiatry

## 2014-11-29 ENCOUNTER — Ambulatory Visit: Payer: Medicare Other | Admitting: Podiatry

## 2014-11-29 ENCOUNTER — Ambulatory Visit (INDEPENDENT_AMBULATORY_CARE_PROVIDER_SITE_OTHER): Payer: Medicare Other

## 2014-11-29 DIAGNOSIS — M79606 Pain in leg, unspecified: Secondary | ICD-10-CM | POA: Diagnosis not present

## 2014-11-29 DIAGNOSIS — J309 Allergic rhinitis, unspecified: Secondary | ICD-10-CM | POA: Diagnosis not present

## 2014-11-29 DIAGNOSIS — B351 Tinea unguium: Secondary | ICD-10-CM | POA: Diagnosis not present

## 2014-11-29 NOTE — Progress Notes (Signed)
Subjective:  81 year old male presents complaining of painful feet from abnormal nails and requesting toe nails trimmed.  No new problems.   Objective:  Thick hypertrophic nails x 10.  HAV with bunion bilateral. MMN x 10.  Pedal pulses are faintly palpable in DP, not palpable in PT bilateral.   Assessment: Onychomycosis x 10.  Hallux valgus with bunion bilateral, asymptomatic.   Plan: Debrided all nails x 10.  Return in 3 months or as needed

## 2014-11-29 NOTE — Patient Instructions (Signed)
Seen for hypertrophic nails. All nails debrided. Return in 3 months or as needed.  

## 2014-12-05 ENCOUNTER — Ambulatory Visit (INDEPENDENT_AMBULATORY_CARE_PROVIDER_SITE_OTHER): Payer: Medicare Other

## 2014-12-05 DIAGNOSIS — J309 Allergic rhinitis, unspecified: Secondary | ICD-10-CM

## 2014-12-13 ENCOUNTER — Ambulatory Visit (INDEPENDENT_AMBULATORY_CARE_PROVIDER_SITE_OTHER): Payer: Medicare Other

## 2014-12-13 DIAGNOSIS — J309 Allergic rhinitis, unspecified: Secondary | ICD-10-CM | POA: Diagnosis not present

## 2014-12-19 ENCOUNTER — Ambulatory Visit (INDEPENDENT_AMBULATORY_CARE_PROVIDER_SITE_OTHER): Payer: Medicare Other

## 2014-12-19 DIAGNOSIS — J309 Allergic rhinitis, unspecified: Secondary | ICD-10-CM | POA: Diagnosis not present

## 2014-12-20 ENCOUNTER — Ambulatory Visit: Payer: Medicare Other

## 2014-12-27 ENCOUNTER — Ambulatory Visit (INDEPENDENT_AMBULATORY_CARE_PROVIDER_SITE_OTHER): Payer: Medicare Other

## 2014-12-27 DIAGNOSIS — J309 Allergic rhinitis, unspecified: Secondary | ICD-10-CM | POA: Diagnosis not present

## 2015-01-03 ENCOUNTER — Ambulatory Visit (INDEPENDENT_AMBULATORY_CARE_PROVIDER_SITE_OTHER): Payer: Medicare Other

## 2015-01-03 DIAGNOSIS — J309 Allergic rhinitis, unspecified: Secondary | ICD-10-CM

## 2015-01-10 ENCOUNTER — Ambulatory Visit (INDEPENDENT_AMBULATORY_CARE_PROVIDER_SITE_OTHER): Payer: Medicare Other

## 2015-01-10 DIAGNOSIS — J309 Allergic rhinitis, unspecified: Secondary | ICD-10-CM

## 2015-01-17 ENCOUNTER — Ambulatory Visit (INDEPENDENT_AMBULATORY_CARE_PROVIDER_SITE_OTHER): Payer: Medicare Other

## 2015-01-17 DIAGNOSIS — J309 Allergic rhinitis, unspecified: Secondary | ICD-10-CM

## 2015-01-23 ENCOUNTER — Encounter: Payer: Self-pay | Admitting: Internal Medicine

## 2015-01-24 ENCOUNTER — Ambulatory Visit (INDEPENDENT_AMBULATORY_CARE_PROVIDER_SITE_OTHER): Payer: Medicare Other

## 2015-01-24 DIAGNOSIS — J309 Allergic rhinitis, unspecified: Secondary | ICD-10-CM | POA: Diagnosis not present

## 2015-01-31 ENCOUNTER — Ambulatory Visit (INDEPENDENT_AMBULATORY_CARE_PROVIDER_SITE_OTHER): Payer: Medicare Other

## 2015-01-31 DIAGNOSIS — J309 Allergic rhinitis, unspecified: Secondary | ICD-10-CM | POA: Diagnosis not present

## 2015-02-07 ENCOUNTER — Ambulatory Visit (INDEPENDENT_AMBULATORY_CARE_PROVIDER_SITE_OTHER): Payer: Medicare Other

## 2015-02-07 DIAGNOSIS — J309 Allergic rhinitis, unspecified: Secondary | ICD-10-CM

## 2015-02-08 ENCOUNTER — Telehealth: Payer: Self-pay | Admitting: Internal Medicine

## 2015-02-08 NOTE — Telephone Encounter (Signed)
Date Mixed: 02/07/15 Vial: 1 Strength: 1:10 Here/Mail/Pick Up: here Mixed By: tbs

## 2015-02-14 ENCOUNTER — Ambulatory Visit (INDEPENDENT_AMBULATORY_CARE_PROVIDER_SITE_OTHER): Payer: Medicare Other

## 2015-02-14 DIAGNOSIS — J309 Allergic rhinitis, unspecified: Secondary | ICD-10-CM | POA: Diagnosis not present

## 2015-02-17 ENCOUNTER — Encounter: Payer: Self-pay | Admitting: Internal Medicine

## 2015-02-21 ENCOUNTER — Ambulatory Visit (INDEPENDENT_AMBULATORY_CARE_PROVIDER_SITE_OTHER): Payer: Medicare Other

## 2015-02-21 DIAGNOSIS — J309 Allergic rhinitis, unspecified: Secondary | ICD-10-CM | POA: Diagnosis not present

## 2015-02-28 ENCOUNTER — Ambulatory Visit (INDEPENDENT_AMBULATORY_CARE_PROVIDER_SITE_OTHER): Payer: Medicare Other

## 2015-02-28 DIAGNOSIS — J309 Allergic rhinitis, unspecified: Secondary | ICD-10-CM

## 2015-03-01 ENCOUNTER — Encounter: Payer: Self-pay | Admitting: Podiatry

## 2015-03-01 ENCOUNTER — Ambulatory Visit (INDEPENDENT_AMBULATORY_CARE_PROVIDER_SITE_OTHER): Payer: Medicare Other | Admitting: Podiatry

## 2015-03-01 VITALS — BP 140/93 | HR 63

## 2015-03-01 DIAGNOSIS — B351 Tinea unguium: Secondary | ICD-10-CM

## 2015-03-01 DIAGNOSIS — M79606 Pain in leg, unspecified: Secondary | ICD-10-CM | POA: Diagnosis not present

## 2015-03-01 NOTE — Patient Instructions (Signed)
Seen for hypertrophic nails. All nails debrided. Return in 3 months or as needed.  

## 2015-03-01 NOTE — Progress Notes (Signed)
Subjective:  81 year old male presents complaining of painful feet from abnormal nails and requesting toe nails trimmed.  No new problems.   Objective:  Thick hypertrophic nails x 10.  HAV with bunion bilateral. MMN x 10.  Pedal pulses are faintly palpable in DP, not palpable in PT bilateral.   Assessment: Onychomycosis x 10.  Hallux valgus with bunion bilateral, asymptomatic.   Plan: Debrided all nails x 10.  Return in 3 months or as needed

## 2015-03-07 ENCOUNTER — Ambulatory Visit (INDEPENDENT_AMBULATORY_CARE_PROVIDER_SITE_OTHER): Payer: Medicare Other

## 2015-03-07 DIAGNOSIS — J309 Allergic rhinitis, unspecified: Secondary | ICD-10-CM | POA: Diagnosis not present

## 2015-03-14 ENCOUNTER — Ambulatory Visit (INDEPENDENT_AMBULATORY_CARE_PROVIDER_SITE_OTHER): Payer: Medicare Other

## 2015-03-14 DIAGNOSIS — J309 Allergic rhinitis, unspecified: Secondary | ICD-10-CM

## 2015-03-16 ENCOUNTER — Encounter: Payer: Self-pay | Admitting: Internal Medicine

## 2015-03-16 ENCOUNTER — Ambulatory Visit (INDEPENDENT_AMBULATORY_CARE_PROVIDER_SITE_OTHER): Payer: Medicare Other | Admitting: Internal Medicine

## 2015-03-16 VITALS — BP 140/68 | HR 57 | Ht 66.0 in | Wt 211.0 lb

## 2015-03-16 DIAGNOSIS — J45998 Other asthma: Secondary | ICD-10-CM

## 2015-03-16 DIAGNOSIS — G4733 Obstructive sleep apnea (adult) (pediatric): Secondary | ICD-10-CM

## 2015-03-16 DIAGNOSIS — J301 Allergic rhinitis due to pollen: Secondary | ICD-10-CM

## 2015-03-16 MED ORDER — ALBUTEROL SULFATE HFA 108 (90 BASE) MCG/ACT IN AERS: 2.0000 | INHALATION_SPRAY | Freq: Four times a day (QID) | RESPIRATORY_TRACT | Status: DC | PRN

## 2015-03-16 NOTE — Patient Instructions (Signed)
We can continue allergy vaccine 1:10 [facility]  We can continue BIPAP 12/ 8/ Advanced   Our goal is to use this when ever you sleep, all night every night. If it is uncomfortable, let the people at [facility] know, so they can work with it.  Script printed to hold for an albuterol rescue inhaler to use for wheezing if needed  Don't forget that flu shot, later this fall

## 2015-03-16 NOTE — Progress Notes (Signed)
Subjective:    Patient ID: Christopher Shepherd, male    DOB: 1926/10/05, 81 y.o.   MRN: [id number]  HPI 03/21/11-72--year-old male never smoker followed for cough, asthma, allergic rhinitis, obstructive sleep apnea Last here October 18,2011 With season change he is beginning to wheeze a little at night and throat feels more congested. He denies nasal congestion sneezing or drainage and does not think he has a cold. He is out of inhalers. He continues allergy vaccine and believes that helps him. Dr. Karlton Lemon manages his BiPAP which he continues to use all night every night 20/8, for sleep apnea.  02/21/12- 46--year-old male never smoker followed for cough, asthma, allergic rhinitis, obstructive sleep apnea BiPAP I 20/ E 8 Sleep apnea control good and he is compliant with BiPAP. Working on his weight. These Still on vaccine and doing well with it; cough-dry; using Nasonex as well Describes 2 weeks of dry cough and throat tickle blamed on the season change. Mild head congestion and postnasal drip. Clear mucus. No fever or sore throat. Qvar inhaler was too expensive. Admits occasional wheeze.  02/01/13- 63--year-old male never smoker followed for cough, asthma, allergic rhinitis, obstructive sleep apnea BiPAP I 20/ E 8 Acute Visit.  C/o dry cough, wheezing, chest tightness, nasal congestion, and PND x 1 wk. Asking for neb and depo. "Same thing around this time every year" No fever or purulent.  03/15/14-  75--year-old male never smoker followed for cough, asthma, allergic rhinitis, obstructive sleep apnea FOLLOWS FOR: Pt states his allergy vaccine is going well, no complaints. Pt states his breathing is unchanged since last OV. Pt denies SOB, cough and CP/tightness. 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 Allergy vaccine 1:10 [place]  07/14/14- 5--year-old male never smoker followed for cough, asthma, allergic rhinitis, obstructive sleep apnea  Follows For: Prod  cough ( yellow), wheezing, some chest congestion - Denies sob  He feels he needs nebulizer treatment and Depo-Medrol based on past experience. Allergy vaccine still seems like a good idea to him and he is satisfied to continue. Using BiPAP 12/8 "periodically"/Advanced, managed by his primary physician. I reminded him of the usual goal that it be used all night every night but he can discuss it with his primary physician.  03/16/15- 89--year-old male never smoker followed for cough, asthma, allergic rhinitis, obstructive sleep apnea BIPAP 12 / 8 / Advanced Allergy vaccine 1:10 [facility] Follows For: Patient states he is doing well. He just started with some wheezing a couple of weeks ago. No c/o cough or chest tightness. Pt still recieving allegy injections once a week and  tolerating well.  Pt states he will get flu shot in about another 2 - 3 weeks.  He is satisfied with his allergy vaccine and he is satisfied to continue BiPAP 12/8. I encouraged more regular use of his BiPAP. He needs prescription for a rescue inhaler. CXR 10/11/13-  IMPRESSION: Mild cardiomegaly. No acute findings. Electronically Signed  By: Aletta Edouard M.D.  On: 10/11/2013 22:04  Review of Systems- see HPI Constitutional:   No-   weight loss, night sweats, fevers, chills, fatigue, lassitude. HEENT:   No-  headaches, difficulty swallowing, tooth/dental problems, sore throat,       No-  sneezing, itching, ear ache,    +nasal congestion, +post nasal drip,  CV:  No-   chest pain, orthopnea, PND, swelling in lower extremities, anasarca,  dizziness, palpitations Resp: No-   shortness of breath with exertion  or at rest.             +  productive cough,  + non-productive cough,  No-  coughing up of blood.             +  change in color of mucus.  + wheezing.   Skin: No-   rash or lesions. GI:  No-   heartburn, indigestion, abdominal pain, nausea, vomiting,  GU:  MS:  No-   joint pain or swelling.   Neuro- nothing unusual   Psych:  No- change in mood or affect. No depression or anxiety.  No memory loss.   Objective:   Physical Exam General- Alert, Oriented, Affect-appropriate, Distress- none acute; + obese Skin- rash-none, lesions- none, excoriation- none Lymphadenopathy- none Head- atraumatic            Eyes- Gross vision intact, PERRLA, conjunctivae clear secretions            Ears- Hearing, canals-normal            Nose- + turbinate edema, no-Septal dev, mucus, polyps, erosion, perforation             Throat- Mallampati IV , mucosa dry , drainage- none, tonsils- atrophic Neck- flexible , trachea midline, no stridor , thyroid nl, carotid no bruit Chest - symmetrical excursion , unlabored           Heart/CV- RRR with skipped beats , no murmur , no gallop  , no rub, nl s1 s2                           - JVD- none , edema- none, stasis changes- none, varices- none           Lung- clear, + shallow, wheeze- none, cough-none , dullness-none, rub- none           Chest wall-  Abd-  Br/ Gen/ Rectal- Not done, not indicated Extrem- cyanosis- none, clubbing, none, atrophy- none, strength- nl Neuro- grossly intact to observation Assessment & Plan:

## 2015-03-18 NOTE — Assessment & Plan Note (Signed)
He still wants to continue allergy vaccine. We will continue another year and then probably stop.

## 2015-03-18 NOTE — Assessment & Plan Note (Signed)
Mild intermittent uncomplicated

## 2015-03-18 NOTE — Assessment & Plan Note (Signed)
Discussed goals for utilization and symptom control. Discussed basic sleep hygiene.

## 2015-03-21 ENCOUNTER — Ambulatory Visit (INDEPENDENT_AMBULATORY_CARE_PROVIDER_SITE_OTHER): Payer: Medicare Other

## 2015-03-21 DIAGNOSIS — J309 Allergic rhinitis, unspecified: Secondary | ICD-10-CM | POA: Diagnosis not present

## 2015-03-28 ENCOUNTER — Ambulatory Visit (INDEPENDENT_AMBULATORY_CARE_PROVIDER_SITE_OTHER): Payer: Medicare Other

## 2015-03-28 DIAGNOSIS — J309 Allergic rhinitis, unspecified: Secondary | ICD-10-CM | POA: Diagnosis not present

## 2015-04-04 ENCOUNTER — Ambulatory Visit (INDEPENDENT_AMBULATORY_CARE_PROVIDER_SITE_OTHER): Payer: Medicare Other

## 2015-04-04 DIAGNOSIS — J309 Allergic rhinitis, unspecified: Secondary | ICD-10-CM | POA: Diagnosis not present

## 2015-04-11 ENCOUNTER — Ambulatory Visit (INDEPENDENT_AMBULATORY_CARE_PROVIDER_SITE_OTHER): Payer: Medicare Other

## 2015-04-11 DIAGNOSIS — J309 Allergic rhinitis, unspecified: Secondary | ICD-10-CM

## 2015-04-18 ENCOUNTER — Ambulatory Visit (INDEPENDENT_AMBULATORY_CARE_PROVIDER_SITE_OTHER): Payer: Medicare Other

## 2015-04-18 DIAGNOSIS — J309 Allergic rhinitis, unspecified: Secondary | ICD-10-CM

## 2015-04-18 DIAGNOSIS — Z23 Encounter for immunization: Secondary | ICD-10-CM

## 2015-04-25 ENCOUNTER — Ambulatory Visit (INDEPENDENT_AMBULATORY_CARE_PROVIDER_SITE_OTHER): Payer: Medicare Other

## 2015-04-25 DIAGNOSIS — J309 Allergic rhinitis, unspecified: Secondary | ICD-10-CM

## 2015-05-02 ENCOUNTER — Ambulatory Visit (INDEPENDENT_AMBULATORY_CARE_PROVIDER_SITE_OTHER): Payer: Medicare Other

## 2015-05-02 DIAGNOSIS — J309 Allergic rhinitis, unspecified: Secondary | ICD-10-CM | POA: Diagnosis not present

## 2015-05-09 ENCOUNTER — Ambulatory Visit (INDEPENDENT_AMBULATORY_CARE_PROVIDER_SITE_OTHER): Payer: Medicare Other

## 2015-05-09 DIAGNOSIS — J309 Allergic rhinitis, unspecified: Secondary | ICD-10-CM

## 2015-05-16 ENCOUNTER — Ambulatory Visit (INDEPENDENT_AMBULATORY_CARE_PROVIDER_SITE_OTHER): Payer: Medicare Other

## 2015-05-16 DIAGNOSIS — J309 Allergic rhinitis, unspecified: Secondary | ICD-10-CM

## 2015-05-23 ENCOUNTER — Ambulatory Visit (INDEPENDENT_AMBULATORY_CARE_PROVIDER_SITE_OTHER): Payer: Medicare Other

## 2015-05-23 DIAGNOSIS — J309 Allergic rhinitis, unspecified: Secondary | ICD-10-CM | POA: Diagnosis not present

## 2015-05-30 ENCOUNTER — Ambulatory Visit (INDEPENDENT_AMBULATORY_CARE_PROVIDER_SITE_OTHER): Payer: Medicare Other

## 2015-05-30 DIAGNOSIS — J309 Allergic rhinitis, unspecified: Secondary | ICD-10-CM

## 2015-05-31 ENCOUNTER — Ambulatory Visit (INDEPENDENT_AMBULATORY_CARE_PROVIDER_SITE_OTHER): Payer: Medicare Other | Admitting: Podiatry

## 2015-05-31 ENCOUNTER — Encounter: Payer: Self-pay | Admitting: Podiatry

## 2015-05-31 VITALS — BP 160/86 | HR 68

## 2015-05-31 DIAGNOSIS — M79606 Pain in leg, unspecified: Secondary | ICD-10-CM

## 2015-05-31 DIAGNOSIS — B351 Tinea unguium: Secondary | ICD-10-CM | POA: Diagnosis not present

## 2015-05-31 NOTE — Progress Notes (Signed)
Subjective:  81 year old male presents complaining of painful nails and requesting toe nails trimmed.  No new problems.   Objective:  Thick hypertrophic nails x 10.  HAV with bunion bilateral. MMN x 10.  Pedal pulses are faintly palpable in DP, not palpable in PT bilateral.   Assessment: Onychomycosis x 10.  Hallux valgus with bunion bilateral, asymptomatic.   Plan: Debrided all nails x 10.  Return in 3 months or as needed

## 2015-05-31 NOTE — Patient Instructions (Signed)
Seen for hypertrophic nails. All nails debrided. Return in 3 months or as needed.  

## 2015-06-06 ENCOUNTER — Ambulatory Visit: Payer: Medicare Other

## 2015-06-13 ENCOUNTER — Ambulatory Visit (INDEPENDENT_AMBULATORY_CARE_PROVIDER_SITE_OTHER): Payer: Medicare Other

## 2015-06-13 DIAGNOSIS — J309 Allergic rhinitis, unspecified: Secondary | ICD-10-CM | POA: Diagnosis not present

## 2015-06-14 ENCOUNTER — Ambulatory Visit (INDEPENDENT_AMBULATORY_CARE_PROVIDER_SITE_OTHER): Payer: Medicare Other

## 2015-06-14 ENCOUNTER — Telehealth: Payer: Self-pay | Admitting: Internal Medicine

## 2015-06-14 DIAGNOSIS — J309 Allergic rhinitis, unspecified: Secondary | ICD-10-CM | POA: Diagnosis not present

## 2015-06-14 NOTE — Telephone Encounter (Signed)
Allergy Serum Extract Date Mixed: 06/14/15 Vial: 1 Strength: 1:10 Here/Mail/Pick Up: here Mixed By: tbs Last OV: 03/15/14 Pending OV: 03/16/15

## 2015-06-20 ENCOUNTER — Ambulatory Visit (INDEPENDENT_AMBULATORY_CARE_PROVIDER_SITE_OTHER): Payer: Medicare Other

## 2015-06-20 DIAGNOSIS — J309 Allergic rhinitis, unspecified: Secondary | ICD-10-CM

## 2015-06-27 ENCOUNTER — Ambulatory Visit: Payer: Medicare Other

## 2015-07-11 ENCOUNTER — Ambulatory Visit: Payer: Medicare Other

## 2015-07-18 ENCOUNTER — Ambulatory Visit (INDEPENDENT_AMBULATORY_CARE_PROVIDER_SITE_OTHER): Payer: Medicare Other

## 2015-07-18 DIAGNOSIS — J309 Allergic rhinitis, unspecified: Secondary | ICD-10-CM

## 2015-07-25 ENCOUNTER — Ambulatory Visit (INDEPENDENT_AMBULATORY_CARE_PROVIDER_SITE_OTHER): Payer: Medicare Other

## 2015-07-25 DIAGNOSIS — J309 Allergic rhinitis, unspecified: Secondary | ICD-10-CM

## 2015-08-08 ENCOUNTER — Ambulatory Visit (INDEPENDENT_AMBULATORY_CARE_PROVIDER_SITE_OTHER): Payer: Medicare Other

## 2015-08-08 DIAGNOSIS — J309 Allergic rhinitis, unspecified: Secondary | ICD-10-CM | POA: Diagnosis not present

## 2015-08-22 ENCOUNTER — Ambulatory Visit: Payer: Medicare Other

## 2015-08-29 ENCOUNTER — Ambulatory Visit: Payer: Medicare Other | Admitting: Podiatry

## 2015-09-07 ENCOUNTER — Ambulatory Visit (INDEPENDENT_AMBULATORY_CARE_PROVIDER_SITE_OTHER): Payer: Medicare Other | Admitting: Podiatry

## 2015-09-07 ENCOUNTER — Encounter: Payer: Self-pay | Admitting: Podiatry

## 2015-09-07 VITALS — BP 152/58 | HR 72

## 2015-09-07 DIAGNOSIS — B351 Tinea unguium: Secondary | ICD-10-CM

## 2015-09-07 DIAGNOSIS — M79606 Pain in leg, unspecified: Secondary | ICD-10-CM

## 2015-09-07 NOTE — Patient Instructions (Signed)
Seen for hypertrophic nails. All nails debrided. Return in 3 months or as needed.  

## 2015-09-07 NOTE — Progress Notes (Signed)
Subjective:  82 year old male presents complaining of painful nails and requesting toe nails trimmed.  No new problems.   Objective:  Thick hypertrophic nails x 10.  HAV with bunion bilateral. MMN x 10.  Pedal pulses are faintly palpable in DP, not palpable in PT bilateral.   Assessment: Onychomycosis x 10.  Hallux valgus with bunion bilateral, asymptomatic.   Plan: Debrided all nails x 10.

## 2015-11-16 ENCOUNTER — Encounter: Payer: Self-pay | Admitting: Internal Medicine

## 2015-12-07 ENCOUNTER — Encounter: Payer: Self-pay | Admitting: Podiatry

## 2015-12-07 ENCOUNTER — Ambulatory Visit (INDEPENDENT_AMBULATORY_CARE_PROVIDER_SITE_OTHER): Payer: Medicare Other | Admitting: Podiatry

## 2015-12-07 VITALS — BP 157/60

## 2015-12-07 DIAGNOSIS — B351 Tinea unguium: Secondary | ICD-10-CM | POA: Diagnosis not present

## 2015-12-07 DIAGNOSIS — M79606 Pain in leg, unspecified: Secondary | ICD-10-CM

## 2015-12-07 NOTE — Patient Instructions (Signed)
Seen for hypertrophic nails. All nails debrided. Return in 3 months or as needed.  

## 2015-12-07 NOTE — Progress Notes (Signed)
Subjective:  82 year old male presents complaining of painful nails and requesting toe nails trimmed.   Objective:  Thick hypertrophic nails x 10.  HAV with bunion bilateral. MMN x 10.  Pedal pulses are faintly palpable in DP, not palpable in PT bilateral.   Assessment: Onychomycosis x 10.  Hallux valgus with bunion bilateral, asymptomatic.   Plan: Debrided all nails x 10.

## 2016-01-24 ENCOUNTER — Other Ambulatory Visit: Payer: Self-pay | Admitting: Internal Medicine

## 2016-01-24 ENCOUNTER — Ambulatory Visit
Admission: RE | Admit: 2016-01-24 | Discharge: 2016-01-24 | Disposition: A | Discharge status: Home / Self Care | Payer: Medicare Other | Source: Ambulatory Visit | Attending: Internal Medicine | Admitting: Internal Medicine

## 2016-01-24 DIAGNOSIS — R05 Cough: Secondary | ICD-10-CM

## 2016-01-24 DIAGNOSIS — R059 Cough, unspecified: Secondary | ICD-10-CM

## 2016-01-29 ENCOUNTER — Telehealth: Payer: Self-pay | Admitting: Internal Medicine

## 2016-01-29 NOTE — Telephone Encounter (Signed)
Consider NP if available

## 2016-01-29 NOTE — Telephone Encounter (Signed)
Spoke with the pt  He is c/o non prod cough, esp worse at night for the past 2 wks  OTC cough meds not helping  He states no increased SOB, but has noticed minimal wheezing "the nasal spray takes care of that" No f/c/s, CP, chest tightness or other co's Please advise thanks!  No Known Allergies Current Outpatient Prescriptions on File Prior to Visit  Medication Sig Dispense Refill  . albuterol (PROVENTIL HFA;VENTOLIN HFA) 108 (90 BASE) MCG/ACT inhaler Inhale 2 puffs into the lungs every 6 (six) hours as needed for wheezing or shortness of breath. 1 Inhaler 6  . atorvastatin (LIPITOR) 80 MG tablet Take 80 mg by mouth daily at 6 PM.     . hydrochlorothiazide (HYDRODIURIL) 25 MG tablet Take 25 mg by mouth daily.     Marland Kitchen ibuprofen (ADVIL,MOTRIN) 600 MG tablet Take 600 mg by mouth daily.    Marland Kitchen losartan (COZAAR) 100 MG tablet Take 100 mg by mouth every morning.    . mometasone (NASONEX) 50 MCG/ACT nasal spray Place 2 sprays into the nose as needed.     . NONFORMULARY OR COMPOUNDED ITEM Allergy Vaccine 1:10 Given at [facility]    . Omega 3 1000 MG CAPS Take 1 capsule by mouth.     No current facility-administered medications on file prior to visit.

## 2016-01-29 NOTE — Telephone Encounter (Signed)
Spoke with pt. He has been scheduled with TP tomorrow at 2:15pm. Nothing further was needed.

## 2016-01-30 ENCOUNTER — Ambulatory Visit (INDEPENDENT_AMBULATORY_CARE_PROVIDER_SITE_OTHER): Payer: Medicare Other | Admitting: Adult Health

## 2016-01-30 ENCOUNTER — Encounter: Payer: Self-pay | Admitting: Adult Health

## 2016-01-30 DIAGNOSIS — J45998 Other asthma: Secondary | ICD-10-CM

## 2016-01-30 MED ORDER — LEVALBUTEROL HCL 0.63 MG/3ML IN NEBU
0.6300 mg | INHALATION_SOLUTION | Freq: Once | RESPIRATORY_TRACT | Status: AC
  Administered 2016-01-30: 0.63 mg via RESPIRATORY_TRACT

## 2016-01-30 MED ORDER — METHYLPREDNISOLONE ACETATE 80 MG/ML IJ SUSP
80.0000 mg | Freq: Once | INTRAMUSCULAR | Status: AC
  Administered 2016-01-30: 80 mg via INTRAMUSCULAR

## 2016-01-30 NOTE — Assessment & Plan Note (Signed)
Mild flare  Depo shot  xopenex neb.   Plan  Depo medrol injection today in office  Xopenex neb x 1 in office.  Mucinex DM Twice daily  As needed  Cough/congestion  May restart allergy vaccine as able.  Follow up Dr. Annamaria Boots  In 6 months and As needed   Please contact office for sooner follow up if symptoms do not improve or worsen or seek emergency care

## 2016-01-30 NOTE — Addendum Note (Signed)
Addended by: Osa Craver on: 01/30/2016 03:15 PM   Modules accepted: Orders

## 2016-01-30 NOTE — Progress Notes (Signed)
Subjective:    Patient ID: Christopher Shepherd, male    DOB: 08/04/1926, 82 y.o.   MRN: [id number]  HPI 82 year old male never smoker followed for asthma and allergic rhinitis (allergy vaccines) and obstructive sleep apnea on BiPAP  01/30/2016 Acute OV  patient presented for an acute office visit He complains of dry cough x 2 weeks with chest congestion/tightness, occ wheezing.  Denies any sinus pressure/drainage, fever, nasuea or vomiting. No discolored mucus, or fever.  He is on allergy vaccines bimonthly but stopped last couple  due to scheduling conflict.  Wants to restart  Cough is worse at night . No GERD sx . Denies nasal drip or drainage.  Seen by PCP on 01/24/16 , CXR shows no acute process.  Took some otc cough med that helped some but he felt it was too strong.  Requests breathing treatment neb and steroid shot -that is what always helps him per pt.   Remains on BIPAP , only wears on occasion for 1-2 hrs each night.  Discussed compliance .    Past Medical History:  Diagnosis Date  . Allergic rhinitis   . Anemia   . Arthritis    osteoarthritis  . Asthma 1994   with bronchitis  . Cough   . Hypertension   . Obstructive sleep apnea    uses a C-Pap   Current Outpatient Prescriptions on File Prior to Visit  Medication Sig Dispense Refill  . atorvastatin (LIPITOR) 80 MG tablet Take 80 mg by mouth daily at 6 PM.     . hydrochlorothiazide (HYDRODIURIL) 25 MG tablet Take 25 mg by mouth daily.     Marland Kitchen ibuprofen (ADVIL,MOTRIN) 600 MG tablet Take 600 mg by mouth daily.    Marland Kitchen losartan (COZAAR) 100 MG tablet Take 100 mg by mouth every morning.    . mometasone (NASONEX) 50 MCG/ACT nasal spray Place 2 sprays into the nose as needed.     . NONFORMULARY OR COMPOUNDED ITEM Allergy Vaccine 1:10 Given at [facility]    . Omega 3 1000 MG CAPS Take 1 capsule by mouth.    Marland Kitchen albuterol (PROVENTIL HFA;VENTOLIN HFA) 108 (90 BASE) MCG/ACT inhaler Inhale 2 puffs into the lungs every 6 (six)  hours as needed for wheezing or shortness of breath. (Patient not taking: Reported on 01/30/2016) 1 Inhaler 6   No current facility-administered medications on file prior to visit.       Review of Systems    Constitutional:   No  weight loss, night sweats,  Fevers, chills, fatigue, or  lassitude.  HEENT:   No headaches,  Difficulty swallowing,  Tooth/dental problems, or  Sore throat,                No sneezing, itching, ear ache, + nasal congestion, post nasal drip,   CV:  No chest pain,  Orthopnea, PND, swelling in lower extremities, anasarca, dizziness, palpitations, syncope.   GI  No heartburn, indigestion, abdominal pain, nausea, vomiting, diarrhea, change in bowel habits, loss of appetite, bloody stools.   Resp:    No chest wall deformity  Skin: no rash or lesions.  GU: no dysuria, change in color of urine, no urgency or frequency.  No flank pain, no hematuria   MS:  No joint pain or swelling.  No decreased range of motion.  No back pain.  Psych:  No change in mood or affect. No depression or anxiety.  No memory loss.      Objective:   Physical Exam  Vitals:   01/30/16 1425  BP: 138/68  Pulse: 69  Temp: 98.1 F (36.7 C)  TempSrc: Oral  SpO2: 98%  Weight: 206 lb (93.4 kg)  Height: 5\' 8"  (1.727 m)    GEN: A/Ox3; pleasant , NAD, elderly    HEENT:  [place]/AT,  EACs-clear, TMs-wnl, NOSE-clear, THROAT-clear, no lesions, no postnasal drip or exudate noted.   NECK:  Supple w/ fair ROM; no JVD; normal carotid impulses w/o bruits; no thyromegaly or nodules palpated; no lymphadenopathy.    RESP  Clear  P & A; w/o, wheezes/ rales/ or rhonchi. no accessory muscle use, no dullness to percussion  CARD:  RRR, no m/r/g  , no peripheral edema, pulses intact, no cyanosis or clubbing.  GI:   Soft & nt; nml bowel sounds; no organomegaly or masses detected.   Musco: Warm bil, no deformities or joint swelling noted.   Neuro: alert, no focal deficits noted.    Skin: Warm, no  lesions or rashes  Rishika Mccollom NP-C  [facility]  01/30/2016        Assessment & Plan:

## 2016-01-30 NOTE — Patient Instructions (Signed)
Depo medrol injection today in office  Xopenex neb x 1 in office.  Mucinex DM Twice daily  As needed  Cough/congestion  May restart allergy vaccine as able.  Follow up Dr. Annamaria Boots  In 6 months and As needed   Please contact office for sooner follow up if symptoms do not improve or worsen or seek emergency care

## 2016-02-07 ENCOUNTER — Ambulatory Visit (INDEPENDENT_AMBULATORY_CARE_PROVIDER_SITE_OTHER): Payer: Medicare Other | Admitting: *Deleted

## 2016-02-07 DIAGNOSIS — J309 Allergic rhinitis, unspecified: Secondary | ICD-10-CM | POA: Diagnosis not present

## 2016-02-20 ENCOUNTER — Ambulatory Visit (INDEPENDENT_AMBULATORY_CARE_PROVIDER_SITE_OTHER): Payer: Medicare Other

## 2016-02-20 DIAGNOSIS — J309 Allergic rhinitis, unspecified: Secondary | ICD-10-CM

## 2016-03-05 ENCOUNTER — Ambulatory Visit (INDEPENDENT_AMBULATORY_CARE_PROVIDER_SITE_OTHER): Payer: Medicare Other | Admitting: *Deleted

## 2016-03-05 DIAGNOSIS — J309 Allergic rhinitis, unspecified: Secondary | ICD-10-CM | POA: Diagnosis not present

## 2016-03-12 ENCOUNTER — Ambulatory Visit (INDEPENDENT_AMBULATORY_CARE_PROVIDER_SITE_OTHER): Payer: Medicare Other

## 2016-03-12 ENCOUNTER — Ambulatory Visit: Payer: Medicare Other | Admitting: Podiatry

## 2016-03-12 DIAGNOSIS — J309 Allergic rhinitis, unspecified: Secondary | ICD-10-CM | POA: Diagnosis not present

## 2016-03-19 ENCOUNTER — Ambulatory Visit (INDEPENDENT_AMBULATORY_CARE_PROVIDER_SITE_OTHER): Payer: Medicare Other | Admitting: Podiatry

## 2016-03-19 ENCOUNTER — Encounter: Payer: Self-pay | Admitting: Podiatry

## 2016-03-19 DIAGNOSIS — M79673 Pain in unspecified foot: Secondary | ICD-10-CM

## 2016-03-19 DIAGNOSIS — M79606 Pain in leg, unspecified: Secondary | ICD-10-CM

## 2016-03-19 DIAGNOSIS — B351 Tinea unguium: Secondary | ICD-10-CM

## 2016-03-19 NOTE — Progress Notes (Signed)
Subjective:  82 year old male presents complaining of painful nails and requesting toe nails trimmed.  No new problems.   Objective:  Thick hypertrophic nails x 10.  HAV with bunion bilateral. MMN x 10.  Pedal pulses are faintly palpable in DP, not palpable in PT bilateral.  No abnormal skin lesions noted.  Assessment: Onychomycosis x 10.  Hallux valgus with bunion bilateral, asymptomatic.   Plan: Debrided all nails x 10.  Return in 3 month for routine foot care.

## 2016-03-19 NOTE — Patient Instructions (Signed)
Seen for hypertrophic nails. All nails debrided. Return in 3 months or as needed.  

## 2016-03-26 ENCOUNTER — Ambulatory Visit (INDEPENDENT_AMBULATORY_CARE_PROVIDER_SITE_OTHER): Payer: Medicare Other

## 2016-03-26 DIAGNOSIS — J309 Allergic rhinitis, unspecified: Secondary | ICD-10-CM | POA: Diagnosis not present

## 2016-04-02 ENCOUNTER — Ambulatory Visit: Payer: Medicare Other

## 2016-04-09 ENCOUNTER — Ambulatory Visit (INDEPENDENT_AMBULATORY_CARE_PROVIDER_SITE_OTHER): Payer: Medicare Other

## 2016-04-09 ENCOUNTER — Ambulatory Visit (INDEPENDENT_AMBULATORY_CARE_PROVIDER_SITE_OTHER): Payer: Medicare Other | Admitting: *Deleted

## 2016-04-09 DIAGNOSIS — Z23 Encounter for immunization: Secondary | ICD-10-CM | POA: Diagnosis not present

## 2016-04-09 DIAGNOSIS — J309 Allergic rhinitis, unspecified: Secondary | ICD-10-CM

## 2016-04-23 ENCOUNTER — Ambulatory Visit (INDEPENDENT_AMBULATORY_CARE_PROVIDER_SITE_OTHER): Payer: Medicare Other | Admitting: *Deleted

## 2016-04-23 DIAGNOSIS — J309 Allergic rhinitis, unspecified: Secondary | ICD-10-CM

## 2016-04-30 ENCOUNTER — Ambulatory Visit: Payer: Medicare Other

## 2016-05-07 ENCOUNTER — Ambulatory Visit (INDEPENDENT_AMBULATORY_CARE_PROVIDER_SITE_OTHER): Payer: Medicare Other

## 2016-05-07 DIAGNOSIS — J309 Allergic rhinitis, unspecified: Secondary | ICD-10-CM

## 2016-05-21 ENCOUNTER — Ambulatory Visit (INDEPENDENT_AMBULATORY_CARE_PROVIDER_SITE_OTHER): Payer: Medicare Other

## 2016-05-21 DIAGNOSIS — J309 Allergic rhinitis, unspecified: Secondary | ICD-10-CM

## 2016-06-04 ENCOUNTER — Ambulatory Visit (INDEPENDENT_AMBULATORY_CARE_PROVIDER_SITE_OTHER): Payer: Medicare Other

## 2016-06-04 DIAGNOSIS — J309 Allergic rhinitis, unspecified: Secondary | ICD-10-CM | POA: Diagnosis not present

## 2016-06-13 ENCOUNTER — Telehealth: Payer: Self-pay | Admitting: Internal Medicine

## 2016-06-13 NOTE — Telephone Encounter (Signed)
Pt is fine to be seen; he is seen for other dx's besides allergic rhinitis. Thanks for checking. Okay to keep OV as scheduled.

## 2016-06-18 ENCOUNTER — Ambulatory Visit: Payer: Medicare Other | Admitting: Podiatry

## 2016-07-30 ENCOUNTER — Ambulatory Visit (INDEPENDENT_AMBULATORY_CARE_PROVIDER_SITE_OTHER): Payer: Medicare Other | Admitting: Podiatry

## 2016-07-30 ENCOUNTER — Encounter: Payer: Self-pay | Admitting: Podiatry

## 2016-07-30 VITALS — BP 152/64 | HR 72

## 2016-07-30 DIAGNOSIS — M79606 Pain in leg, unspecified: Secondary | ICD-10-CM

## 2016-07-30 DIAGNOSIS — M79671 Pain in right foot: Secondary | ICD-10-CM

## 2016-07-30 DIAGNOSIS — B351 Tinea unguium: Secondary | ICD-10-CM | POA: Diagnosis not present

## 2016-07-30 DIAGNOSIS — M79672 Pain in left foot: Secondary | ICD-10-CM | POA: Diagnosis not present

## 2016-07-30 NOTE — Patient Instructions (Signed)
Seen for hypertrophic nails. All nails debrided. Return in 3 months or as needed.  

## 2016-07-30 NOTE — Progress Notes (Signed)
Subjective:  83 year old male presents complaining of painful nails and requesting toe nails trimmed.  No new problems.   Objective:  Thick hypertrophic nails x 10.  HAV with bunion bilateral. MMN x 10.  Pedal pulses are faintly palpable in DP, not palpable in PT bilateral.  No abnormal skin lesions noted.  Assessment: Onychomycosis x 10.  Hallux valgus with bunion bilateral, asymptomatic.   Plan: Debrided all nails x 10.  Return in 3 month for routine foot care.

## 2016-08-06 ENCOUNTER — Ambulatory Visit: Payer: Medicare Other | Admitting: Internal Medicine

## 2016-08-12 ENCOUNTER — Ambulatory Visit (INDEPENDENT_AMBULATORY_CARE_PROVIDER_SITE_OTHER): Payer: Medicare Other | Admitting: Internal Medicine

## 2016-08-12 ENCOUNTER — Encounter: Payer: Self-pay | Admitting: Internal Medicine

## 2016-08-12 VITALS — BP 126/82 | HR 80 | Ht 68.0 in | Wt 211.6 lb

## 2016-08-12 DIAGNOSIS — J301 Allergic rhinitis due to pollen: Secondary | ICD-10-CM

## 2016-08-12 DIAGNOSIS — G4733 Obstructive sleep apnea (adult) (pediatric): Secondary | ICD-10-CM | POA: Diagnosis not present

## 2016-08-12 DIAGNOSIS — J45998 Other asthma: Secondary | ICD-10-CM

## 2016-08-12 MED ORDER — MOMETASONE FUROATE 50 MCG/ACT NA SUSP: NASAL | 12 refills | Status: DC

## 2016-08-12 NOTE — Progress Notes (Signed)
Subjective:    Patient ID: Christopher Shepherd, male    DOB: 1927/03/10, 83 y.o.   MRN: [id number]  HPI male never smoker followed for cough, asthma, allergic rhinitis, obstructive sleep apnea  ---------------------------------------------------------------------------------------------  03/16/15- 21--year-old male never smoker followed for cough, asthma, allergic rhinitis, obstructive sleep apnea BIPAP 12 / 8 / [facility] All[facility] vaccine 1:10 [facility] Follows For: Patient states he is doing well. He just started with some wheezing a couple of weeks ago. No c/o cough or chest tightness. Pt still recieving allegy injections once a week and  tolerating well.  Pt states he will get flu shot in about another 2 - 3 weeks.  He is satisfied with his allergy vaccine and he is satisfied to continue BiPAP 12/8. I encouraged more regular use of his BiPAP. He needs prescription for a rescue inhaler. CXR 10/11/13-  IMPRESSION: Mild cardiomegaly. No acute findings. Electronically Signed  By: Aletta Edouard M.D.  On: 10/11/2013 22:04  08/12/2016-83 year old male never smoker followed for cough, asthma, allergic rhinitis, OSA BiPAP 12/8/[facility] Vaccine ended when our allergy vaccine clinic program closed here December, 2017 1 year f/u for asthma and OSA. Breathing has been ok since last visit.  He isn't sure if he still needs allergy vaccine and I suggested he try off of it rather than assuming at his age that he needs to continue. He continues Nasonex with some seasonal variation, no epistaxis. Doing very well with BiPAP at current pressures. He feels comfortable with this and says he sleeps well. No wheeze in a very long time but he wants to keep a rescue inhaler available. CXR 01/24/16 IMPRESSION: Borderline hyperinflation consistent with known reactive airway disease. No evidence of pneumonia nor CHF. Aortic atherosclerosis.   Review of Systems- see HPI Constitutional:   No-   weight loss,  night sweats, fevers, chills, fatigue, lassitude. HEENT:   No-  headaches, difficulty swallowing, tooth/dental problems, sore throat,       No-  sneezing, itching, ear ache,    nasal congestion, +post nasal drip,  CV:  No-   chest pain, orthopnea, PND, swelling in lower extremities, anasarca,  dizziness, palpitations Resp: No-   shortness of breath with exertion or at rest.               productive cough,  + non-productive cough,  No-  coughing up of blood.               change in color of mucus.   wheezing.   Skin: No-   rash or lesions. GI:  No-   heartburn, indigestion, abdominal pain, nausea, vomiting,  GU:  MS:  No-   joint pain or swelling.   Neuro- nothing unusual  Psych:  No- change in mood or affect. No depression or anxiety.  No memory loss.   Objective:   Physical Exam General- Alert, Oriented, Affect-appropriate, Distress- none acute; + Overweight Skin- rash-none, lesions- none, excoriation- none Lymphadenopathy- none Head- atraumatic            Eyes- Gross vision intact, PERRLA, conjunctivae clear secretions            Ears- Hearing, canals-normal            Nose- + turbinate edema, no-Septal dev, mucus, polyps, erosion, perforation             Throat- Mallampati IV , mucosa dry , drainage- none, tonsils- atrophic Neck- flexible , trachea midline, no stridor , thyroid nl, carotid no bruit Chest -  symmetrical excursion , unlabored           Heart/CV- RRR with skipped beats , no murmur , no gallop  , no rub, nl s1 s2                           - JVD- none , edema- none, stasis changes- none, varices- none           Lung- clear, wheeze- none, cough-none , dullness-none, rub- none           Chest wall-  Abd-  Br/ Gen/ Rectal- Not done, not indicated Extrem- cyanosis- none, clubbing, none, atrophy- none, strength- nl Neuro- grossly intact to observation Assessment & Plan:

## 2016-08-12 NOTE — Patient Instructions (Signed)
Script printed for nasonex nasal spray to use for allergic nose problems while needed, such as pollen seasons  You can add an otc antihistamine like claritin if needed.  You have not been having asthma and have not needed asthma inhalers so we are not refilling those now.  Ok [facility]- continue BIPAP 12/ 8. Mask of choic e, humidifier, supplies, AirView    Dx OSA  Please call as needed

## 2016-08-13 NOTE — Assessment & Plan Note (Signed)
Mild intermittent asthma, much improved from past years Not needing maintenance inhaler but we will refill albuterol HFA to keep it available

## 2016-08-13 NOTE — Assessment & Plan Note (Signed)
No download yet available for this visit. He describes good compliance and control and says he is comfortable with these pressures with no snoring and little sleep disturbance.

## 2016-08-13 NOTE — Assessment & Plan Note (Signed)
At his age I favor residual complaints of drainage and stuffiness be nonallergic, vasomotor and irritant related. I encouraged him to see how he does over the next few months using Nasonex and Claritin if needed before assuming that he needs to see an allergist for reevaluation.

## 2016-10-24 ENCOUNTER — Ambulatory Visit: Payer: Medicare Other | Admitting: Podiatry

## 2016-10-30 ENCOUNTER — Emergency Department (HOSPITAL_COMMUNITY)
Admission: EM | Admit: 2016-10-30 | Discharge: 2016-10-30 | Disposition: A | Discharge status: Home / Self Care | Payer: Medicare Other | Attending: Emergency Medicine | Admitting: Emergency Medicine

## 2016-10-30 ENCOUNTER — Emergency Department (HOSPITAL_BASED_OUTPATIENT_CLINIC_OR_DEPARTMENT_OTHER)
Admit: 2016-10-30 | Discharge: 2016-10-30 | Disposition: A | Discharge status: Home / Self Care | Payer: Medicare Other | Attending: Emergency Medicine | Admitting: Emergency Medicine

## 2016-10-30 DIAGNOSIS — S86911A Strain of unspecified muscle(s) and tendon(s) at lower leg level, right leg, initial encounter: Secondary | ICD-10-CM | POA: Diagnosis not present

## 2016-10-30 DIAGNOSIS — Y999 Unspecified external cause status: Secondary | ICD-10-CM | POA: Diagnosis not present

## 2016-10-30 DIAGNOSIS — Z96651 Presence of right artificial knee joint: Secondary | ICD-10-CM | POA: Diagnosis not present

## 2016-10-30 DIAGNOSIS — Y929 Unspecified place or not applicable: Secondary | ICD-10-CM | POA: Insufficient documentation

## 2016-10-30 DIAGNOSIS — M79609 Pain in unspecified limb: Secondary | ICD-10-CM

## 2016-10-30 DIAGNOSIS — S8991XA Unspecified injury of right lower leg, initial encounter: Secondary | ICD-10-CM | POA: Diagnosis present

## 2016-10-30 DIAGNOSIS — Z87891 Personal history of nicotine dependence: Secondary | ICD-10-CM | POA: Insufficient documentation

## 2016-10-30 DIAGNOSIS — Z79899 Other long term (current) drug therapy: Secondary | ICD-10-CM | POA: Diagnosis not present

## 2016-10-30 DIAGNOSIS — I10 Essential (primary) hypertension: Secondary | ICD-10-CM | POA: Diagnosis not present

## 2016-10-30 DIAGNOSIS — Y939 Activity, unspecified: Secondary | ICD-10-CM | POA: Insufficient documentation

## 2016-10-30 DIAGNOSIS — M79604 Pain in right leg: Secondary | ICD-10-CM

## 2016-10-30 DIAGNOSIS — J45909 Unspecified asthma, uncomplicated: Secondary | ICD-10-CM | POA: Diagnosis not present

## 2016-10-30 DIAGNOSIS — S86811A Strain of other muscle(s) and tendon(s) at lower leg level, right leg, initial encounter: Secondary | ICD-10-CM

## 2016-10-30 DIAGNOSIS — X58XXXA Exposure to other specified factors, initial encounter: Secondary | ICD-10-CM | POA: Diagnosis not present

## 2016-10-30 LAB — CBC WITH DIFFERENTIAL/PLATELET
BASOS ABS: 0 10*3/uL (ref 0.0–0.1)
Basophils Relative: 0 %
Eosinophils Absolute: 0.1 10*3/uL (ref 0.0–0.7)
Eosinophils Relative: 2 %
HEMATOCRIT: 36.4 % — AB (ref 39.0–52.0)
Hemoglobin: 12 g/dL — ABNORMAL LOW (ref 13.0–17.0)
LYMPHS PCT: 25 %
Lymphs Abs: 1.6 10*3/uL (ref 0.7–4.0)
MCH: 28.4 pg (ref 26.0–34.0)
MCHC: 33 g/dL (ref 30.0–36.0)
MCV: 86.1 fL (ref 78.0–100.0)
Monocytes Absolute: 0.4 10*3/uL (ref 0.1–1.0)
Monocytes Relative: 6 %
NEUTROS ABS: 4.2 10*3/uL (ref 1.7–7.7)
Neutrophils Relative %: 67 %
Platelets: 162 10*3/uL (ref 150–400)
RBC: 4.23 MIL/uL (ref 4.22–5.81)
RDW: 14 % (ref 11.5–15.5)
WBC: 6.3 10*3/uL (ref 4.0–10.5)

## 2016-10-30 LAB — BASIC METABOLIC PANEL
ANION GAP: 7 (ref 5–15)
BUN: 22 mg/dL — ABNORMAL HIGH (ref 6–20)
CHLORIDE: 105 mmol/L (ref 101–111)
CO2: 28 mmol/L (ref 22–32)
Calcium: 8.6 mg/dL — ABNORMAL LOW (ref 8.9–10.3)
Creatinine, Ser: 1.84 mg/dL — ABNORMAL HIGH (ref 0.61–1.24)
GFR calc Af Amer: 36 mL/min — ABNORMAL LOW (ref >60)
GFR, EST NON AFRICAN AMERICAN: 31 mL/min — AB (ref >60)
GLUCOSE: 91 mg/dL (ref 65–99)
POTASSIUM: 3.9 mmol/L (ref 3.5–5.1)
SODIUM: 140 mmol/L (ref 135–145)

## 2016-10-30 MED ORDER — DICLOFENAC SODIUM 1 % TD GEL: 2.0000 g | Freq: Three times a day (TID) | TRANSDERMAL | 0 refills | Status: DC | PRN

## 2016-10-30 MED ORDER — HYDROCODONE-ACETAMINOPHEN 5-325 MG PO TABS
1.0000 | ORAL_TABLET | Freq: Once | ORAL | Status: AC
  Administered 2016-10-30: 1 via ORAL
  Filled 2016-10-30: qty 1

## 2016-10-30 NOTE — ED Triage Notes (Signed)
Patient c/o right lateral calf pain x 3 weeks. Patient states he has been having bilateral foot swelling. Patient states he has "intense pain" when he puts pressure on his right leg to walk.

## 2016-10-30 NOTE — ED Provider Notes (Signed)
[place] DEPT Provider Note   CSN: [id number] Arrival date & time: 10/30/16  1153     History   Chief Complaint Chief Complaint  Patient presents with  . Foot Swelling  . Leg Pain    HPI Christopher Shepherd is a 83 y.o. male.  The history is provided by the patient. No language interpreter was used.  Leg Pain      Christopher Shepherd is a 83 y.o. male who presents to the Emergency Department complaining of leg pain.  Reports pain for 3 weeks to the right lateral calf. Pain is only present with ambulation. When he rests and then he can range his leg with no pain at all. He denies any injuries. No fevers, chest pain, shortness of breath. He does have some occasional bilateral ankle swelling and this has improved over the last few days. He saw his orthopedic doctor for this and had x-rays performed that showed no disease process. He has been trying Tylenol and ibuprofen at home for the pain with no significant change in symptoms.  Past Medical History:  Diagnosis Date  . Allergic rhinitis   . Anemia   . Arthritis    osteoarthritis  . Asthma 1994   with bronchitis  . Cough   . Hypertension   . Obstructive sleep apnea    uses a C-Pap    Patient Active Problem List   Diagnosis Date Noted  . Chronic asthmatic bronchitis with acute exacerbation ([place]) 07/16/2014  . Pain in lower limb 11/24/2013  . S/P total knee arthroplasty 10/23/2013  . Hyperlipidemia 10/08/2013  . Constipation 10/08/2013  . Essential hypertension, benign 10/08/2013  . Hiccups 10/08/2013  . Acute blood loss anemia 10/06/2013  . OA (osteoarthritis) of knee 10/04/2013  . Onychomycosis 10/30/2012  . Pain in joint, ankle and foot 10/30/2012  . Allergic rhinitis due to pollen 09/05/2010  . Obstructive sleep apnea 04/16/2007  . Allergic-infective asthma 04/16/2007  . COUGH 04/16/2007    Past Surgical History:  Procedure Laterality Date  . COLONOSCOPY W/ POLYPECTOMY    . none    . TOTAL KNEE  ARTHROPLASTY Right 10/04/2013   Procedure: RIGHT TOTAL KNEE ARTHROPLASTY;  Surgeon: Gearlean Alf, MD;  Location: [facility];  Service: Orthopedics;  Laterality: Right;       Home Medications    Prior to Admission medications   Medication Sig Start Date End Date Taking? Authorizing Provider  acetaminophen (TYLENOL 8 HOUR ARTHRITIS PAIN) 650 MG CR tablet Take 650 mg by mouth every 8 (eight) hours as needed for pain.   Yes Historical Provider, MD  atorvastatin (LIPITOR) 80 MG tablet Take 80 mg by mouth daily at 6 PM.    Yes Historical Provider, MD  hydrochlorothiazide (HYDRODIURIL) 25 MG tablet Take 25 mg by mouth daily.    Yes Historical Provider, MD  losartan (COZAAR) 100 MG tablet Take 100 mg by mouth every morning.   Yes Historical Provider, MD  Omega 3 1000 MG CAPS Take 1 capsule by mouth.   Yes Historical Provider, MD  albuterol (PROVENTIL HFA;VENTOLIN HFA) 108 (90 BASE) MCG/ACT inhaler Inhale 2 puffs into the lungs every 6 (six) hours as needed for wheezing or shortness of breath. Patient not taking: Reported on 10/30/2016 03/16/15   Deneise Lever, MD  diclofenac sodium (VOLTAREN) 1 % GEL Apply 2 g topically 3 (three) times daily as needed. 10/30/16   Quintella Reichert, MD  mometasone (NASONEX) 50 MCG/ACT nasal spray 1-2 sprays each nostril daily at bedtime while  needed Patient not taking: Reported on 10/30/2016 08/12/16   Deneise Lever, MD  NONFORMULARY OR COMPOUNDED ITEM Allergy Vaccine 1:10 Given at [facility]    Historical Provider, MD    Family History Family History  Problem Relation Age of Onset  . Cancer Father     cause of death  . Colon cancer Mother     cause of death    Social History Social History  Substance Use Topics  . Smoking status: Former Smoker    Packs/day: 0.20    Years: 15.00    Types: Cigarettes    Quit date: 07/01/1968  . Smokeless tobacco: Former Systems developer     Comment: Quit 50 yrs ago as of 2014  . Alcohol use 1.8 oz/week    3 Cans of beer per week       Comment: occasionally     Allergies   Patient has no known allergies.   Review of Systems Review of Systems  All other systems reviewed and are negative.    Physical Exam Updated Vital Signs BP (!) 172/75 (BP Location: Right Arm)   Pulse (!) 50   Temp 98 F (36.7 C) (Oral)   Resp 17   Ht 5\' 6"  (1.676 m)   Wt 210 lb (95.3 kg)   SpO2 98%   BMI 33.89 kg/m   Physical Exam  Constitutional: He is oriented to person, place, and time. He appears well-developed and well-nourished.  HENT:  Head: Normocephalic and atraumatic.  Cardiovascular: Normal rate and regular rhythm.   No murmur heard. Pulmonary/Chest: Effort normal and breath sounds normal. No respiratory distress.  Abdominal: Soft. There is no tenderness. There is no rebound and no guarding.  Musculoskeletal:  2+ DP pulses bilaterally. Trace pitting edema to bilateral ankles. There is no tenderness to palpation over the right, but there is pain on plantar flexion of the foot against resistance. There is no tenderness over the Achilles, ankle, knee.  Neurological: He is alert and oriented to person, place, and time.  Skin: Skin is warm and dry.  Psychiatric: He has a normal mood and affect. His behavior is normal.  Nursing note and vitals reviewed.    ED Treatments / Results  Labs (all labs ordered are listed, but only abnormal results are displayed) Labs Reviewed  BASIC METABOLIC PANEL - Abnormal; Notable for the following:       Result Value   BUN 22 (*)    Creatinine, Ser 1.84 (*)    Calcium 8.6 (*)    GFR calc non Af Amer 31 (*)    GFR calc Af Amer 36 (*)    All other components within normal limits  CBC WITH DIFFERENTIAL/PLATELET - Abnormal; Notable for the following:    Hemoglobin 12.0 (*)    HCT 36.4 (*)    All other components within normal limits    EKG  EKG Interpretation None       Radiology No results found.  Procedures Procedures (including critical care time)  Medications  Ordered in ED Medications  HYDROcodone-acetaminophen (NORCO/VICODIN) 5-325 MG per tablet 1 tablet (1 tablet Oral Given 10/30/16 1420)     Initial Impression / Assessment and Plan / ED Course  I have reviewed the triage vital signs and the nursing notes.  Pertinent labs & imaging results that were available during my care of the patient were reviewed by me and considered in my medical decision making (see chart for details).     Pt here for evaluation  of right calf pain.  He is NVI on exam.  No evidence of acute infectious process.  Vascular US is negative for DVT.  Counseled pt on home care for muscle strain, outpatient follow up and return precautions.    Final Clinical Impressions(s) / ED Diagnoses   Final diagnoses:  Right leg pain  Strain of calf muscle, right, initial encounter    New Prescriptions Discharge Medication List as of 10/30/2016  3:12 PM    START taking these medications   Details  diclofenac sodium (VOLTAREN) 1 % GEL Apply 2 g topically 3 (three) times daily as needed., Starting Wed 10/30/2016, Print         Quintella Reichert, MD 10/30/16 [phone]

## 2016-10-30 NOTE — Progress Notes (Signed)
VASCULAR LAB PRELIMINARY  PRELIMINARY  PRELIMINARY  PRELIMINARY  Right lower extremity venous duplex completed.    Preliminary report:  Right:  No evidence of DVT, superficial thrombosis, or Baker's cyst.  Sinclair Alligood, RVS 10/30/2016, 2:16 PM

## 2016-10-30 NOTE — ED Notes (Signed)
PT DISCHARGED. INSTRUCTIONS AND PRESCRIPTION GIVEN. AAOX4. PT IN NO APPARENT DISTRESS. THE OPPORTUNITY TO ASK QUESTIONS WAS PROVIDED. 

## 2016-12-04 ENCOUNTER — Encounter: Payer: Self-pay | Admitting: Podiatry

## 2016-12-04 ENCOUNTER — Ambulatory Visit (INDEPENDENT_AMBULATORY_CARE_PROVIDER_SITE_OTHER): Payer: Medicare Other | Admitting: Podiatry

## 2016-12-04 DIAGNOSIS — M79672 Pain in left foot: Secondary | ICD-10-CM

## 2016-12-04 DIAGNOSIS — M79671 Pain in right foot: Secondary | ICD-10-CM

## 2016-12-04 DIAGNOSIS — B351 Tinea unguium: Secondary | ICD-10-CM

## 2016-12-04 NOTE — Patient Instructions (Signed)
Seen for hypertrophic nails. All nails debrided. Return in 3 months or as needed.  

## 2016-12-04 NOTE — Progress Notes (Signed)
Subjective:  83year old male presents complaining of painful nails and requesting toe nails trimmed.  Having pain on right lower leg since heavy tilting in gardening.   Objective:  Thick hypertrophic nails x 10.  HAV with bunion bilateral. MMN x 10.  Pedal pulses are faintly palpable in DP, not palpable in PT bilateral.  No abnormal skin lesions noted.  Assessment: Onychomycosis x 10.  Hallux valgus with bunion bilateral, asymptomatic.   Plan: Debrided all nails x 10.  Return in 3 month for routine foot care.

## 2017-02-04 ENCOUNTER — Ambulatory Visit (INDEPENDENT_AMBULATORY_CARE_PROVIDER_SITE_OTHER): Payer: Medicare Other | Admitting: Family Medicine

## 2017-02-04 ENCOUNTER — Encounter: Payer: Self-pay | Admitting: Family Medicine

## 2017-02-04 VITALS — BP 141/65 | HR 65 | Temp 98.6°F | Resp 18 | Ht 66.0 in | Wt 207.2 lb

## 2017-02-04 DIAGNOSIS — I10 Essential (primary) hypertension: Secondary | ICD-10-CM | POA: Diagnosis not present

## 2017-02-04 DIAGNOSIS — R55 Syncope and collapse: Secondary | ICD-10-CM | POA: Diagnosis not present

## 2017-02-04 DIAGNOSIS — E860 Dehydration: Secondary | ICD-10-CM | POA: Diagnosis not present

## 2017-02-04 DIAGNOSIS — R42 Dizziness and giddiness: Secondary | ICD-10-CM | POA: Diagnosis not present

## 2017-02-04 LAB — POC MICROSCOPIC URINALYSIS (UMFC): Mucus: ABSENT

## 2017-02-04 LAB — GLUCOSE, POCT (MANUAL RESULT ENTRY): POC Glucose: 99 mg/dl (ref 70–99)

## 2017-02-04 LAB — POCT URINALYSIS DIP (MANUAL ENTRY)
Bilirubin, UA: NEGATIVE
Blood, UA: NEGATIVE
Glucose, UA: NEGATIVE mg/dL
Ketones, POC UA: NEGATIVE mg/dL
LEUKOCYTES UA: NEGATIVE
NITRITE UA: NEGATIVE
PH UA: 5.5 (ref 5.0–8.0)
PROTEIN UA: NEGATIVE mg/dL
Spec Grav, UA: 1.015 (ref 1.010–1.025)
Urobilinogen, UA: 1 E.U./dL

## 2017-02-04 NOTE — Progress Notes (Signed)
Chief Complaint  Patient presents with  . Dizziness    out in the garden this morning gleaning tomatoes and on the way coming back sat on the back of truck felt dizziness and faint.  EMS called  and checked out and told he was ok but is here to make sure everything is ok.    HPI  Pt reports that he was out in his tomatoes about 5 rows On the way back from reaping tomatoes He felt very dizzy and laid back so the people thought he fainted so they called EMS EMS evaluated him and  He reports that he took all his meds which includes losartan and hctz He did not eat this morning and was busy working and did not drink much water Since EMS was called he reports that he drank plenty of water.  He drove back from [facility] today around 11am   Past Medical History:  Diagnosis Date  . Allergic rhinitis   . Anemia   . Arthritis    osteoarthritis  . Asthma 1994   with bronchitis  . Cough   . Hypertension   . Obstructive sleep apnea    uses a C-Pap    Current Outpatient Prescriptions  Medication Sig Dispense Refill  . acetaminophen (TYLENOL 8 HOUR ARTHRITIS PAIN) 650 MG CR tablet Take 650 mg by mouth every 8 (eight) hours as needed for pain.    Marland Kitchen diclofenac sodium (VOLTAREN) 1 % GEL Apply 2 g topically 3 (three) times daily as needed. 100 g 0  . hydrochlorothiazide (HYDRODIURIL) 25 MG tablet Take 25 mg by mouth daily.     Marland Kitchen losartan (COZAAR) 100 MG tablet Take 100 mg by mouth every morning.    . mometasone (NASONEX) 50 MCG/ACT nasal spray 1-2 sprays each nostril daily at bedtime while needed 17 g 12  . NONFORMULARY OR COMPOUNDED ITEM Allergy Vaccine 1:10 Given at [facility]    . Omega 3 1000 MG CAPS Take 1 capsule by mouth.    . rosuvastatin (CRESTOR) 20 MG tablet Take 20 mg by mouth daily.    Marland Kitchen albuterol (PROVENTIL HFA;VENTOLIN HFA) 108 (90 BASE) MCG/ACT inhaler Inhale 2 puffs into the lungs every 6 (six) hours as needed for wheezing or shortness of breath. (Patient not taking:  Reported on 10/30/2016) 1 Inhaler 6  . atorvastatin (LIPITOR) 80 MG tablet Take 80 mg by mouth daily at 6 PM.      No current facility-administered medications for this visit.     Allergies: No Known Allergies  Past Surgical History:  Procedure Laterality Date  . COLONOSCOPY W/ POLYPECTOMY    . none    . TOTAL KNEE ARTHROPLASTY Right 10/04/2013   Procedure: RIGHT TOTAL KNEE ARTHROPLASTY;  Surgeon: Gearlean Alf, MD;  Location: [facility];  Service: Orthopedics;  Laterality: Right;    Social History   Social History  . Marital status: Married    Spouse name: N/A  . Number of children: N/A  . Years of education: N/A   Social History Main Topics  . Smoking status: Former Smoker    Packs/day: 0.20    Years: 15.00    Types: Cigarettes    Quit date: 07/01/1968  . Smokeless tobacco: Former Systems developer     Comment: Quit 50 yrs ago as of 2014  . Alcohol use 1.8 oz/week    3 Cans of beer per week     Comment: occasionally  . Drug use: No  . Sexual activity: Not Asked  Other Topics Concern  . None   Social History Narrative  . None    ROS See hpi  Objective: Vitals:   02/04/17 1535  BP: (!) 141/65  Pulse: 65  Resp: 18  Temp: 98.6 F (37 C)  TempSrc: Oral  SpO2: 97%  Weight: 207 lb 3.2 oz (94 kg)  Height: 5\' 6"  (1.676 m)    Physical Exam  Constitutional: He is oriented to person, place, and time. He appears well-developed and well-nourished.  HENT:  Head: Normocephalic and atraumatic.  Mouth/Throat: Oropharynx is clear and moist.  Eyes: Conjunctivae and EOM are normal.  Cardiovascular: Normal rate, regular rhythm and normal heart sounds.   No murmur heard. Pulmonary/Chest: Effort normal and breath sounds normal. No respiratory distress. He has no wheezes.  Abdominal: Soft. Bowel sounds are normal. He exhibits no distension. There is no tenderness.  Musculoskeletal: Normal range of motion. He exhibits no edema.  Neurological: He is alert and oriented to person, place,  and time.  Skin: Skin is warm. Capillary refill takes less than 2 seconds. No pallor.  Psychiatric: He has a normal mood and affect. His behavior is normal. Judgment and thought content normal.    ECG with nsr, no st elevation  Assessment and Plan Yahmir was seen today for dizziness.  Diagnoses and all orders for this visit:  Dizziness  And Near syncope  Likely due to dehydration and heat exhaustion Pt aggressively hydrated and is back to baseline No other interventions at this time No orthostatic hypotension -     Basic metabolic panel -     POCT urinalysis dipstick -     POCT Microscopic Urinalysis ([facility]) -     POCT glucose (manual entry) -     Orthostatic vital signs -     EKG 12-Lead  Essential hypertension- bp in good range EKG reviewed  -     EKG 12-Lead  Dehydration- advised continued hydration     Cord Wilczynski A Nolon Rod

## 2017-02-04 NOTE — Patient Instructions (Addendum)
IF you received an x-ray today, you will receive an invoice from [facility]. Please contact [facility] at [phone] with questions or concerns regarding your invoice.   IF you received labwork today, you will receive an invoice from [place] [facility]. Please contact [facility] at [phone] with questions or concerns regarding your invoice.   Our billing staff will not be able to assist you with questions regarding bills from these companies.  You will be contacted with the lab results as soon as they are available. The fastest way to get your results is to activate your My Chart account. Instructions are located on the last page of this paperwork. If you have not heard from [place] regarding the results in 2 weeks, please contact this office.     Dehydration Dehydration is a condition in which there is not enough fluid or water in the body. This happens when you lose more fluids than you take in. Important organs, such as the kidneys, brain, and heart, cannot function without a proper amount of fluids. Any loss of fluids from the body can lead to dehydration. People age 48 or older have a higher risk of dehydration than younger adults because in older age, the body:  Is less able to conserve water.  Does not respond to temperature changes as well.  Does not get thirsty as easily or quickly.  Dehydration can range from mild to severe. This condition should be treated right away to prevent it from becoming severe. What are the causes? Dehydration may be caused by:  Vomiting.  Diarrhea.  Excessive sweating, such as from heat exposure or exercise.  Not drinking enough fluid, especially: ? When ill. ? While doing activity that requires a lot of energy.  Excessive urination.  Fever.  Infection.  Certain medicines, such as medicines that cause the body to lose excess fluid (diuretics).  Inability to access safe drinking water.  Poorly controlled blood  sugars.  Reduced physical ability to get adequate water and food.  What increases the risk? This condition is more likely to develop in people who:  Have a long-term (chronic) illness, such as: ? An illness that may increase urination, such as diabetes. ? Kidney, heart, or lung disease. ? Neurological or psychological disorders, such as dementia.  Are age 73 or older.  Are disabled.  Live in a place with high altitude.  What are the signs or symptoms? Symptoms of mild dehydration may include:  Thirst.  Dry lips.  Slightly dry mouth.  Dry, warm skin.  Dizziness. Symptoms of moderate dehydration may include:  Very dry mouth.  Muscle cramps.  Dark urine. Urine may be the color of tea.  Decreased urine production.  Decreased tear production.  Heartbeat that is irregular or faster than normal (palpitations).  Headache.  Light-headedness, especially when you stand up from a sitting position.  Fainting (syncope). Symptoms of severe dehydration may include:  Changes in skin, such as: ? Cold and clammy skin. ? Blotchy (mottled) or pale skin. ? Skin that does not quickly return to normal after being lightly pinched and released (poor skin turgor).  Changes in body fluids, such as: ? Extreme thirst. ? No tear production. ? Inability to sweat when body temperature is high, such as in hot weather. ? Very little urine production.  Changes in vital signs, such as: ? Weak pulse. ? Pulse that is more than 100 beats a minute when sitting still. ? Rapid breathing. ? Low blood pressure.  Other changes,  such as: ? Sunken eyes. ? Cold hands and feet. ? Confusion. ? Lack of energy (lethargy). ? Difficulty waking up from sleep. ? Short-term weight loss. ? Unconsciousness. How is this diagnosed? This condition is diagnosed based on your symptoms and a physical exam. Blood and urine tests may be done to help confirm the diagnosis. How is this treated? Treatment  for this condition depends on the severity. Mild or moderate dehydration can often be treated at home. Treatment should be started right away. Do not wait until dehydration becomes severe. Severe dehydration is an emergency and it needs to be treated in a hospital. Treatment for mild dehydration may include:  Drinking more fluids.  Replacing salts and minerals in your blood (electrolytes). Treatment for moderate dehydration may include:  Drinking an oral rehydration solution (ORS). This is a drink that helps you replace fluids and electrolytes (rehydrate). It is found at pharmacies and retail stores. Treatment for severe dehydration may include:  Receiving fluids through an IV tube.  Receiving an electrolyte solution through a tube passed through your nose and into your stomach (nasogastric tube or NG tube).  Correcting abnormalities in electrolytes.  Treating the underlying cause of dehydration. Follow these instructions at home:  If directed by your health care provider, drink an ORS: ? Make an ORS by following instructions on the package. ? Start by drinking small amounts, about  cup (120 mL) every 5-10 minutes. ? Slowly increase how much you drink until you have taken the amount recommended by your health care provider.  Drink enough clear fluid to keep your urine clear or pale yellow. If you were told to drink an ORS, finish the ORS first, then start slowly drinking other clear fluids. Drink fluids such as: ? Water. Do not drink only water. Doing that can lead to having too little salt (sodium) in the body (hyponatremia). ? Ice chips. ? Fruit juice that you have added water to (diluted fruit juice). ? Low-calorie sports drinks.  Avoid: ? Alcohol. ? Drinks that contain a lot of sugar. These include high-calorie sports drinks, fruit juice that is not diluted, and soda. ? Caffeine. ? Foods that are greasy or contain a lot of fat or sugar.  Take over-the-counter and  prescription medicines only as told by your health care provider.  Do not take sodium tablets. This can lead to having too much sodium in the body (hypernatremia).  Eat foods that contain a healthy balance of electrolytes, such as bananas, oranges, potatoes, tomatoes, and spinach.  Keep all follow-up visits as told by your health care provider. This is important. Contact a health care provider if:  You have abdominal pain that: ? Gets worse. ? Stays in one area (localizes).  You have a rash.  You have a stiff neck.  You are sleepier, more irritable, or more difficult to wake up than usual.  You feel weak, dizzy, or very thirsty. Get help right away if:  You have: ? Symptoms of severe dehydration. ? A fever. ? A severe headache. ? Vomiting or diarrhea that gets worse or does not go away. ? Diarrhea for more than 24 hours. ? Blood or green matter (bile) in your vomit. ? Blood in your stool. This may cause stool to look black and tarry. ? Trouble breathing.  You cannot drink fluids without vomiting.  Your symptoms get worse with treatment.  You have not urinated in 6-8 hours.  You have urinated only a small amount of very dark urine over  6-8 hours.  You faint.  Your heart rate while sitting still is over 100 beats a minute. This information is not intended to replace advice given to you by your health care provider. Make sure you discuss any questions you have with your health care provider. Document Released: 09/07/2003 Document Revised: 01/05/2016 Document Reviewed: 08/11/2015 Elsevier Interactive Patient Education  2017 [employer].

## 2017-02-05 LAB — BASIC METABOLIC PANEL
BUN/Creatinine Ratio: 13 (ref 10–24)
BUN: 19 mg/dL (ref 8–27)
CALCIUM: 8.8 mg/dL (ref 8.6–10.2)
CHLORIDE: 102 mmol/L (ref 96–106)
CO2: 23 mmol/L (ref 20–29)
Creatinine, Ser: 1.52 mg/dL — ABNORMAL HIGH (ref 0.76–1.27)
GFR, EST AFRICAN AMERICAN: 47 mL/min/{1.73_m2} — AB (ref >59)
GFR, EST NON AFRICAN AMERICAN: 40 mL/min/{1.73_m2} — AB (ref >59)
Glucose: 91 mg/dL (ref 65–99)
Potassium: 4.1 mmol/L (ref 3.5–5.2)
Sodium: 143 mmol/L (ref 134–144)

## 2017-03-06 ENCOUNTER — Encounter: Payer: Self-pay | Admitting: Podiatry

## 2017-03-06 ENCOUNTER — Ambulatory Visit (INDEPENDENT_AMBULATORY_CARE_PROVIDER_SITE_OTHER): Payer: Medicare Other | Admitting: Podiatry

## 2017-03-06 DIAGNOSIS — M79672 Pain in left foot: Secondary | ICD-10-CM

## 2017-03-06 DIAGNOSIS — B351 Tinea unguium: Secondary | ICD-10-CM

## 2017-03-06 DIAGNOSIS — M79671 Pain in right foot: Secondary | ICD-10-CM | POA: Diagnosis not present

## 2017-03-06 NOTE — Progress Notes (Signed)
Subjective:  83year old male presents complaining of painful nails and requesting toe nails trimmed.   Objective:  Thick hypertrophic nails x 10.  HAV with bunion bilateral. MMN x 10.  Pedal pulses are faintly palpable in DP, not palpable in PT bilateral.  No abnormal skin lesions noted.  Assessment: Onychomycosis x 10.  Hallux valgus with bunion bilateral, asymptomatic.   Plan: Debrided all nails x 10.  Return in 3 month for routine foot care.

## 2017-03-06 NOTE — Patient Instructions (Signed)
Seen for hypertrophic nails. All nails debrided. Return in 3 months or as needed.  

## 2017-04-02 ENCOUNTER — Emergency Department (HOSPITAL_COMMUNITY): Payer: Medicare Other

## 2017-04-02 ENCOUNTER — Emergency Department (HOSPITAL_COMMUNITY)
Admission: EM | Admit: 2017-04-02 | Discharge: 2017-04-02 | Disposition: A | Discharge status: Home / Self Care | Payer: Medicare Other | Attending: Emergency Medicine | Admitting: Emergency Medicine

## 2017-04-02 ENCOUNTER — Encounter (HOSPITAL_COMMUNITY): Payer: Self-pay | Admitting: Emergency Medicine

## 2017-04-02 DIAGNOSIS — E785 Hyperlipidemia, unspecified: Secondary | ICD-10-CM | POA: Diagnosis not present

## 2017-04-02 DIAGNOSIS — S0181XA Laceration without foreign body of other part of head, initial encounter: Secondary | ICD-10-CM | POA: Insufficient documentation

## 2017-04-02 DIAGNOSIS — Y998 Other external cause status: Secondary | ICD-10-CM | POA: Insufficient documentation

## 2017-04-02 DIAGNOSIS — Y929 Unspecified place or not applicable: Secondary | ICD-10-CM | POA: Diagnosis not present

## 2017-04-02 DIAGNOSIS — Z23 Encounter for immunization: Secondary | ICD-10-CM | POA: Diagnosis not present

## 2017-04-02 DIAGNOSIS — I10 Essential (primary) hypertension: Secondary | ICD-10-CM | POA: Diagnosis not present

## 2017-04-02 DIAGNOSIS — S0990XA Unspecified injury of head, initial encounter: Secondary | ICD-10-CM

## 2017-04-02 DIAGNOSIS — W19XXXA Unspecified fall, initial encounter: Secondary | ICD-10-CM | POA: Insufficient documentation

## 2017-04-02 DIAGNOSIS — Y939 Activity, unspecified: Secondary | ICD-10-CM | POA: Insufficient documentation

## 2017-04-02 DIAGNOSIS — J45909 Unspecified asthma, uncomplicated: Secondary | ICD-10-CM | POA: Insufficient documentation

## 2017-04-02 DIAGNOSIS — Z87891 Personal history of nicotine dependence: Secondary | ICD-10-CM | POA: Insufficient documentation

## 2017-04-02 DIAGNOSIS — Z79899 Other long term (current) drug therapy: Secondary | ICD-10-CM | POA: Insufficient documentation

## 2017-04-02 LAB — I-STAT CHEM 8, ED
BUN: 22 mg/dL — ABNORMAL HIGH (ref 6–20)
CALCIUM ION: 1.13 mmol/L — AB (ref 1.15–1.40)
Chloride: 104 mmol/L (ref 101–111)
Creatinine, Ser: 1.5 mg/dL — ABNORMAL HIGH (ref 0.61–1.24)
GLUCOSE: 91 mg/dL (ref 65–99)
HCT: 35 % — ABNORMAL LOW (ref 39.0–52.0)
HEMOGLOBIN: 11.9 g/dL — AB (ref 13.0–17.0)
Potassium: 3.4 mmol/L — ABNORMAL LOW (ref 3.5–5.1)
SODIUM: 142 mmol/L (ref 135–145)
TCO2: 27 mmol/L (ref 22–32)

## 2017-04-02 LAB — I-STAT TROPONIN, ED: Troponin i, poc: 0.03 ng/mL (ref 0.00–0.08)

## 2017-04-02 LAB — CBG MONITORING, ED: GLUCOSE-CAPILLARY: 86 mg/dL (ref 65–99)

## 2017-04-02 MED ORDER — TETANUS-DIPHTH-ACELL PERTUSSIS 5-2.5-18.5 LF-MCG/0.5 IM SUSP
0.5000 mL | Freq: Once | INTRAMUSCULAR | Status: AC
  Administered 2017-04-02: 0.5 mL via INTRAMUSCULAR
  Filled 2017-04-02: qty 0.5

## 2017-04-02 NOTE — Discharge Instructions (Signed)
I apologize for the time you waited during your emergency department visit today.   Your CT scan, blood work and EKG were normal. Your laceration was repaired with a skin adhesive called Dermabond. Please read below instructions for care.   DERMABOND* Topical Skin Adhesive (2-octyl cyanoacrylate) is a sterile, liquid skin adhesive that holds wound edges together. The film will usually remain in place for 5 to 10 days, then naturally fall off your skin. The following will answer some of your questions and provide instructions for proper care for your wound while it is healing:  CHECK WOUND APPEARANCE  Some swelling, redness, and pain are common with all wounds and normally will go away as the wound heals. If swelling, redness, or pain increases or if the wound feels warm to the touch, contact a doctor. Also contact a doctor if the wound edges reopen or separate.  REPLACE BANDAGES  If your wound is bandaged, keep the bandage dry.  Replace the dressing daily until the adhesive film has fallen off or if the bandage should become wet, unless otherwise instructed by your physician.  When changing the dressing, do not place tape directly over the DERMABOND adhesive film, because removing the tape later may also remove the film.  AVOID TOPICAL MEDICATIONS  Do not apply liquid or ointment medications or any other product to your wound while the DERMABOND adhesive film is in place. These may loosen the film before your wound is healed.  KEEP WOUND DRY AND PROTECTED  You may occasionally and briefly wet your wound in the shower or bath. Do not soak or scrub your wound, do not swim, and avoid periods of heavy perspiration until the DERMABOND adhesive has naturally fallen off. After showering or bathing, gently blot your wound dry with a soft towel. If a protective dressing is being used, apply a fresh, dry bandage, being sure to keep the tape off the DERMABOND adhesive film.  Apply a clean, dry bandage over  the wound if necessary to protect it.  Protect your wound from injury until the skin has had sufficient time to heal.  Do not scratch, rub, or pick at the DERMABOND adhesive film. This may loosen the film before your wound is healed.  Protect the wound from prolonged exposure to sunlight or tanning lamps while the film is in place.  Follow up with your primary care provider as needed  Return to ED for worsening symptoms

## 2017-04-02 NOTE — ED Notes (Signed)
Pt was encouraged to increase fluids---- sprite given.

## 2017-04-02 NOTE — ED Notes (Signed)
Notified EDP,Glick,MD.,Pt. I-stat Chem 8 results potassium 3.4 and hemo globin 11.9 and RN,Allan.

## 2017-04-02 NOTE — ED Provider Notes (Signed)
[place] [facility] DEPT Provider Note   CSN: [id number] Arrival date & time: 04/02/17  0001     History   Chief Complaint Chief Complaint  Patient presents with  . Fall  . Head Injury    HPI Christopher Shepherd is a 83 y.o. male with history of hypertension and hyperlipidemia presents to ED for evaluation of laceration to his forehead sustains after fall PTA. Patient is unsure of exact details of fall, thinks he may have been still asleep when he tried to go to the bathroom. Remembers falling but no details prior. He was able to get up and ambulate to the bathroom and has felt at his baseline since the fall. Denies preceding illnesses. No recent fevers, vomiting, diarrhea, cough, chest pain. Was evaluated by urgent care 2 months ago for spell of dizziness, workup was benign per patient. No history of TIA/CVA, MI in the past.  HPI  Past Medical History:  Diagnosis Date  . Allergic rhinitis   . Anemia   . Arthritis    osteoarthritis  . Asthma 1994   with bronchitis  . Cough   . Hypertension   . Obstructive sleep apnea    uses a C-Pap    Patient Active Problem List   Diagnosis Date Noted  . Chronic asthmatic bronchitis with acute exacerbation ([place]) 07/16/2014  . Pain in lower limb 11/24/2013  . S/P total knee arthroplasty 10/23/2013  . Hyperlipidemia 10/08/2013  . Constipation 10/08/2013  . Essential hypertension, benign 10/08/2013  . Hiccups 10/08/2013  . Acute blood loss anemia 10/06/2013  . OA (osteoarthritis) of knee 10/04/2013  . Onychomycosis 10/30/2012  . Pain in joint, ankle and foot 10/30/2012  . Allergic rhinitis due to pollen 09/05/2010  . Obstructive sleep apnea 04/16/2007  . Allergic-infective asthma 04/16/2007  . COUGH 04/16/2007    Past Surgical History:  Procedure Laterality Date  . COLONOSCOPY W/ POLYPECTOMY    . none    . TOTAL KNEE ARTHROPLASTY Right 10/04/2013   Procedure: RIGHT TOTAL KNEE ARTHROPLASTY;  Surgeon: Gearlean Alf, MD;  Location: [facility]  [facility];  Service: Orthopedics;  Laterality: Right;       Home Medications    Prior to Admission medications   Medication Sig Start Date End Date Taking? Authorizing Provider  acetaminophen (TYLENOL 8 HOUR ARTHRITIS PAIN) 650 MG CR tablet Take 650 mg by mouth every 8 (eight) hours as needed for pain.   Yes [provider]  diclofenac sodium (VOLTAREN) 1 % GEL Apply 2 g topically 3 (three) times daily as needed. Patient taking differently: Apply 2 g topically 3 (three) times daily as needed (pain).  10/30/16  Yes Quintella Reichert, MD  hydrochlorothiazide (HYDRODIURIL) 25 MG tablet Take 25 mg by mouth daily.    Yes [provider]  losartan (COZAAR) 100 MG tablet Take 100 mg by mouth every morning.   Yes [provider]  mometasone (NASONEX) 50 MCG/ACT nasal spray 1-2 sprays each nostril daily at bedtime while needed Patient taking differently: Place 1-2 sprays into the nose at bedtime as needed (allergies).  08/12/16  Yes Young, Tarri Fuller D, MD  Omega 3 1000 MG CAPS Take 1 capsule by mouth.   Yes [provider]  rosuvastatin (CRESTOR) 20 MG tablet Take 20 mg by mouth daily.   Yes [provider]  NONFORMULARY OR COMPOUNDED ITEM Allergy Vaccine 1:10 Given at [facility]    [provider]    Family History Family History  Problem Relation Age of Onset  .  Cancer Father        cause of death  . Colon cancer Mother        cause of death    Social History Social History  Substance Use Topics  . Smoking status: Former Smoker    Packs/day: 0.20    Years: 15.00    Types: Cigarettes    Quit date: 07/01/1968  . Smokeless tobacco: Former Systems developer     Comment: Quit 50 yrs ago as of 2014  . Alcohol use No     Comment: occasionally     Allergies   Patient has no known allergies.   Review of Systems Review of Systems  Constitutional: Negative for chills and fever.  HENT: Positive for facial swelling (forehead).   Eyes: Negative for  visual disturbance.  Respiratory: Negative for shortness of breath.   Cardiovascular: Negative for chest pain.  Gastrointestinal: Negative for abdominal pain, diarrhea and vomiting.  Genitourinary: Negative for difficulty urinating, dysuria and hematuria.  Musculoskeletal: Negative for neck pain and neck stiffness.  Skin: Positive for wound.  Neurological: Negative for speech difficulty, weakness, light-headedness, numbness and headaches.     Physical Exam Updated Vital Signs BP (!) 174/74   Pulse 61   Temp 98.9 F (37.2 C) (Oral)   Resp 19   Ht 5\' 8"  (1.727 m)   Wt 93.4 kg (206 lb)   SpO2 98%   BMI 31.32 kg/m   Physical Exam  Constitutional: He is oriented to person, place, and time. He appears well-developed and well-nourished. He is cooperative. He is easily aroused. No distress.  Pleasant elderly male, in NAD  HENT:  3 cm laceration to central forehead, straight w/o bleeding, appropriately tender No scalp or nasal bone tenderness No Raccoon's eyes. No Battle's sign. No hemotympanum, bilaterally. No epistaxis, septum midline No intraoral bleeding or injury  Eyes:  Lids normal EOMs and PERRL intact without pain No conjunctival injection  Neck:  No cervical spinous process tenderness No cervical paraspinal muscular tenderness Full active ROM of cervical spine 2+ carotid pulses bilaterally without bruits Trachea midline  Cardiovascular: Normal rate, regular rhythm, S1 normal, S2 normal and normal heart sounds.  Exam reveals no distant heart sounds and no friction rub.   No murmur heard. Pulses:      Carotid pulses are 2+ on the right side, and 2+ on the left side.      Radial pulses are 2+ on the right side, and 2+ on the left side.       Dorsalis pedis pulses are 2+ on the right side, and 2+ on the left side.  Pulmonary/Chest: Effort normal. No respiratory distress. He has no decreased breath sounds.  No chest wall tenderness Equal and symmetric chest wall  expansion   Abdominal:  Abdomen is soft NTND  Musculoskeletal: Normal range of motion. He exhibits no deformity.  Painless PROM of upper and lower extremities w/o reported pain  Neurological: He is alert, oriented to person, place, and time and easily aroused.  A&O to self, place and time. Speech and phonation normal. Thought process coherent.   Strength 5/5 in upper and lower extremities.   Sensation to light touch intact in upper and lower extremities.  Gait normal.   Negative Romberg. No leg drift.  Intact finger to nose test. CN I not tested. CN II - XII intact bilaterally     ED Treatments / Results  Labs (all labs ordered are listed, but only abnormal results are displayed) Labs Reviewed  I-STAT CHEM 8, ED - Abnormal; Notable for the following:       Result Value   Potassium 3.4 (*)    BUN 22 (*)    Creatinine, Ser 1.50 (*)    Calcium, Ion 1.13 (*)    Hemoglobin 11.9 (*)    HCT 35.0 (*)    All other components within normal limits  CBG MONITORING, ED  I-STAT TROPONIN, ED    EKG  EKG Interpretation  Date/Time:  Wednesday April 02 2017 06:57:28 EDT Ventricular Rate:  62 PR Interval:    QRS Duration: 99 QT Interval:  464 QTC Calculation: 472 R Axis:   31 Text Interpretation:  Possible wandering atrial pacemaker Borderline T wave abnormalities When compared with ECG of 10/11/2013, No significant change was found Confirmed by Delora Fuel ([id number]) on 04/02/2017 7:02:06 AM Also confirmed by Delora Fuel ([id number]), editor Hattie Perch [phone])  on 04/02/2017 7:31:15 AM       Radiology Ct Head Wo Contrast  Result Date: 04/02/2017 CLINICAL DATA:  [place] from chair striking the head with laceration. EXAM: CT HEAD WITHOUT CONTRAST TECHNIQUE: Contiguous axial images were obtained from the base of the skull through the vertex without intravenous contrast. COMPARISON:  None. FINDINGS: Brain: Mild generalized atrophy. Minimal small vessel ischemic change of the hemispheric  white matter. No sign of acute infarction, mass lesion, hemorrhage, hydrocephalus or extra-axial collection. Vascular: There is atherosclerotic calcification of the major vessels at the base of the brain. Skull: No skull fracture Sinuses/Orbits: Clear/normal Other: None IMPRESSION: No acute or traumatic finding. Minimal age related atrophy and small-vessel change of the hemispheric white matter. Electronically Signed   By: Nelson Chimes M.D.   On: 04/02/2017 08:09    Procedures Procedures (including critical care time)  Medications Ordered in ED Medications  Tdap (BOOSTRIX) injection 0.5 mL (0.5 mLs Intramuscular Given 04/02/17 0734)     Initial Impression / Assessment and Plan / ED Course  I have reviewed the triage vital signs and the nursing notes.  Pertinent labs & imaging results that were available during my care of the patient were reviewed by me and considered in my medical decision making (see chart for details).    83 year old male presents to ED for laceration to forehead after fall. He is unsure of prodromal symptoms or preceding events. Thinks he was not fully awake and fell forward. He is at baseline now, asymptomatic. No anticoagulation. No previous history of TIA, CVA, MI. He is relatively healthy for a 83 year old with only hypertension, hyperlipidemia. Exam is reassuring. Small laceration to forehead which was repaired with Dermabond. Given age and unclear preceding events leading up to fall screening labs were obtained. These were normal. EKG and CT scan of head normal. He is not orthostatic. We'll discharge patient with PCP follow-up. Strict ED return precautions given. Patient requested discharge and did not want any further workup. Patient discussed and evaluated by supervising physician is agreeable with ED treatment and discharge plan  Final Clinical Impressions(s) / ED Diagnoses   Final diagnoses:  Injury of head, initial encounter  Fall, initial encounter  Laceration  of forehead, initial encounter    New Prescriptions New Prescriptions   No medications on file     Arlean Hopping 56/38/93 [id number]    Delora Fuel, MD 87/68/11 0600

## 2017-04-02 NOTE — ED Triage Notes (Signed)
Pt comes in after a fall this evening after he got out of his recliner "half asleep" and stumbled and fell.  Pt is unsure what he hit his head on.  Ambulatory in triage.  A&O x4. Denies being on blood thinners.  Took a tylenol for pain relief.

## 2017-04-18 ENCOUNTER — Ambulatory Visit (INDEPENDENT_AMBULATORY_CARE_PROVIDER_SITE_OTHER): Payer: Medicare Other

## 2017-04-18 DIAGNOSIS — Z23 Encounter for immunization: Secondary | ICD-10-CM | POA: Diagnosis not present

## 2017-06-05 ENCOUNTER — Ambulatory Visit: Payer: Medicare Other | Admitting: Podiatry

## 2017-06-12 ENCOUNTER — Ambulatory Visit (INDEPENDENT_AMBULATORY_CARE_PROVIDER_SITE_OTHER): Payer: Medicare Other | Admitting: Podiatry

## 2017-06-12 DIAGNOSIS — M79671 Pain in right foot: Secondary | ICD-10-CM

## 2017-06-12 DIAGNOSIS — B351 Tinea unguium: Secondary | ICD-10-CM | POA: Diagnosis not present

## 2017-06-12 DIAGNOSIS — M79672 Pain in left foot: Secondary | ICD-10-CM

## 2017-06-12 NOTE — Patient Instructions (Signed)
Seen for hypertrophic nails. All nails debrided. Return in 3 months or as needed.  

## 2017-06-13 ENCOUNTER — Encounter: Payer: Self-pay | Admitting: Podiatry

## 2017-06-13 NOTE — Progress Notes (Signed)
Subjective: 83 y.o. year old male patient presents complaining of pain in both great toes. Patient requests toe nails trimmed.  Patient denies any new problems.  Objective: Dermatologic: Thick yellow deformed nails x 10. Vascular: Pedal pulses are all palpable. Orthopedic: Contracted lesser digits bilateral. Neurologic: All epicritic and tactile sensations grossly intact.  Assessment: Dystrophic mycotic nails x 10. Painful toes.  Treatment: All mycotic nails debrided.  Return in 3 months or as needed.

## 2017-08-18 ENCOUNTER — Encounter: Payer: Self-pay | Admitting: Internal Medicine

## 2017-08-18 ENCOUNTER — Ambulatory Visit (INDEPENDENT_AMBULATORY_CARE_PROVIDER_SITE_OTHER): Payer: Medicare Other | Admitting: Internal Medicine

## 2017-08-18 VITALS — BP 128/62 | HR 63 | Ht 66.0 in | Wt 210.0 lb

## 2017-08-18 DIAGNOSIS — G4733 Obstructive sleep apnea (adult) (pediatric): Secondary | ICD-10-CM | POA: Diagnosis not present

## 2017-08-18 DIAGNOSIS — J441 Chronic obstructive pulmonary disease with (acute) exacerbation: Secondary | ICD-10-CM | POA: Diagnosis not present

## 2017-08-18 NOTE — Patient Instructions (Signed)
Order- [facility]- please replace old BIPAP machine 12/8, mask of choice, humidifier, supplies, AirView   Please cal as needed

## 2017-08-18 NOTE — Progress Notes (Signed)
Subjective:    Patient ID: Christopher Shepherd, male    DOB: [birth year], 84 y.o.   MRN: [id number]  HPI male never smoker followed for cough, asthma, allergic rhinitis, obstructive sleep apnea  --------------------------------------------------------------------------------------------- 08/12/2016-84 year old male never smoker followed for cough, asthma, allergic rhinitis, OSA BiPAP 12/8/[facility] Vaccine ended when our allergy vaccine clinic program closed here December, 2017 1 year f/u for asthma and OSA. Breathing has been ok since last visit.  He isn't sure if he still needs allergy vaccine and I suggested he try off of it rather than assuming at his age that he needs to continue. He continues Nasonex with some seasonal variation, no epistaxis. Doing very well with BiPAP at current pressures. He feels comfortable with this and says he sleeps well. No wheeze in a very long time but he wants to keep a rescue inhaler available. CXR 01/24/16 IMPRESSION: Borderline hyperinflation consistent with known reactive airway disease. No evidence of pneumonia nor CHF. Aortic atherosclerosis.  08/18/17- 84 year old male never smoker followed for cough, asthma, allergic rhinitis, OSA BiPAP 12/8/[facility] ----1 year ROV  No download available but he reports using BIPAP every night and depending on it because he sleeps better using it.  Pressure is comfortable. He has not been having wheeze cough or breathing discomfort and has had no acute respiratory infections this winter so far.  Review of Systems- see HPI + = positive Constitutional:   No-   weight loss, night sweats, fevers, chills, fatigue, lassitude. HEENT:   No-  headaches, difficulty swallowing, tooth/dental problems, sore throat,       No-  sneezing, itching, ear ache,    nasal congestion, +post nasal drip,  CV:  No-   chest pain, orthopnea, PND, swelling in lower extremities, anasarca,  dizziness, palpitations Resp: No-   shortness of  breath with exertion or at rest.               productive cough,   non-productive cough,  No-  coughing up of blood.               change in color of mucus.   wheezing.   Skin: No-   rash or lesions. GI:  No-   heartburn, indigestion, abdominal pain, nausea, vomiting,  GU:  MS:  No-   joint pain or swelling.   Neuro- nothing unusual  Psych:  No- change in mood or affect. No depression or anxiety.  No memory loss.   Objective:   Physical Exam General- Alert, Oriented, Affect-appropriate, Distress- none acute; + Overweight Skin- rash-none, lesions- none, excoriation- none Lymphadenopathy- none Head- atraumatic            Eyes- Gross vision intact, PERRLA, conjunctivae clear secretions            Ears- Hearing, canals-normal            Nose- + turbinate edema, no-Septal dev, mucus, polyps, erosion, perforation             Throat- Mallampati IV , mucosa dry , drainage- none, tonsils- atrophic Neck- flexible , trachea midline, no stridor , thyroid nl, carotid no bruit Chest - symmetrical excursion , unlabored           Heart/CV- RRR with skipped beats , no murmur , no gallop  , no rub, nl s1 s2                           - JVD- none ,  edema- none, stasis changes- none, varices- none           Lung- clear, wheeze- none, cough-none , dullness-none, rub- none           Chest wall-  Abd-  Br/ Gen/ Rectal- Not done, not indicated Extrem- cyanosis- none, clubbing, none, atrophy- none, strength- nl Neuro- grossly intact to observation Assessment & Plan:

## 2017-08-19 ENCOUNTER — Telehealth: Payer: Self-pay | Admitting: Internal Medicine

## 2017-08-19 NOTE — Telephone Encounter (Signed)
lmtcb for Barbaraann Rondo at [facility]. Pt was just seen yesterday by CY.

## 2017-08-20 NOTE — Telephone Encounter (Signed)
lmtcb x2 for Christopher Shepherd with [facility]

## 2017-08-21 NOTE — Telephone Encounter (Signed)
Done

## 2017-08-21 NOTE — Telephone Encounter (Signed)
Spoke with Barbaraann Rondo at [facility], states that 2/18 office visit note needs to be addended stating that pt has been on bipap and is benefitting from use.    CY please advise if note can be addended.  Otherwise [facility] will not be able to provide bipap for patient.  Thanks!

## 2017-08-21 NOTE — Assessment & Plan Note (Signed)
He continues to benefit from BiPAP used every night and comfortable for him.  He sleeps much better with it and does not want to sleep without it.  We discussed replacement of old machine. Plan-replace old BiPAP machine 12/18

## 2017-08-21 NOTE — Telephone Encounter (Signed)
Left detailed vm for Barbaraann Rondo at [facility] advising that [place] note has been addended.  Nothing further needed.

## 2017-08-21 NOTE — Assessment & Plan Note (Signed)
Much better symptom control in recent years with no significant wheezing or cough this winter and no acute exacerbations.

## 2017-08-22 ENCOUNTER — Telehealth: Payer: Self-pay | Admitting: Internal Medicine

## 2017-08-22 DIAGNOSIS — G4733 Obstructive sleep apnea (adult) (pediatric): Secondary | ICD-10-CM

## 2017-08-22 NOTE — Telephone Encounter (Signed)
Original sleep study 07/15/02 at [facility] was ordered by Dr Heath Gold. She was managing him on BIPAP as far back as 2012. At some point we took over his OSA care, but he has been on BIPAP from way back. Sorry [facility] didn't keep records that far back.

## 2017-08-22 NOTE — Telephone Encounter (Signed)
Spoke with Barbaraann Rondo again, he advised to call [facility] to see if they gave pt Bipap. I called over there and they stated he was a transfer from [place] and he receives supplies from them. PCC's please send order to [facility] in [place]. Nothing further is needed.

## 2017-08-22 NOTE — Telephone Encounter (Signed)
Spoke with pt, Medicare shows on file they only paid for a cpap so he is not sure where the patient received his bipap from. He will have to start the process all over again to receive a bipap. I have looked to see if there was any correspondance on this and it looks like he was on Bipap on the OV below. Please advise.   Patient Instructions by Deneise Lever, MD at 03/16/2015 3:47 PM   Author: Deneise Lever, MD Author Type: Physician Filed: 03/16/2015 3:50 PM  Note Status: Signed Cosign: Cosign Not Required Encounter Date: 03/16/2015  Editor: Deneise Lever, MD (Physician)    We can continue allergy vaccine 1:10 GH  We can continue BIPAP 12/ 8/ [facility]   Our goal is to use this when ever you sleep, all night every night. If it is uncomfortable, let the people at [facility] know, so they can work with it.  Script printed to hold for an albuterol rescue inhaler to use for wheezing if needed  Don't forget that flu shot, later this fall

## 2017-09-10 ENCOUNTER — Encounter: Payer: Self-pay | Admitting: Podiatry

## 2017-09-10 ENCOUNTER — Ambulatory Visit (INDEPENDENT_AMBULATORY_CARE_PROVIDER_SITE_OTHER): Payer: Medicare Other | Admitting: Podiatry

## 2017-09-10 DIAGNOSIS — M79671 Pain in right foot: Secondary | ICD-10-CM

## 2017-09-10 DIAGNOSIS — B351 Tinea unguium: Secondary | ICD-10-CM

## 2017-09-10 DIAGNOSIS — M79672 Pain in left foot: Secondary | ICD-10-CM

## 2017-09-10 NOTE — Patient Instructions (Signed)
Seen for hypertrophic nails. All nails debrided. Return in 3 months or as needed.  

## 2017-09-10 NOTE — Progress Notes (Signed)
Subjective: 84 y.o. year old male patient presents complaining of painful nails. Patient requests toe nails trimmed.  Denies any new problems.  Objective: Dermatologic: Thick yellow deformed nails x 10. Vascular: Pedal pulses are all palpable. Orthopedic: Contracted lesser digits bilateral.  Neurologic: All epicritic and tactile sensations grossly intact.  Assessment: Dystrophic mycotic nails x 10. Painful toes.  Treatment: All mycotic nails debrided.  Return in 3 months or as needed.

## 2017-12-11 ENCOUNTER — Ambulatory Visit: Payer: Medicare Other | Admitting: Podiatry

## 2018-02-13 ENCOUNTER — Encounter: Payer: Self-pay | Admitting: Women's Health

## 2018-02-19 ENCOUNTER — Telehealth: Payer: Self-pay | Admitting: Internal Medicine

## 2018-02-19 NOTE — Telephone Encounter (Signed)
Pt will need face to face with provider to get new machine ordered based on Insurance guidelines. Pt is aware and has been scheduled with Kenney Houseman, NP tomorrow at 9:00am. Pt to bring machine to show compliance as well. Nothing more needed at this time.

## 2018-02-20 ENCOUNTER — Encounter: Payer: Self-pay | Admitting: Nurse Practitioner

## 2018-02-20 ENCOUNTER — Ambulatory Visit (INDEPENDENT_AMBULATORY_CARE_PROVIDER_SITE_OTHER): Payer: Medicare Other | Admitting: Nurse Practitioner

## 2018-02-20 ENCOUNTER — Telehealth: Payer: Self-pay | Admitting: Nurse Practitioner

## 2018-02-20 VITALS — BP 122/68 | HR 60 | Ht 66.5 in | Wt 200.6 lb

## 2018-02-20 DIAGNOSIS — G4733 Obstructive sleep apnea (adult) (pediatric): Secondary | ICD-10-CM | POA: Diagnosis not present

## 2018-02-20 NOTE — Patient Instructions (Addendum)
Will order new bipap and supplies continue current medications Do not drive if drowsy Goal to wear BIPAP for at least 4 hours per night Will need follow up with Dr. Annamaria Boots 1 month after starting new machine Call if there are any issues with machine  Per Dr. Janee Morn last note - Order- [facility]- please replace old BIPAP machine 12/8, mask of choice, humidifier, supplies, AirView

## 2018-02-20 NOTE — Assessment & Plan Note (Addendum)
Patient continues to benefit from BiPAP. He uses it every night and is comfortable. He sleeps much better with it and does not want to sleep with out it. We will replace hs old machine.   Patient Instructions  Will order new bipap and supplies continue current medications Do not drive if drowsy Goal to wear BIPAP for at least 4 hours per night Will need follow up with Dr. Annamaria Boots 1 month after starting new machine Call if there are any issues with machine  Per Dr. Janee Morn last note - Order- [facility]- please replace old BIPAP machine 12/8, mask of choice, humidifier, supplies, AirView

## 2018-02-20 NOTE — Assessment & Plan Note (Signed)
Patient has had better symptom control in recent years with no significant wheezing or cough and no acute exacerbations.

## 2018-02-20 NOTE — Telephone Encounter (Signed)
Called and spoke with [place], [facility].  Corene Cornea recommended that Patient stay with [facility],because of insurance purposes.  Patient was currently with [facility].  Called and spoke with Patient.  Patient stated understanding.  New DME order placed for new BIPAP and supplies with [facility]. Nothing further at this time.

## 2018-02-20 NOTE — Progress Notes (Signed)
@Patient  ID: Christopher Shepherd, male    DOB: Jun 19, 1927, 84 y.o.   MRN: [id number]  Chief Complaint  Patient presents with  . Follow-up    Face to face visit required for new CPAP machine    Referring provider: Willey Blade, MD  HPI  84 year old male never smoker followed for cough, asthma, allergic rhinitis, and obstructive sleep apnea followed by Dr. Annamaria Boots.  Health history includes HTN.    Tests: CXR 01-24-16 Borderline hyperinflation consistent with known reactive airway disease. No evidence of pneumonia or CHF. Aortic atherosclerosis.    OV 02-20-18 Followed up OSA/need for new BIPAP machine, asthma  Patient presents for a follow up on OSA. He has been stable since last visit. He states that he uses his BIPAP every night and benefits from it because he sleeps better using it. Reports that the pressure is comfortable.   His asthma has been well controlled. He has stopped allergy shots. He continues nasonex. He denies any epitaxis. He denies any fever, wheezing, or cough.     No Known Allergies  Immunization History  Administered Date(s) Administered  . Influenza Split 04/12/2011, 03/01/2012  . Influenza Whole 04/01/2008, 04/26/2009  . Influenza, High Dose Seasonal PF 04/09/2016, 04/18/2017  . Influenza,inj,Quad PF,6+ Mos 04/06/2013, 03/15/2014, 04/18/2015  . Tdap 04/02/2017    Past Medical History:  Diagnosis Date  . Allergic rhinitis   . Anemia   . Arthritis    osteoarthritis  . Asthma 1994   with bronchitis  . Cough   . Hypertension   . Obstructive sleep apnea    uses a C-Pap    Tobacco History: Social History   Tobacco Use  Smoking Status Former Smoker  . Packs/day: 0.20  . Years: 15.00  . Pack years: 3.00  . Types: Cigarettes  . Last attempt to quit: 07/01/1968  . Years since quitting: 49.6  Smokeless Tobacco Former Systems developer  Tobacco Comment   Quit 50 yrs ago as of 2014   Counseling given: Yes Comment: Quit 50 yrs ago as of  2014   Outpatient Encounter Medications as of 02/20/2018  Medication Sig  . acetaminophen (TYLENOL 8 HOUR ARTHRITIS PAIN) 650 MG CR tablet Take 650 mg by mouth every 8 (eight) hours as needed for pain.  . Cholecalciferol (VITAMIN D-3) 1000 units CAPS cholecalciferol (vitamin D3) 2,000 unit tablet  1 tablet orally once a day  . Coenzyme Q10 (CO Q 10 PO) Co Q-10 300 mg capsule  Take 1 capsule every day by oral route.  . diclofenac sodium (VOLTAREN) 1 % GEL Apply 2 g topically 3 (three) times daily as needed. (Patient taking differently: Apply 2 g topically 3 (three) times daily as needed (pain). )  . hydrochlorothiazide (HYDRODIURIL) 25 MG tablet Take 25 mg by mouth daily.   Marland Kitchen losartan (COZAAR) 100 MG tablet Take 100 mg by mouth every morning.  . metoprolol succinate (TOPROL-XL) 25 MG 24 hr tablet metoprolol succinate ER 25 mg tablet,extended release 24 hr  TAKE 1 TABLET BY MOUTH ONCE DAILY  . mometasone (NASONEX) 50 MCG/ACT nasal spray 1-2 sprays each nostril daily at bedtime while needed (Patient taking differently: Place 1-2 sprays into the nose at bedtime as needed (allergies). )  . Omega 3 1000 MG CAPS Take 1 capsule by mouth.  . rosuvastatin (CRESTOR) 20 MG tablet Take 20 mg by mouth daily.   No facility-administered encounter medications on file as of 02/20/2018.      Review of Systems  Review of  Systems  Constitutional: Negative.   HENT: Negative.   Respiratory: Negative for cough, shortness of breath and wheezing.   Cardiovascular: Negative.   Gastrointestinal: Negative.   Allergic/Immunologic: Negative.   Neurological: Negative.   Psychiatric/Behavioral: Negative.        Physical Exam  BP 122/68 (BP Location: Left Arm, Patient Position: Sitting, Cuff Size: Normal)   Pulse 60   Ht 5' 6.5" (1.689 m)   Wt 200 lb 9.6 oz (91 kg)   SpO2 98%   BMI 31.89 kg/m   Wt Readings from Last 5 Encounters:  02/20/18 200 lb 9.6 oz (91 kg)  08/18/17 210 lb (95.3 kg)  04/02/17 206  lb (93.4 kg)  02/04/17 207 lb 3.2 oz (94 kg)  10/30/16 210 lb (95.3 kg)     Physical Exam  Constitutional: He is oriented to person, place, and time. He appears well-developed and well-nourished. No distress.  HENT:  MP 2 airway  Cardiovascular: Normal rate and regular rhythm.  Pulmonary/Chest: Effort normal and breath sounds normal.  Neurological: He is alert and oriented to person, place, and time.  Skin: Skin is warm and dry.  Psychiatric: He has a normal mood and affect.  Nursing note and vitals reviewed.      Assessment & Plan:   Obstructive sleep apnea Patient continues to benefit from BiPAP. He uses it every night and is comfortable. He sleeps much better with it and does not want to sleep with out it. We will replace hs old machine.   Patient Instructions  Will order new bipap and supplies continue current medications Do not drive if drowsy Goal to wear BIPAP for at least 4 hours per night Will need follow up with Dr. Annamaria Boots 1 month after starting new machine Call if there are any issues with machine  Per Dr. Janee Morn last note - Order- [facility]- please replace old BIPAP machine 12/8, mask of choice, humidifier, supplies, AirView     Chronic asthmatic bronchitis with acute exacerbation Patient has had better symptom control in recent years with no significant wheezing or cough and no acute exacerbations.   Allergic rhinitis due to pollen Continue nasonex as ordered     Fenton Foy, NP 02/20/2018

## 2018-02-20 NOTE — Assessment & Plan Note (Signed)
Continue nasonex as ordered

## 2018-02-27 ENCOUNTER — Telehealth: Payer: Self-pay | Admitting: Internal Medicine

## 2018-02-27 DIAGNOSIS — G4733 Obstructive sleep apnea (adult) (pediatric): Secondary | ICD-10-CM

## 2018-02-27 NOTE — Telephone Encounter (Signed)
Christopher Shepherd at [facility] needs Bi-pap titration study for Bi-pap order. Spoke with wife Christopher Shepherd per [place], wife is unaware of patient ever having a bi-pap titration study or when. Wife voiced she would have the patient call our office with more information however she states she would have remembered if he had one. No Bi-pap titration study on file.   CY please advise if you want patient to have a Bi-pap titration study?

## 2018-03-03 ENCOUNTER — Ambulatory Visit (INDEPENDENT_AMBULATORY_CARE_PROVIDER_SITE_OTHER): Payer: Medicare Other | Admitting: Podiatry

## 2018-03-03 DIAGNOSIS — M79672 Pain in left foot: Secondary | ICD-10-CM | POA: Diagnosis not present

## 2018-03-03 DIAGNOSIS — B351 Tinea unguium: Secondary | ICD-10-CM | POA: Diagnosis not present

## 2018-03-03 DIAGNOSIS — M79671 Pain in right foot: Secondary | ICD-10-CM

## 2018-03-03 NOTE — Telephone Encounter (Signed)
Christopher Shepherd's sleep study was in 2004.  We need to update documentation to see what treatment he qualifies for now. Please order a split night protocol NPSG for dx OSA

## 2018-03-03 NOTE — Patient Instructions (Signed)
Seen for hypertrophic nails. All nails debrided. Return in 3 months or as needed.  

## 2018-03-03 NOTE — Telephone Encounter (Signed)
Called Christopher Shepherd and let her know that we are placing the order for the split night study. Order placed. Nothing further needed at this time.

## 2018-03-04 ENCOUNTER — Encounter: Payer: Self-pay | Admitting: Podiatry

## 2018-03-04 NOTE — Progress Notes (Signed)
Subjective: 84 y.o. year old male patient presents requesting painful toe nails trimmed. He has had minor health issues and had to miss the last appointment.   Objective: Dermatologic: Thick dystrophic nails with fungal debris 1-5 bilateral. Vascular: Pedal pulses are all palpable. Orthopedic: Contracted lesser digits bilateral.  Neurologic: All epicritic and tactile sensations grossly intact.  Assessment: Dystrophic mycotic nails x 10. Painful toes.  Treatment: All mycotic nails debrided.  Return in 3 months or sooner if needed.

## 2018-04-06 ENCOUNTER — Ambulatory Visit (HOSPITAL_BASED_OUTPATIENT_CLINIC_OR_DEPARTMENT_OTHER): Payer: Medicare Other | Attending: Internal Medicine | Admitting: Internal Medicine

## 2018-04-06 VITALS — Ht 66.0 in | Wt 202.0 lb

## 2018-04-06 DIAGNOSIS — G4733 Obstructive sleep apnea (adult) (pediatric): Secondary | ICD-10-CM | POA: Insufficient documentation

## 2018-04-12 DIAGNOSIS — G4733 Obstructive sleep apnea (adult) (pediatric): Secondary | ICD-10-CM

## 2018-04-12 NOTE — Procedures (Signed)
Patient Name: Christopher Shepherd, [place] Date: 04/06/2018 Gender: Male D.O.B: 1927/04/25 Age (years): 89 Referring Provider: Baird Lyons MD, ABSM Height (inches): 66 Interpreting Physician: Baird Lyons MD, ABSM Weight (lbs): 202 RPSGT: Laren Everts BMI: 33 MRN: [id number] Neck Size: 15.50  CLINICAL INFORMATION Sleep Study Type: NPSG Indication for sleep study: Excessive Daytime Sleepiness, Hypertension, Obesity, OSA, Re-Evaluation, Sleep walking/talking/parasomnias, Witnessed Apneas  Epworth Sleepiness Score: 1  SLEEP STUDY TECHNIQUE As per the AASM Manual for the Scoring of Sleep and Associated Events v2.3 (April 2016) with a hypopnea requiring 4% desaturations.  The channels recorded and monitored were frontal, central and occipital EEG, electrooculogram (EOG), submentalis EMG (chin), nasal and oral airflow, thoracic and abdominal wall motion, anterior tibialis EMG, snore microphone, electrocardiogram, and pulse oximetry.  MEDICATIONS Medications self-administered by patient taken the night of the study : none reported  SLEEP ARCHITECTURE The study was initiated at 10:25:21 PM and ended at 5:06:59 AM.  Sleep onset time was 37.5 minutes and the sleep efficiency was 56.5%%. The total sleep time was 227 minutes.  Stage REM latency was 166.0 minutes.  The patient spent 29.3%% of the night in stage N1 sleep, 54.8%% in stage N2 sleep, 0.0%% in stage N3 and 15.9% in REM.  Alpha intrusion was absent.  Supine sleep was 81.06%.  RESPIRATORY PARAMETERS The overall apnea/hypopnea index (AHI) was 60.8 per hour. There were 170 total apneas, including 50 obstructive, 61 central and 59 mixed apneas. There were 60 hypopneas and 53 RERAs.  The AHI during Stage REM sleep was 8.3 per hour.  AHI while supine was 69.8 per hour.  The mean oxygen saturation was 96.5%. The minimum SpO2 during sleep was 91.0%.  moderate snoring was noted during this study.  CARDIAC DATA The 2 lead  EKG demonstrated sinus rhythm. The mean heart rate was 52.1 beats per minute. Other EKG findings include: PVCs.  LEG MOVEMENT DATA The total PLMS were 0 with a resulting PLMS index of 0.0. Associated arousal with leg movement index was 0.0 .  IMPRESSIONS - Severe obstructive sleep apnea occurred during this study (AHI = 60.8/h). - Sleep was markedly fragmented, with insufficient early sleep to meet protocol requirements for split CPAP titration. - Mild central sleep apnea occurred during this study (CAI = 16.1/h). - The patient had minimal or no oxygen desaturation during the study (Min O2 = 91.0%) - The patient snored with moderate snoring volume. - EKG findings include PVCs. - Sleep talking was noted. - Clinically significant periodic limb movements did not occur during sleep. No significant associated arousals.  DIAGNOSIS - Obstructive Sleep Apnea (327.23 [G47.33 ICD-10]) - Central Sleep Apnea (327.27 [G47.37 ICD-10])  RECOMMENDATIONS - CPAP titration to determine optimal pressure required to alleviate sleep disordered breathing. BiPAP or ASV titration may be required to eliminate central sleep apnea. - Positional therapy avoiding supine position during sleep. - Be careful with alcohol, sedatives and other CNS depressants that may worsen sleep apnea and disrupt normal sleep architecture. - Sleep hygiene should be reviewed to assess factors that may improve sleep quality. - Weight management and regular exercise should be initiated or continued if appropriate.  [Electronically signed] 04/12/2018 02:53 PM  Baird Lyons MD, [place], American Board of Sleep Medicine   NPI: [id number]                          [place], [place] [facility]  ELECTRONICALLY SIGNED ON:  04/12/2018, 2:50 PM [facility] PH: [phone]   FX: [phone] [place]

## 2018-04-16 ENCOUNTER — Ambulatory Visit (INDEPENDENT_AMBULATORY_CARE_PROVIDER_SITE_OTHER): Payer: Medicare Other

## 2018-04-16 DIAGNOSIS — Z23 Encounter for immunization: Secondary | ICD-10-CM | POA: Diagnosis not present

## 2018-05-06 ENCOUNTER — Telehealth: Payer: Self-pay | Admitting: Internal Medicine

## 2018-05-06 DIAGNOSIS — G4733 Obstructive sleep apnea (adult) (pediatric): Secondary | ICD-10-CM

## 2018-05-06 NOTE — Telephone Encounter (Signed)
Called patient unable to reach left message to give us a call back.

## 2018-05-08 NOTE — Telephone Encounter (Signed)
Attempted to contact pt. I did not receive an answer. I have left a message for pt to return our call.  

## 2018-05-12 NOTE — Telephone Encounter (Signed)
Attempted to contact pt. I did not receive an answer. I have left a message for pt to return our call.  

## 2018-05-13 NOTE — Telephone Encounter (Signed)
Spoke with pt. He is requesting his sleep study results.  CY - please advise. Thanks.

## 2018-05-14 ENCOUNTER — Telehealth: Payer: Self-pay | Admitting: Internal Medicine

## 2018-05-14 DIAGNOSIS — G4733 Obstructive sleep apnea (adult) (pediatric): Secondary | ICD-10-CM

## 2018-05-14 NOTE — Telephone Encounter (Signed)
Called pt and advised message from the provider. Pt understood and verbalized understanding. Nothing further is needed.   Order placed for new Bipap

## 2018-05-14 NOTE — Telephone Encounter (Signed)
I called [facility] but they are closed. Will call in the morning. In the meanwhile, can we order Bipap titration? Please advise.

## 2018-05-14 NOTE — Telephone Encounter (Signed)
Sleep study confirmed severe Complex Sleep Apnea mixed obstructive and central apneas.  Order- his DME- please replace old BIPAP machine 12/8, mask of choice, humidifier, supplies, AirView

## 2018-05-15 NOTE — Telephone Encounter (Signed)
Please schedule a CPAP/BIPAP titration sleep study

## 2018-05-15 NOTE — Telephone Encounter (Signed)
Called [facility] and spoke with Bethanne Ginger to clarify if it was a BIPAP titration or a CPAP titration that pt was needing to have performed.  Per Bethanne Ginger, pt is needing a BIPAP titration study.  Order has been placed for the BIPAP titration. Nothing further needed.

## 2018-05-25 ENCOUNTER — Telehealth: Payer: Self-pay | Admitting: Internal Medicine

## 2018-05-25 NOTE — Telephone Encounter (Signed)
From 11/6 phone note: Order placed for new Bipap    Young, Clinton D, MD  to [facility] Triage Pool      11:25 AM  Note    Sleep study confirmed severe Complex Sleep Apnea mixed obstructive and central apneas.  Order- his DME- please replace old BIPAP machine 12/8, mask of choice, humidifier, supplies, AirView     lmtcb for pt.  Per 11/6 phone note he was already made aware of results.

## 2018-05-26 ENCOUNTER — Encounter (HOSPITAL_BASED_OUTPATIENT_CLINIC_OR_DEPARTMENT_OTHER): Payer: Medicare Other

## 2018-05-27 NOTE — Telephone Encounter (Signed)
Called patient unable to reach left message to give us a call back.

## 2018-05-29 NOTE — Telephone Encounter (Signed)
Looks like pt cancelled the bipap study please explain to the patient that it is needed he can call the [phone]

## 2018-05-29 NOTE — Telephone Encounter (Signed)
Spoke with pt, advised himn that he would need the bipap titration study done first. Pt cancelled sleep test because he was confused about having another sleep test. I advised him that this was a different type of sleep test and that this test was a titration to figure out what settings his bipap should be on. Pt understood and stated he would call them back to reschedule. Nothing further is needed.

## 2018-05-29 NOTE — Telephone Encounter (Signed)
Called and spoke with pt who stated that he is still waiting for a new machine and supplies. Stated to pt we would see what was going on and get things taken care of for him. Pt expressed understanding.  From phone note 05/14/18 after speaking with Bethanne Ginger at [facility] who stated pt needed to have a BIPAP titration study performed, an order was placed for pt to have the BIPAP titration study.  Pt did receive the results of the HST 05/06/18 but looking in the procedures tab, it does not look like pt has had a BIPAP titration study performed.  I looked at the finalized requests tab and saw where the BIPAP titration study was cancelled. PCCs, can you please help [place] out with this. Thanks!

## 2018-06-03 ENCOUNTER — Ambulatory Visit: Payer: Medicare Other | Admitting: Podiatry

## 2018-08-20 ENCOUNTER — Encounter: Payer: Self-pay | Admitting: Internal Medicine

## 2018-08-20 ENCOUNTER — Ambulatory Visit (INDEPENDENT_AMBULATORY_CARE_PROVIDER_SITE_OTHER): Payer: Medicare Other | Admitting: Internal Medicine

## 2018-08-20 VITALS — BP 132/60 | HR 59 | Ht 66.0 in | Wt 207.2 lb

## 2018-08-20 DIAGNOSIS — G4733 Obstructive sleep apnea (adult) (pediatric): Secondary | ICD-10-CM | POA: Diagnosis not present

## 2018-08-20 DIAGNOSIS — J45998 Other asthma: Secondary | ICD-10-CM | POA: Diagnosis not present

## 2018-08-20 NOTE — Progress Notes (Signed)
Subjective:    Patient ID: Christopher Shepherd, male    DOB: 02-21-1927, 85 y.o.   MRN: [id number]  HPI male never smoker followed for cough, asthma, allergic rhinitis, obstructive sleep apnea  ---------------------------------------------------------------------------------------------  08/18/17- 85 year old male never smoker followed for cough, asthma, allergic rhinitis, OSA BiPAP 12/8/Advanced ----1 year ROV  No download available but he reports using BIPAP every night and depending on it because he sleeps better using it.  Pressure is comfortable. He has not been having wheeze cough or breathing discomfort and has had no acute respiratory infections this winter so far.  08/20/2018- 85 year old male never smoker followed for cough, asthma, allergic rhinitis, OSA BiPAP 12/8/Advanced NPSG 04/06/2018-AHI 60.8/hour, desaturation to 91%, body weight 202 pounds -----had sleep study 04/06/18 but has not received new cpap.  still using old cpap. recent swelling in feet but breathing about the same Body weight today 207 pounds We reviewed his sleep study.  He did not understand that he was being asked to return for a titration study. He continues to use his old BiPAP machine 12/8 with a new mask.    Review of Systems- see HPI + = positive Constitutional:   No-   weight loss, night sweats, fevers, chills, fatigue, lassitude. HEENT:   No-  headaches, difficulty swallowing, tooth/dental problems, sore throat,       No-  sneezing, itching, ear ache,    nasal congestion, +post nasal drip,  CV:  No-   chest pain, orthopnea, PND, swelling in lower extremities, anasarca,  dizziness, palpitations Resp: No-   shortness of breath with exertion or at rest.               productive cough,   non-productive cough,  No-  coughing up of blood.               change in color of mucus.   wheezing.   Skin: No-   rash or lesions. GI:  No-   heartburn, indigestion, abdominal pain, nausea, vomiting,  GU:  MS:  No-    joint pain or swelling.   Neuro- nothing unusual  Psych:  No- change in mood or affect. No depression or anxiety.  No memory loss.   Objective:   Physical Exam General- Alert, Oriented, Affect-appropriate, Distress- none acute; + Overweight Skin- rash-none, lesions- none, excoriation- none Lymphadenopathy- none Head- atraumatic            Eyes- Gross vision intact, PERRLA, conjunctivae clear secretions            Ears- Hearing, canals-normal            Nose- + turbinate edema, no-Septal dev, mucus, polyps, erosion, perforation             Throat- Mallampati IV , mucosa dry , drainage- none, tonsils- atrophic Neck- flexible , trachea midline, no stridor , thyroid nl, carotid no bruit Chest - symmetrical excursion , unlabored           Heart/CV- RRR with skipped beats , no murmur , no gallop  , no rub, nl s1 s2                           - JVD- none , edema+trace, stasis changes- none, varices- none           Lung- clear, wheeze- none, cough-none , dullness-none, rub- none           Chest wall-  Abd-  Br/  Gen/ Rectal- Not done, not indicated Extrem- cyanosis- none, clubbing, none, atrophy- none, strength- nl Neuro- grossly intact to observation Assessment & Plan:

## 2018-08-20 NOTE — Patient Instructions (Addendum)
Order- DME [facility]- please replace old BIPAP machine auto 5-15/ 5-15, ps 3, mask of choice, humidifier, supplies, AirView   Please call as needed

## 2018-08-21 ENCOUNTER — Telehealth: Payer: Self-pay | Admitting: Internal Medicine

## 2018-08-21 NOTE — Telephone Encounter (Signed)
Spoke with Joellen Jersey who verified that the order for replacement bipap is to go to [facility] and not [facility].  Spoke with Sonia Baller at [facility] to make aware of this.  Nothing further needed at this time- will close encounter.

## 2018-08-23 NOTE — Assessment & Plan Note (Addendum)
He needs a new machine.  It would be best if his DME company could replace his old BiPAP machine with auto BiPAP 5-15/ 5-15.  If necessary we will try CPAP auto 5-15.

## 2018-08-23 NOTE — Assessment & Plan Note (Signed)
Currently quite stable without recent exacerbation.  He feels comfortable with his breathing.

## 2018-09-02 ENCOUNTER — Telehealth: Payer: Self-pay | Admitting: Internal Medicine

## 2018-09-02 NOTE — Telephone Encounter (Signed)
Patient came to office requesting a copy of his sleep results be sent to [facility], fax number [phone]. Patient stated that he had sleep study 03/2018. Patinet stated that [facility] told him that was all they were waiting on, for Patient to receive new bipap. Patient sleep study faxed to [facility].  Confirmation received.  Nothing further at this time.

## 2018-09-04 ENCOUNTER — Telehealth: Payer: Self-pay | Admitting: Internal Medicine

## 2018-09-04 DIAGNOSIS — G4733 Obstructive sleep apnea (adult) (pediatric): Secondary | ICD-10-CM

## 2018-09-04 NOTE — Telephone Encounter (Signed)
No- I am trying to save him another sleep study. He had an updated sleep study in October confirming the diagnosis. He needs a new machine mask and supplies. He doesn't need BiPap.

## 2018-09-04 NOTE — Telephone Encounter (Signed)
Order- DME [facility]   Please dc order for BIPAP. Change to order CPAP auto 10-20, mask of choice, humidifier, supplies, AirView/ card    Dx OSA

## 2018-09-04 NOTE — Telephone Encounter (Signed)
Called and spoke with Christopher Shepherd she stated that they received the sleep study but the patient still needs to have a sleep study cpap titration.    CY please advise, thank you.

## 2018-09-04 NOTE — Telephone Encounter (Signed)
Dr. Annamaria Boots, I think they are asking to order a cpap tiration. They are unable to give him supplies without one. Ok to order?

## 2018-09-04 NOTE — Telephone Encounter (Signed)
Called Gilda with [facility]. Unable to reach as office is closed. Will call back.

## 2018-09-07 NOTE — Telephone Encounter (Signed)
I called Bethanne Ginger but she was off today and the person who answered the phone stated she was unable to look him up in the system. She is going to have Gilda call us tomorrow to follow. Will await return call.

## 2018-09-08 NOTE — Telephone Encounter (Signed)
Called [facility] and left message for Bethanne Ginger to give [place] a call back.

## 2018-09-09 NOTE — Telephone Encounter (Signed)
Called Christopher Shepherd, Christopher Shepherd is not available today.  Spoke with Christopher Shepherd.  Christopher Shepherd stated that patient was suppose to have a spilt night test originally, but did not qualify.  Dr Janee Morn recommendations given. Christopher Shepherd stated that Patient needed a cpap titration for cpap and supplies.  Message routed to Dr Annamaria Boots

## 2018-09-09 NOTE — Telephone Encounter (Signed)
Please explain to Christopher Shepherd that his insurance and the DME company say he needs to have a CPAP titration sleep study at the sleep center in order to replace his machine.  Please order in-patient CPAP titration sleep study for dx OSA

## 2018-09-09 NOTE — Telephone Encounter (Signed)
Called pt and advised message from the provider. Pt understood and verbalized understanding. Nothing further is needed.   Pt agreed to do CPAP titration. Order was placed.

## 2018-10-28 ENCOUNTER — Encounter (HOSPITAL_BASED_OUTPATIENT_CLINIC_OR_DEPARTMENT_OTHER): Payer: Medicare Other

## 2019-01-11 ENCOUNTER — Telehealth: Payer: Self-pay | Admitting: Internal Medicine

## 2019-01-11 NOTE — Telephone Encounter (Signed)
ATC Kia from [facility], line went to voicemail. I have left a message for Kia to call back X1.

## 2019-01-12 ENCOUNTER — Other Ambulatory Visit (HOSPITAL_COMMUNITY)
Admission: RE | Admit: 2019-01-12 | Discharge: 2019-01-12 | Disposition: A | Discharge status: Home / Self Care | Payer: Medicare Other | Source: Ambulatory Visit | Attending: Internal Medicine | Admitting: Internal Medicine

## 2019-01-12 DIAGNOSIS — Z1159 Encounter for screening for other viral diseases: Secondary | ICD-10-CM | POA: Diagnosis present

## 2019-01-12 NOTE — Telephone Encounter (Signed)
Kia is calling back [phone]

## 2019-01-12 NOTE — Telephone Encounter (Signed)
LMOM TCB x1 for Christopher Shepherd

## 2019-01-12 NOTE — Telephone Encounter (Signed)
Sent a skype message to Kia with the sleep center since we have not been able to catch each other on the phone:  [01/12/2019 2:21 PM]   Hi Kia - I'm with [facility].  Did you call the office on Larena Glassman?   [01/12/2019 2:26 PM]  Cyndi Bender, Kia:   Hi. Yes. We were needing order clarification on his sleep study, but Dr. Annamaria Boots actually is in the sleep lab now and we ran it by him, so we're good. Thank you for getting back to me.    Nothing further needed at this time; will sign off.

## 2019-01-12 NOTE — Telephone Encounter (Signed)
Christopher Shepherd is calling back [phone]

## 2019-01-12 NOTE — Telephone Encounter (Signed)
LMTCB x2 for Kia at the Sleep Center.

## 2019-01-13 LAB — SARS CORONAVIRUS 2 (TAT 6-24 HRS): SARS Coronavirus 2: NEGATIVE

## 2019-01-15 ENCOUNTER — Other Ambulatory Visit: Payer: Self-pay

## 2019-01-15 ENCOUNTER — Ambulatory Visit (HOSPITAL_BASED_OUTPATIENT_CLINIC_OR_DEPARTMENT_OTHER): Payer: Medicare Other | Attending: Internal Medicine | Admitting: Internal Medicine

## 2019-01-15 DIAGNOSIS — G4731 Primary central sleep apnea: Secondary | ICD-10-CM | POA: Insufficient documentation

## 2019-01-15 DIAGNOSIS — G4733 Obstructive sleep apnea (adult) (pediatric): Secondary | ICD-10-CM | POA: Insufficient documentation

## 2019-01-18 ENCOUNTER — Other Ambulatory Visit: Payer: Self-pay

## 2019-01-20 DIAGNOSIS — G4733 Obstructive sleep apnea (adult) (pediatric): Secondary | ICD-10-CM

## 2019-01-20 NOTE — Procedures (Signed)
Patient Name: Christopher Shepherd, Christopher Shepherd Date: 01/15/2019 Gender: Male D.O.B: 1926/08/17 Age (years): 81 Referring Provider: Baird Lyons MD, ABSM Height (inches): 66 Interpreting Physician: Baird Lyons MD, ABSM Weight (lbs): 201 RPSGT: Baxter Flattery BMI: 32 MRN: [id number] Neck Size: 16.00  CLINICAL INFORMATION The patient is referred for a CPAP titration to treat sleep apnea.  Date of NPSG, Split Night or HST:  NPSG 04/06/18   AHI 60.8/ hr, desaturation to 91%, body weight 202 lbs  SLEEP STUDY TECHNIQUE As per the AASM Manual for the Scoring of Sleep and Associated Events v2.3 (April 2016) with a hypopnea requiring 4% desaturations.  The channels recorded and monitored were frontal, central and occipital EEG, electrooculogram (EOG), submentalis EMG (chin), nasal and oral airflow, thoracic and abdominal wall motion, anterior tibialis EMG, snore microphone, electrocardiogram, and pulse oximetry. Continuous positive airway pressure (CPAP) was initiated at the beginning of the study and titrated to treat sleep-disordered breathing.  MEDICATIONS Medications self-administered by patient taken the night of the study : none reported  TECHNICIAN COMMENTS Comments added by technician: Patient had difficulty initiating sleep. SIMPLUS FULL FACE MASK Comments added by scorer: N/A  RESPIRATORY PARAMETERS Optimal PAP Pressure (cm): 14 AHI at Optimal Pressure (/hr): 2.2 Overall Minimal O2 (%): 93.0 Supine % at Optimal Pressure (%): 0 Minimal O2 at Optimal Pressure (%): 94.0   SLEEP ARCHITECTURE The study was initiated at 10:35:53 PM and ended at 4:34:45 AM.  Sleep onset time was 85.5 minutes and the sleep efficiency was 38.9%%. The total sleep time was 139.5 minutes.  The patient spent 6.8%% of the night in stage N1 sleep, 92.8%% in stage N2 sleep, 0.0%% in stage N3 and 0.4% in REM.Stage REM latency was 187.5 minutes  Wake after sleep onset was 133.9. Alpha intrusion was absent.  Supine sleep was 19.00%.  CARDIAC DATA The 2 lead EKG demonstrated sinus rhythm. The mean heart rate was 50.1 beats per minute. Other EKG findings include: None.  LEG MOVEMENT DATA The total Periodic Limb Movements of Sleep (PLMS) were 0. The PLMS index was 0.0. A PLMS index of <15 is considered normal in adults.  IMPRESSIONS - The optimal PAP pressure was 14 cm of water. - Mild Central Sleep Apnea was noted during this titration (CAI = 5.2/h). - Significant oxygen desaturations were not observed during this titration (min O2 = 93.0%). - No snoring was audible during this study. - No cardiac abnormalities were observed during this study. - Clinically significant periodic limb movements were not noted during this study. Arousals associated with PLMs were rare.  DIAGNOSIS - Obstructive Sleep Apnea (327.23 [G47.33 ICD-10])  RECOMMENDATIONS - Trial of CPAP therapy on 14 cm H2O or autopap 10-20. - Patient used a Large size Fisher&Paykel Full Face Mask Simplus mask and heated humidification. - Be careful with alcohol, sedatives and other CNS depressants that may worsen sleep apnea and disrupt normal sleep architecture. - Sleep hygiene should be reviewed to assess factors that may improve sleep quality. - Weight management and regular exercise should be initiated or continued. -   [Electronically signed] 01/20/2019 02:04 PM  Baird Lyons MD, [place], American Board of Sleep Medicine   NPI: [id number]                        [place], [facility]  ELECTRONICALLY SIGNED ON:  01/20/2019, 2:00 PM [place] PH: [phone]   FX: [phone] ACCREDITED  BY THE AMERICAN ACADEMY OF SLEEP MEDICINE

## 2019-01-22 ENCOUNTER — Telehealth: Payer: Self-pay | Admitting: Internal Medicine

## 2019-01-22 DIAGNOSIS — G4733 Obstructive sleep apnea (adult) (pediatric): Secondary | ICD-10-CM

## 2019-01-22 NOTE — Telephone Encounter (Signed)
Called and spoke with pt. Pt is requesting to know if the results of the sleep study have come back as pt said he is needing a new machine.  Dr. Annamaria Boots, please advise on this for pt. Thanks!

## 2019-01-22 NOTE — Telephone Encounter (Signed)
His CPAP titration study showed good control with CPAP 14. He did not need BIPAP or supplemental oxygen.  Recommend order DME replace old PAP machine, change to autopap 10-20, mask of choice, humidifier, supplies, AirView/ card.

## 2019-01-22 NOTE — Telephone Encounter (Signed)
Called and spoke with pt letting him know the results of the sleep study and stated to him that we were going to place order for new machine. Pt verbalized understanding. Verified pt's DME and placed order. Nothing further needed.

## 2019-02-05 ENCOUNTER — Telehealth: Payer: Self-pay | Admitting: Internal Medicine

## 2019-02-05 NOTE — Telephone Encounter (Signed)
ATC [facility] to follow up on this order, was on a long hold with no one coming to the line. Will try back.

## 2019-02-08 NOTE — Telephone Encounter (Signed)
[facility], can you look into this for [place] please.

## 2019-02-08 NOTE — Telephone Encounter (Signed)
Called and spoke to [place] she stated that this order was in the repap dept and that they are processing it I have called and left a message for the patient

## 2019-03-02 ENCOUNTER — Encounter: Payer: Self-pay | Admitting: Podiatry

## 2019-03-02 ENCOUNTER — Ambulatory Visit (INDEPENDENT_AMBULATORY_CARE_PROVIDER_SITE_OTHER): Payer: Medicare Other | Admitting: Podiatry

## 2019-03-02 ENCOUNTER — Other Ambulatory Visit: Payer: Self-pay

## 2019-03-02 VITALS — BP 152/83 | HR 67

## 2019-03-02 DIAGNOSIS — B351 Tinea unguium: Secondary | ICD-10-CM

## 2019-03-02 DIAGNOSIS — M79676 Pain in unspecified toe(s): Secondary | ICD-10-CM

## 2019-03-02 NOTE — Progress Notes (Signed)
Complaint:  Visit Type: Patient returns to my office for continued preventative foot care services. Complaint: Patient states" my nails have grown long and thick and become painful to walk and wear shoes" . The patient presents for preventative foot care services. No changes to ROS.  Patient has not been seen for his nails in over one year.  Podiatric Exam: Vascular: dorsalis pedis and posterior tibial pulses are weakly  palpable bilateral. Capillary return is immediate. Temperature gradient is WNL. Skin turgor WNL  Sensorium: Normal Semmes Weinstein monofilament test. Normal tactile sensation bilaterally. Nail Exam: Pt has thick disfigured discolored nails with subungual debris noted bilateral entire nail hallux through fifth toenails Ulcer Exam: There is no evidence of ulcer or pre-ulcerative changes or infection. Orthopedic Exam: Muscle tone and strength are WNL. No limitations in general ROM. No crepitus or effusions noted. Foot type and digits show no abnormalities. Bony prominences are unremarkable. Skin: No Porokeratosis. No infection or ulcers  Diagnosis:  Onychomycosis, , Pain in right toe, pain in left toes  Treatment & Plan Procedures and Treatment: Consent by patient was obtained for treatment procedures.   Debridement of mycotic and hypertrophic toenails, 1 through 5 bilateral and clearing of subungual debris. No ulceration, no infection noted.  Return Visit-Office Procedure: Patient instructed to return to the office for a follow up visit 3 months for continued evaluation and treatment.    Gardiner Barefoot DPM

## 2019-04-12 ENCOUNTER — Telehealth: Payer: Self-pay | Admitting: Internal Medicine

## 2019-04-12 NOTE — Telephone Encounter (Signed)
Called and spoke with Patient. Patient scheduled for high dose flu vaccine 04/14/19 at 2pm.  Nothing further at this time.

## 2019-04-14 ENCOUNTER — Other Ambulatory Visit: Payer: Self-pay

## 2019-04-14 ENCOUNTER — Ambulatory Visit (INDEPENDENT_AMBULATORY_CARE_PROVIDER_SITE_OTHER): Payer: Medicare Other

## 2019-04-14 DIAGNOSIS — Z23 Encounter for immunization: Secondary | ICD-10-CM

## 2019-06-02 ENCOUNTER — Ambulatory Visit (INDEPENDENT_AMBULATORY_CARE_PROVIDER_SITE_OTHER): Payer: Medicare Other | Admitting: Podiatry

## 2019-06-02 ENCOUNTER — Other Ambulatory Visit: Payer: Self-pay

## 2019-06-02 ENCOUNTER — Encounter: Payer: Self-pay | Admitting: Podiatry

## 2019-06-02 DIAGNOSIS — B351 Tinea unguium: Secondary | ICD-10-CM | POA: Diagnosis not present

## 2019-06-02 NOTE — Progress Notes (Signed)
Complaint:  Visit Type: Patient returns to my office for continued preventative foot care services. Complaint: Patient states" my nails have grown long and thick and become painful to walk and wear shoes" . The patient presents for preventative foot care services. No changes to ROS.    Podiatric Exam: Vascular: dorsalis pedis and posterior tibial pulses are weakly  palpable bilateral. Capillary return is immediate. Temperature gradient is WNL. Skin turgor WNL  Sensorium: Normal Semmes Weinstein monofilament test. Normal tactile sensation bilaterally. Nail Exam: Pt has thick disfigured discolored nails with subungual debris noted bilateral entire nail hallux through fifth toenails Ulcer Exam: There is no evidence of ulcer or pre-ulcerative changes or infection. Orthopedic Exam: Muscle tone and strength are WNL. No limitations in general ROM. No crepitus or effusions noted. Foot type and digits show no abnormalities. Bony prominences are unremarkable. Skin: No Porokeratosis. No infection or ulcers  Diagnosis:  Onychomycosis, , Pain in right toe, pain in left toes  Treatment & Plan Procedures and Treatment: Consent by patient was obtained for treatment procedures.   Debridement of mycotic and hypertrophic toenails, 1 through 5 bilateral and clearing of subungual debris. No ulceration, no infection noted.  Return Visit-Office Procedure: Patient instructed to return to the office for a follow up visit 3 months for continued evaluation and treatment.    Gardiner Barefoot DPM

## 2019-09-01 ENCOUNTER — Ambulatory Visit: Payer: Medicare Other | Admitting: Podiatry

## 2019-10-09 ENCOUNTER — Emergency Department (HOSPITAL_COMMUNITY): Payer: Medicare Other

## 2019-10-09 ENCOUNTER — Emergency Department (HOSPITAL_COMMUNITY)
Admission: EM | Admit: 2019-10-09 | Discharge: 2019-10-09 | Disposition: A | Discharge status: Home / Self Care | Payer: Medicare Other | Attending: Emergency Medicine | Admitting: Emergency Medicine

## 2019-10-09 ENCOUNTER — Other Ambulatory Visit: Payer: Self-pay

## 2019-10-09 ENCOUNTER — Encounter (HOSPITAL_COMMUNITY): Payer: Self-pay | Admitting: *Deleted

## 2019-10-09 DIAGNOSIS — R531 Weakness: Secondary | ICD-10-CM | POA: Diagnosis not present

## 2019-10-09 DIAGNOSIS — Z79899 Other long term (current) drug therapy: Secondary | ICD-10-CM | POA: Diagnosis not present

## 2019-10-09 DIAGNOSIS — I10 Essential (primary) hypertension: Secondary | ICD-10-CM | POA: Diagnosis not present

## 2019-10-09 DIAGNOSIS — R42 Dizziness and giddiness: Secondary | ICD-10-CM | POA: Diagnosis present

## 2019-10-09 DIAGNOSIS — N183 Chronic kidney disease, stage 3 unspecified: Secondary | ICD-10-CM

## 2019-10-09 LAB — BASIC METABOLIC PANEL
Anion gap: 9 (ref 5–15)
BUN: 19 mg/dL (ref 8–23)
CO2: 25 mmol/L (ref 22–32)
Calcium: 8.8 mg/dL — ABNORMAL LOW (ref 8.9–10.3)
Chloride: 108 mmol/L (ref 98–111)
Creatinine, Ser: 1.65 mg/dL — ABNORMAL HIGH (ref 0.61–1.24)
GFR calc Af Amer: 42 mL/min — ABNORMAL LOW (ref >60)
GFR calc non Af Amer: 36 mL/min — ABNORMAL LOW (ref >60)
Glucose, Bld: 112 mg/dL — ABNORMAL HIGH (ref 70–99)
Potassium: 4 mmol/L (ref 3.5–5.1)
Sodium: 142 mmol/L (ref 135–145)

## 2019-10-09 LAB — TROPONIN I (HIGH SENSITIVITY): Troponin I (High Sensitivity): 11 ng/L (ref <18)

## 2019-10-09 LAB — URINALYSIS, ROUTINE W REFLEX MICROSCOPIC
Bilirubin Urine: NEGATIVE
Glucose, UA: NEGATIVE mg/dL
Hgb urine dipstick: NEGATIVE
Ketones, ur: NEGATIVE mg/dL
Leukocytes,Ua: NEGATIVE
Nitrite: NEGATIVE
Protein, ur: NEGATIVE mg/dL
Specific Gravity, Urine: 1.016 (ref 1.005–1.030)
pH: 6 (ref 5.0–8.0)

## 2019-10-09 LAB — CBC
HCT: 37.6 % — ABNORMAL LOW (ref 39.0–52.0)
Hemoglobin: 11.9 g/dL — ABNORMAL LOW (ref 13.0–17.0)
MCH: 28 pg (ref 26.0–34.0)
MCHC: 31.6 g/dL (ref 30.0–36.0)
MCV: 88.5 fL (ref 80.0–100.0)
Platelets: 149 10*3/uL — ABNORMAL LOW (ref 150–400)
RBC: 4.25 MIL/uL (ref 4.22–5.81)
RDW: 13.5 % (ref 11.5–15.5)
WBC: 4.9 10*3/uL (ref 4.0–10.5)
nRBC: 0 % (ref 0.0–0.2)

## 2019-10-09 MED ORDER — SODIUM CHLORIDE 0.9% FLUSH: 3.0000 mL | Freq: Once | INTRAVENOUS | Status: DC

## 2019-10-09 NOTE — ED Notes (Signed)
Pt. Made aware for the need of urine specimen. 

## 2019-10-09 NOTE — ED Provider Notes (Signed)
[facility] DEPT Provider Note   CSN: [id number] Arrival date & time: 10/09/19  1937     History Chief Complaint  Patient presents with  . Dizziness    Christopher Shepherd is a 86 y.o. male.  Patient c/o mild general weakness, and concerned that his home bp was reading in low 100's, stating his normal bp is in 130-140 range. Symptoms gradual onset in past couple days, mild, recurrent. Pt has list of bps and most in SBP 120-140 range although a few readings in the low 100s. Denies syncope or fall. No room spinning or vertigo. No recent change in meds. Normal appetite. No wt change. Denies headache. No cp. No sob. No cough uri symptoms. No fever/chills. Denies abd pain or nvd. No dysuria or gu c/o.   The history is provided by the patient.  Dizziness Associated symptoms: no chest pain, no headaches, no palpitations, no shortness of breath and no vomiting        Past Medical History:  Diagnosis Date  . Allergic rhinitis   . Anemia   . Arthritis    osteoarthritis  . Asthma 1994   with bronchitis  . Cough   . Hypertension   . Obstructive sleep apnea    uses a C-Pap    Patient Active Problem List   Diagnosis Date Noted  . Chronic asthmatic bronchitis with acute exacerbation ([place]) 07/16/2014  . Pain in lower limb 11/24/2013  . S/P total knee arthroplasty 10/23/2013  . Hyperlipidemia 10/08/2013  . Constipation 10/08/2013  . Essential hypertension, benign 10/08/2013  . Hiccups 10/08/2013  . Acute blood loss anemia 10/06/2013  . OA (osteoarthritis) of knee 10/04/2013  . Onychomycosis 10/30/2012  . Pain in joint, ankle and foot 10/30/2012  . Allergic rhinitis due to pollen 09/05/2010  . Obstructive sleep apnea 04/16/2007  . Allergic-infective asthma 04/16/2007  . COUGH 04/16/2007    Past Surgical History:  Procedure Laterality Date  . COLONOSCOPY W/ POLYPECTOMY    . none    . TOTAL KNEE ARTHROPLASTY Right 10/04/2013   Procedure: RIGHT TOTAL  KNEE ARTHROPLASTY;  Surgeon: Gearlean Alf, MD;  Location: [facility];  Service: Orthopedics;  Laterality: Right;       Family History  Problem Relation Age of Onset  . Cancer Father        cause of death  . Colon cancer Mother        cause of death    Social History   Tobacco Use  . Smoking status: Former Smoker    Packs/day: 0.20    Years: 15.00    Pack years: 3.00    Types: Cigarettes    Quit date: 07/01/1968    Years since quitting: 51.3  . Smokeless tobacco: Former Systems developer  . Tobacco comment: Quit 50 yrs ago as of 2014  Substance Use Topics  . Alcohol use: No    Alcohol/week: 3.0 standard drinks    Types: 3 Cans of beer per week    Comment: occasionally  . Drug use: No    Home Medications Prior to Admission medications   Medication Sig Start Date End Date Taking? Authorizing Provider  acetaminophen (TYLENOL 8 HOUR ARTHRITIS PAIN) 650 MG CR tablet Take 650 mg by mouth every 8 (eight) hours as needed for pain.    [provider]  amLODipine (NORVASC) 10 MG tablet Take 10 mg by mouth daily.    [provider]  amoxicillin (AMOXIL) 500 MG capsule TAKE 1 CAPSULE BY MOUTH  EVERY [place] 02/16/19   [provider]  Cholecalciferol (VITAMIN D-3) 1000 units CAPS cholecalciferol (vitamin D3) 2,000 unit tablet  1 tablet orally once a day    [provider]  Coenzyme Q10 (CO Q 10 PO) Co Q-10 300 mg capsule  Take 1 capsule every day by oral route.    [provider]  diclofenac sodium (VOLTAREN) 1 % GEL diclofenac 1 % topical gel  APPLY 2 GRAM TO THE AFFECTED AREA(S) BY TOPICAL ROUTE 4 TIMES PER DAY    [provider]  losartan (COZAAR) 100 MG tablet Take 100 mg by mouth every morning.    [provider]  metoprolol succinate (TOPROL-XL) 25 MG 24 hr tablet metoprolol succinate ER 25 mg tablet,extended release 24 hr  TAKE 1 TABLET BY MOUTH ONCE DAILY    [provider]  mometasone (NASONEX) 50 MCG/ACT  nasal spray 1-2 sprays each nostril daily at bedtime while needed Patient taking differently: Place 1-2 sprays into the nose at bedtime as needed (allergies).  08/12/16   Baird Lyons D, MD  Omega 3 1000 MG CAPS Take 1 capsule by mouth.    [provider]  rosuvastatin (CRESTOR) 20 MG tablet Take 20 mg by mouth daily.    [provider]    Allergies    Patient has no known allergies.  Review of Systems   Review of Systems  Constitutional: Negative for fever.  HENT: Negative for sore throat.   Eyes: Negative for visual disturbance.  Respiratory: Negative for cough and shortness of breath.   Cardiovascular: Negative for chest pain, palpitations and leg swelling.  Gastrointestinal: Negative for abdominal pain and vomiting.  Endocrine: Negative for polyuria.  Genitourinary: Negative for dysuria and flank pain.  Musculoskeletal: Negative for back pain and neck pain.  Skin: Negative for rash.  Neurological: Positive for light-headedness. Negative for syncope, speech difficulty, numbness and headaches.  Hematological: Does not bruise/bleed easily.  Psychiatric/Behavioral: Negative for confusion.    Physical Exam Updated Vital Signs BP (!) 168/74 (BP Location: Left Arm)   Pulse 61   Temp 98.4 F (36.9 C) (Oral)   Resp 18   Ht 1.727 m (5\' 8" )   Wt 87.1 kg   SpO2 99%   BMI 29.19 kg/m   Physical Exam Vitals and nursing note reviewed.  Constitutional:      Appearance: Normal appearance. He is well-developed.  HENT:     Head: Atraumatic.     Nose: Nose normal.     Mouth/Throat:     Mouth: Mucous membranes are moist.     Pharynx: Oropharynx is clear.  Eyes:     General: No scleral icterus.    Conjunctiva/sclera: Conjunctivae normal.     Pupils: Pupils are equal, round, and reactive to light.  Neck:     Vascular: No carotid bruit.     Trachea: No tracheal deviation.     Comments: Thyroid not grossly enlarged or tender. No bruits.  Cardiovascular:     Rate  and Rhythm: Normal rate and regular rhythm.     Pulses: Normal pulses.     Heart sounds: Normal heart sounds. No murmur. No friction rub. No gallop.   Pulmonary:     Effort: Pulmonary effort is normal. No accessory muscle usage or respiratory distress.     Breath sounds: Normal breath sounds.  Abdominal:     General: Bowel sounds are normal. There is no distension.     Palpations: Abdomen is soft.  There is no mass.     Tenderness: There is no abdominal tenderness. There is no guarding or rebound.     Hernia: No hernia is present.     Comments: No pulsatile mass.   Genitourinary:    Comments: No cva tenderness. Musculoskeletal:     Cervical back: Normal range of motion and neck supple. No rigidity.     Comments: Trace symmetric bil ankle edema.   Skin:    General: Skin is warm and dry.     Findings: No rash.  Neurological:     Mental Status: He is alert.     Comments: Alert, speech clear. Motor/sens grossly intact bil. Steady gait.   Psychiatric:        Mood and Affect: Mood normal.     ED Results / Procedures / Treatments   Labs (all labs ordered are listed, but only abnormal results are displayed) Results for orders placed or performed during the hospital encounter of 11/94/17  Basic metabolic panel  Result Value Ref Range   Sodium 142 135 - 145 mmol/L   Potassium 4.0 3.5 - 5.1 mmol/L   Chloride 108 98 - 111 mmol/L   CO2 25 22 - 32 mmol/L   Glucose, Bld 112 (H) 70 - 99 mg/dL   BUN 19 8 - 23 mg/dL   Creatinine, Ser 1.65 (H) 0.61 - 1.24 mg/dL   Calcium 8.8 (L) 8.9 - 10.3 mg/dL   GFR calc non Af Amer 36 (L) >60 mL/min   GFR calc Af Amer 42 (L) >60 mL/min   Anion gap 9 5 - 15  CBC  Result Value Ref Range   WBC 4.9 4.0 - 10.5 K/uL   RBC 4.25 4.22 - 5.81 MIL/uL   Hemoglobin 11.9 (L) 13.0 - 17.0 g/dL   HCT 37.6 (L) 39.0 - 52.0 %   MCV 88.5 80.0 - 100.0 fL   MCH 28.0 26.0 - 34.0 pg   MCHC 31.6 30.0 - 36.0 g/dL   RDW 13.5 11.5 - 15.5 %   Platelets 149 (L) 150 - 400 K/uL    nRBC 0.0 0.0 - 0.2 %  Urinalysis, Routine w reflex microscopic  Result Value Ref Range   Color, Urine YELLOW YELLOW   APPearance CLEAR CLEAR   Specific Gravity, Urine 1.016 1.005 - 1.030   pH 6.0 5.0 - 8.0   Glucose, UA NEGATIVE NEGATIVE mg/dL   Hgb urine dipstick NEGATIVE NEGATIVE   Bilirubin Urine NEGATIVE NEGATIVE   Ketones, ur NEGATIVE NEGATIVE mg/dL   Protein, ur NEGATIVE NEGATIVE mg/dL   Nitrite NEGATIVE NEGATIVE   Leukocytes,Ua NEGATIVE NEGATIVE  Troponin I (High Sensitivity)  Result Value Ref Range   Troponin I (High Sensitivity) 11 <18 ng/L   DG Chest 2 View  Result Date: 10/09/2019 CLINICAL DATA:  Chest pain. EXAM: CHEST - 2 VIEW COMPARISON:  Chest x-ray January 24, 2016 FINDINGS: The heart, hila, and mediastinum are unchanged and unremarkable. No pneumothorax. No nodules or masses. No focal infiltrates. Mild atelectasis in the left base. Rounded regions of sclerosis projected over the right scapular stable since 2017 of no significance. Degenerative changes are seen in the right shoulder. IMPRESSION: No acute abnormalities are identified to explain the patient's chest pain. No edema. Electronically Signed   By: Dorise Bullion III M.D   On: 10/09/2019 20:22    EKG EKG Interpretation  Date/Time:  Saturday October 09 2019 19:59:20 EDT Ventricular Rate:  67 PR Interval:    QRS Duration: 102 QT Interval:  432 QTC Calculation: 457 R Axis:   7 Text Interpretation: Sinus rhythm Nonspecific T wave abnormality Confirmed by Lajean Saver [phone]) on 10/09/2019 8:15:12 PM   Radiology DG Chest 2 View  Result Date: 10/09/2019 CLINICAL DATA:  Chest pain. EXAM: CHEST - 2 VIEW COMPARISON:  Chest x-ray January 24, 2016 FINDINGS: The heart, hila, and mediastinum are unchanged and unremarkable. No pneumothorax. No nodules or masses. No focal infiltrates. Mild atelectasis in the left base. Rounded regions of sclerosis projected over the right scapular stable since 2017 of no significance.  Degenerative changes are seen in the right shoulder. IMPRESSION: No acute abnormalities are identified to explain the patient's chest pain. No edema. Electronically Signed   By: Dorise Bullion III M.D   On: 10/09/2019 20:22    Procedures Procedures (including critical care time)  Medications Ordered in ED Medications  sodium chloride flush (NS) 0.9 % injection 3 mL (3 mLs Intravenous Not Given 10/09/19 2202)    ED Course  I have reviewed the triage vital signs and the nursing notes.  Pertinent labs & imaging results that were available during my care of the patient were reviewed by me and considered in my medical decision making (see chart for details).    MDM Rules/Calculators/A&P                      Iv ns. Ecg. Stat labs. Cxr. Continuous pulse ox and monitoring.   Reviewed nursing notes and prior charts for additional history.   Labs reviewed/interpreted by me - chem normal. Wbc normal. hgb 11.9. patients mildly elev cr, and hgb 11.9 are both consistent with his baseline.   CXR reviewed/interpreted by me - no pna.   Patients symptoms for past couple days - trop is normal - patient denies to me any chest pain or discomfort, and with normal trop after 1-2 days symptoms, felt not c/w acs.  Po fluids/food. Ambulate in ED. Orthostatic vitals.   Additional labs reviewed/interpreted by me - ua neg.   Patient on recheck is asymptomatic. No bp readings in ED low or soft.   Patient currently appears stable for d/c.   Return precautions provided.   MDM Number of Diagnoses or Management Options   Amount and/or Complexity of Data Reviewed Clinical lab tests: ordered and reviewed Tests in the radiology section of CPT: ordered and reviewed Decide to obtain previous medical records or to obtain history from someone other than the patient: yes Review and summarize past medical records: yes Independent visualization of images, tracings, or specimens: yes  Risk of Complications,  Morbidity, and/or Mortality Presenting problems: high Diagnostic procedures: high Management options: high       Final Clinical Impression(s) / ED Diagnoses Final diagnoses:  None    Rx / DC Orders ED Discharge Orders    None       Lajean Saver, MD 10/09/19 2326

## 2019-10-09 NOTE — ED Triage Notes (Signed)
Patient reporting that he has had swelling in his feet, intermittent chest pain, dizziness, and blood pressure has been lower today than normal (100's/50's)

## 2019-10-09 NOTE — Discharge Instructions (Signed)
It was our pleasure to provide your ER care today - we hope that you feel better.  Rest. Drink plenty of fluids.   In the ER tonight, your blood pressure is mildly elevated, and not low.   From today's labs, your kidney function test is mildly elevated, similar to prior (creatinine 1.65) - drink adequate fluids, avoid taking nsaid type medication such as ibuprofen/motrin or naprosyn/aleve, and follow up with your primary care doctor.   Return to ER if worse, new symptoms, fevers, chest pain, trouble breathing, weak/fainting, or other concern.

## 2019-10-19 ENCOUNTER — Ambulatory Visit: Payer: Medicare Other | Admitting: Podiatry

## 2019-11-30 ENCOUNTER — Ambulatory Visit: Payer: Medicare Other | Admitting: Podiatry

## 2019-12-29 ENCOUNTER — Other Ambulatory Visit: Payer: Self-pay

## 2019-12-29 ENCOUNTER — Emergency Department (HOSPITAL_COMMUNITY)
Admission: EM | Admit: 2019-12-29 | Discharge: 2019-12-29 | Disposition: A | Discharge status: Home / Self Care | Payer: Medicare Other | Attending: Emergency Medicine | Admitting: Emergency Medicine

## 2019-12-29 ENCOUNTER — Encounter (HOSPITAL_COMMUNITY): Payer: Self-pay | Admitting: Emergency Medicine

## 2019-12-29 DIAGNOSIS — Z5321 Procedure and treatment not carried out due to patient leaving prior to being seen by health care provider: Secondary | ICD-10-CM | POA: Insufficient documentation

## 2019-12-29 DIAGNOSIS — R42 Dizziness and giddiness: Secondary | ICD-10-CM | POA: Diagnosis not present

## 2019-12-29 DIAGNOSIS — R531 Weakness: Secondary | ICD-10-CM | POA: Diagnosis present

## 2019-12-29 LAB — CBC
HCT: 36.7 % — ABNORMAL LOW (ref 39.0–52.0)
Hemoglobin: 11.6 g/dL — ABNORMAL LOW (ref 13.0–17.0)
MCH: 28.4 pg (ref 26.0–34.0)
MCHC: 31.6 g/dL (ref 30.0–36.0)
MCV: 90 fL (ref 80.0–100.0)
Platelets: 141 10*3/uL — ABNORMAL LOW (ref 150–400)
RBC: 4.08 MIL/uL — ABNORMAL LOW (ref 4.22–5.81)
RDW: 13.4 % (ref 11.5–15.5)
WBC: 5.7 10*3/uL (ref 4.0–10.5)
nRBC: 0 % (ref 0.0–0.2)

## 2019-12-29 LAB — BASIC METABOLIC PANEL
Anion gap: 10 (ref 5–15)
BUN: 20 mg/dL (ref 8–23)
CO2: 24 mmol/L (ref 22–32)
Calcium: 8.8 mg/dL — ABNORMAL LOW (ref 8.9–10.3)
Chloride: 107 mmol/L (ref 98–111)
Creatinine, Ser: 1.88 mg/dL — ABNORMAL HIGH (ref 0.61–1.24)
GFR calc Af Amer: 36 mL/min — ABNORMAL LOW (ref >60)
GFR calc non Af Amer: 31 mL/min — ABNORMAL LOW (ref >60)
Glucose, Bld: 109 mg/dL — ABNORMAL HIGH (ref 70–99)
Potassium: 4.1 mmol/L (ref 3.5–5.1)
Sodium: 141 mmol/L (ref 135–145)

## 2019-12-29 NOTE — ED Triage Notes (Signed)
Patient has felt a little weaker x1 week, gets tired easily. States he was out shopping today and when he got home he felt very dizzy and had to lie down, he almost passed out. Denies weakness to one side, chest pain or SOB.

## 2020-01-14 ENCOUNTER — Ambulatory Visit (HOSPITAL_COMMUNITY)
Admission: EM | Admit: 2020-01-14 | Discharge: 2020-01-14 | Disposition: A | Discharge status: Home / Self Care | Payer: Medicare Other | Attending: Emergency Medicine | Admitting: Emergency Medicine

## 2020-01-14 ENCOUNTER — Other Ambulatory Visit: Payer: Self-pay

## 2020-01-14 ENCOUNTER — Ambulatory Visit (INDEPENDENT_AMBULATORY_CARE_PROVIDER_SITE_OTHER): Payer: Medicare Other

## 2020-01-14 ENCOUNTER — Encounter (HOSPITAL_COMMUNITY): Payer: Self-pay

## 2020-01-14 DIAGNOSIS — Z87891 Personal history of nicotine dependence: Secondary | ICD-10-CM | POA: Diagnosis not present

## 2020-01-14 DIAGNOSIS — Z791 Long term (current) use of non-steroidal anti-inflammatories (NSAID): Secondary | ICD-10-CM | POA: Insufficient documentation

## 2020-01-14 DIAGNOSIS — Z79899 Other long term (current) drug therapy: Secondary | ICD-10-CM | POA: Diagnosis not present

## 2020-01-14 DIAGNOSIS — E785 Hyperlipidemia, unspecified: Secondary | ICD-10-CM | POA: Diagnosis not present

## 2020-01-14 DIAGNOSIS — J45909 Unspecified asthma, uncomplicated: Secondary | ICD-10-CM | POA: Insufficient documentation

## 2020-01-14 DIAGNOSIS — R001 Bradycardia, unspecified: Secondary | ICD-10-CM | POA: Insufficient documentation

## 2020-01-14 DIAGNOSIS — G4733 Obstructive sleep apnea (adult) (pediatric): Secondary | ICD-10-CM | POA: Insufficient documentation

## 2020-01-14 DIAGNOSIS — R42 Dizziness and giddiness: Secondary | ICD-10-CM | POA: Insufficient documentation

## 2020-01-14 DIAGNOSIS — Z20822 Contact with and (suspected) exposure to covid-19: Secondary | ICD-10-CM | POA: Insufficient documentation

## 2020-01-14 DIAGNOSIS — R109 Unspecified abdominal pain: Secondary | ICD-10-CM | POA: Diagnosis not present

## 2020-01-14 DIAGNOSIS — R05 Cough: Secondary | ICD-10-CM

## 2020-01-14 DIAGNOSIS — R5383 Other fatigue: Secondary | ICD-10-CM | POA: Diagnosis not present

## 2020-01-14 DIAGNOSIS — Z96651 Presence of right artificial knee joint: Secondary | ICD-10-CM | POA: Diagnosis not present

## 2020-01-14 DIAGNOSIS — Z7951 Long term (current) use of inhaled steroids: Secondary | ICD-10-CM | POA: Diagnosis not present

## 2020-01-14 DIAGNOSIS — I10 Essential (primary) hypertension: Secondary | ICD-10-CM | POA: Diagnosis not present

## 2020-01-14 LAB — COMPREHENSIVE METABOLIC PANEL
ALT: 23 U/L (ref 0–44)
AST: 16 U/L (ref 15–41)
Albumin: 3.6 g/dL (ref 3.5–5.0)
Alkaline Phosphatase: 54 U/L (ref 38–126)
Anion gap: 9 (ref 5–15)
BUN: 19 mg/dL (ref 8–23)
CO2: 24 mmol/L (ref 22–32)
Calcium: 9.1 mg/dL (ref 8.9–10.3)
Chloride: 106 mmol/L (ref 98–111)
Creatinine, Ser: 1.58 mg/dL — ABNORMAL HIGH (ref 0.61–1.24)
GFR calc Af Amer: 44 mL/min — ABNORMAL LOW (ref >60)
GFR calc non Af Amer: 38 mL/min — ABNORMAL LOW (ref >60)
Glucose, Bld: 92 mg/dL (ref 70–99)
Potassium: 3.9 mmol/L (ref 3.5–5.1)
Sodium: 139 mmol/L (ref 135–145)
Total Bilirubin: 0.8 mg/dL (ref 0.3–1.2)
Total Protein: 6.8 g/dL (ref 6.5–8.1)

## 2020-01-14 LAB — POCT URINALYSIS DIP (DEVICE)
Bilirubin Urine: NEGATIVE
Glucose, UA: NEGATIVE mg/dL
Hgb urine dipstick: NEGATIVE
Ketones, ur: NEGATIVE mg/dL
Leukocytes,Ua: NEGATIVE
Nitrite: NEGATIVE
Protein, ur: NEGATIVE mg/dL
Specific Gravity, Urine: 1.025 (ref 1.005–1.030)
Urobilinogen, UA: 0.2 mg/dL (ref 0.0–1.0)
pH: 5 (ref 5.0–8.0)

## 2020-01-14 LAB — CBC WITH DIFFERENTIAL/PLATELET
Abs Immature Granulocytes: 0.02 10*3/uL (ref 0.00–0.07)
Basophils Absolute: 0 10*3/uL (ref 0.0–0.1)
Basophils Relative: 0 %
Eosinophils Absolute: 0.1 10*3/uL (ref 0.0–0.5)
Eosinophils Relative: 2 %
HCT: 38.8 % — ABNORMAL LOW (ref 39.0–52.0)
Hemoglobin: 12.2 g/dL — ABNORMAL LOW (ref 13.0–17.0)
Immature Granulocytes: 0 %
Lymphocytes Relative: 22 %
Lymphs Abs: 1.3 10*3/uL (ref 0.7–4.0)
MCH: 27.9 pg (ref 26.0–34.0)
MCHC: 31.4 g/dL (ref 30.0–36.0)
MCV: 88.6 fL (ref 80.0–100.0)
Monocytes Absolute: 0.5 10*3/uL (ref 0.1–1.0)
Monocytes Relative: 8 %
Neutro Abs: 4 10*3/uL (ref 1.7–7.7)
Neutrophils Relative %: 68 %
Platelets: 145 10*3/uL — ABNORMAL LOW (ref 150–400)
RBC: 4.38 MIL/uL (ref 4.22–5.81)
RDW: 13.4 % (ref 11.5–15.5)
WBC: 6 10*3/uL (ref 4.0–10.5)
nRBC: 0 % (ref 0.0–0.2)

## 2020-01-14 LAB — SARS CORONAVIRUS 2 (TAT 6-24 HRS): SARS Coronavirus 2: NEGATIVE

## 2020-01-14 NOTE — ED Provider Notes (Signed)
Christopher Shepherd    CSN: [id number] Arrival date & time: 01/14/20  0908      History   Chief Complaint Chief Complaint  Patient presents with   Dizziness   Cough    HPI Christopher Shepherd is a 86 y.o. male.   Christopher Shepherd presents with complaints of dizziness for the past two weeks. Not worsening but not improving. Noted it first last week while bending over, felt like he could fall. A few nights ago while dreaming he rolled out of bed. Didn't hit head or lose consciousness. No other falls. No headache. He notices increased fatigued with activity. He notices dizziness with bending or any quick change of position. No shortness of breath . No increased swelling. Some cough, but this is not new for him necessarily. [facility], with labs obtained and no changes to medical treatment. He takes his BP and HR daily and provides log. Noted hr in 50's and low 60's over the past two weeks of data he provides. He has been eating and drinking normally. No palpitations. Endorses taking metoprolol     ROS per HPI, negative if not otherwise mentioned.      Past Medical History:  Diagnosis Date   Allergic rhinitis    Anemia    Arthritis    osteoarthritis   Asthma 1994   with bronchitis   Cough    Hypertension    Obstructive sleep apnea    uses a C-Pap    Patient Active Problem List   Diagnosis Date Noted   Chronic asthmatic bronchitis with acute exacerbation ([place]) 07/16/2014   Pain in lower limb 11/24/2013   S/P total knee arthroplasty 10/23/2013   Hyperlipidemia 10/08/2013   Constipation 10/08/2013   Essential hypertension, benign 10/08/2013   Hiccups 10/08/2013   Acute blood loss anemia 10/06/2013   OA (osteoarthritis) of knee 10/04/2013   Onychomycosis 10/30/2012   Pain in joint, ankle and foot 10/30/2012   Allergic rhinitis due to pollen 09/05/2010   Obstructive sleep apnea 04/16/2007    Allergic-infective asthma 04/16/2007   COUGH 04/16/2007    Past Surgical History:  Procedure Laterality Date   COLONOSCOPY W/ POLYPECTOMY     none     TOTAL KNEE ARTHROPLASTY Right 10/04/2013   Procedure: RIGHT TOTAL KNEE ARTHROPLASTY;  Surgeon: Gearlean Alf, MD;  Location: [facility];  Service: Orthopedics;  Laterality: Right;       Home Medications    Prior to Admission medications   Medication Sig Start Date End Date Taking? Authorizing Provider  acetaminophen (TYLENOL 8 HOUR ARTHRITIS PAIN) 650 MG CR tablet Take 650 mg by mouth every 8 (eight) hours as needed for pain.   Yes [provider]  amLODipine (NORVASC) 10 MG tablet Take 10 mg by mouth daily.   Yes [provider]  Cholecalciferol (VITAMIN D-3) 1000 units CAPS cholecalciferol (vitamin D3) 2,000 unit tablet  1 tablet orally once a day   Yes [provider]  losartan (COZAAR) 100 MG tablet Take 100 mg by mouth every morning.   Yes [provider]  metoprolol succinate (TOPROL-XL) 25 MG 24 hr tablet metoprolol succinate ER 25 mg tablet,extended release 24 hr  TAKE 1 TABLET BY MOUTH ONCE DAILY   Yes [provider]  rosuvastatin (CRESTOR) 20 MG tablet Take 20 mg by mouth daily.   Yes [provider]  amoxicillin (AMOXIL) 500 MG capsule TAKE 1 CAPSULE BY MOUTH EVERY 8  HOURS UNTIL GONE 02/16/19   [provider]  Coenzyme Q10 (CO Q 10 PO) Co Q-10 300 mg capsule  Take 1 capsule every day by oral route.    [provider]  diclofenac sodium (VOLTAREN) 1 % GEL diclofenac 1 % topical gel  APPLY 2 GRAM TO THE AFFECTED AREA(S) BY TOPICAL ROUTE 4 TIMES PER DAY    [provider]  mometasone (NASONEX) 50 MCG/ACT nasal spray 1-2 sprays each nostril daily at bedtime while needed Patient taking differently: Place 1-2 sprays into the nose at bedtime as needed (allergies).  08/12/16   Baird Lyons D, MD  Omega 3 1000 MG CAPS Take 1 capsule by mouth.     [provider]    Family History Family History  Problem Relation Age of Onset   Cancer Father        cause of death   Colon cancer Mother        cause of death    Social History Social History   Tobacco Use   Smoking status: Former Smoker    Packs/day: 0.20    Years: 15.00    Pack years: 3.00    Types: Cigarettes    Quit date: 07/01/1968    Years since quitting: 51.5   Smokeless tobacco: Former Systems developer   Tobacco comment: Quit 50 yrs ago as of 2014  Substance Use Topics   Alcohol use: No    Alcohol/week: 3.0 standard drinks    Types: 3 Cans of beer per week    Comment: occasionally   Drug use: No     Allergies   Patient has no known allergies.   Review of Systems Review of Systems   Physical Exam Triage Vital Signs ED Triage Vitals  Enc Vitals Group     BP 01/14/20 1019 (!) 155/70     Pulse Rate 01/14/20 1019 68     Resp 01/14/20 1019 20     Temp 01/14/20 1019 98.3 F (36.8 C)     Temp Source 01/14/20 1019 Oral     SpO2 01/14/20 1019 100 %     Weight --      Height --      Head Circumference --      Peak Flow --      Pain Score 01/14/20 1022 0     Pain Loc --      Pain Edu? --      Excl. in [place]? --    Orthostatic VS for the past 24 hrs:  BP- Lying Pulse- Lying BP- Sitting Pulse- Sitting BP- Standing at 0 minutes Pulse- Standing at 0 minutes  01/14/20 1045 146/61 51 139/54 53 114/50 60    Updated Vital Signs BP (!) 155/70 (BP Location: Left Arm)    Pulse 68    Temp 98.3 F (36.8 C) (Oral)    Resp 20    SpO2 100%    Physical Exam Constitutional:      Appearance: He is well-developed.  HENT:     Head: Normocephalic and atraumatic.     Nose: Nose normal.     Mouth/Throat:     Mouth: Mucous membranes are moist.  Cardiovascular:     Rate and Rhythm: Normal rate and regular rhythm.  Pulmonary:     Effort: Pulmonary effort is normal.     Breath sounds: Normal breath sounds.  Skin:    General: Skin is warm and dry.   Neurological:     General: No focal deficit  present.     Mental Status: He is alert and oriented to person, place, and time. Mental status is at baseline.     Motor: No weakness.     Coordination: Coordination normal.    EKG:  Sinus bradycardia , rate of 59 . Previous EKG was available for review. No stwave changes as interpreted by me.    UC Treatments / Results  Labs (all labs ordered are listed, but only abnormal results are displayed) Labs Reviewed  CBC WITH DIFFERENTIAL/PLATELET - Abnormal; Notable for the following components:      Result Value   Hemoglobin 12.2 (*)    HCT 38.8 (*)    Platelets 145 (*)    All other components within normal limits  COMPREHENSIVE METABOLIC PANEL - Abnormal; Notable for the following components:   Creatinine, Ser 1.58 (*)    GFR calc non Af Amer 38 (*)    GFR calc Af Amer 44 (*)    All other components within normal limits  SARS CORONAVIRUS 2 (TAT 6-24 HRS)  POCT URINALYSIS DIP (DEVICE)    EKG   Radiology DG Abd Acute W/Chest  Result Date: 01/14/2020 CLINICAL DATA:  Cough and abdominal pain. EXAM: DG ABDOMEN ACUTE W/ 1V CHEST COMPARISON:  Chest x-ray 10/09/2019 FINDINGS: The lungs are clear without focal pneumonia, edema, pneumothorax or pleural effusion. The cardiopericardial silhouette is within normal limits for size. The visualized bony structures of the thorax show now acute abnormality. Upright film shows no evidence for intraperitoneal free air. Mild distension of bowel in the central abdomen, nonspecific. No unexpected abdominopelvic calcification. Degenerative changes noted lumbosacral junction. IMPRESSION: 1. No acute cardiopulmonary findings. 2. Gaseous distention of central bowel loops, nonspecific. CT imaging of the abdomen/pelvis could be used to further evaluate as clinically warranted. Electronically Signed   By: Misty Stanley M.D.   On: 01/14/2020 11:31    Procedures Procedures (including critical care time)  Medications  Ordered in UC Medications - No data to display  Initial Impression / Assessment and Plan / UC Course  I have reviewed the triage vital signs and the nursing notes.  Pertinent labs & imaging results that were available during my care of the patient were reviewed by me and considered in my medical decision making (see chart for details).     Chest/abd series without acute findings. Patient with orthostatic changes noted. Labs at baseline for patient. Ambulatory without difficulty. No red flag findings. No other neurological findings. Bradycardia noted. This does not appear to be new for patient, however. Now symptomatic? Encouraged close follow up with cardiology. Encouraged adequate fluid intake. Strict return precautions. Patient verbalized understanding and agreeable to plan.  Ambulatory out of clinic without difficulty.    Final Clinical Impressions(s) / UC Diagnoses   Final diagnoses:  Dizziness  Bradycardia     Discharge Instructions     Your chest xray looks normal today which is reassuring.  I will check a few additional labs to ensure these look well.  I will call you if these are concerning.  I would like you to increase the amount of fluid intake to try to help with your dizziness.  Please be sure to change positions slowly to prevent any falls.  I would like you to follow up with cardiology on Monday, through the [place] or through [place].  If any worsening of your symptoms- confusion, weakness, falls, chest pain or palpitations, or otherwise worsening please go to the ER.    ED Prescriptions  None     PDMP not reviewed this encounter.   Zigmund Gottron, NP 01/14/20 1526

## 2020-01-14 NOTE — ED Triage Notes (Addendum)
Pt c/o acute dizziness onset approx 3 weeks ago after bringing in groceries in the heat. Pt states when he bends over or closes his eyes, he feels dizzy. Denies LOC, CP, SOB.   Also reports non-productive cough for approx 1 week, fatigue when walking, ear fullness, right flank/abdominal pain that increases when turning/bending over,  bilateral leg swelling. Pt denies congestion, runny nose, sore throat, dysuria sx, extremity weakness, changes in vision, slurred speech, fever, chills, n/v/d or other c/o.  +3 edema to bilat lower legs; bilat breath sounds CTA. Smile symmetrical, PERRLA, grips equal/strong, leg strength equal/strong. Speech clear.

## 2020-01-14 NOTE — Discharge Instructions (Addendum)
Your chest xray looks normal today which is reassuring.  I will check a few additional labs to ensure these look well.  I will call you if these are concerning.  I would like you to increase the amount of fluid intake to try to help with your dizziness.  Please be sure to change positions slowly to prevent any falls.  I would like you to follow up with cardiology on Monday, through the [place] or through [place].  If any worsening of your symptoms- confusion, weakness, falls, chest pain or palpitations, or otherwise worsening please go to the ER.

## 2020-01-24 ENCOUNTER — Emergency Department (HOSPITAL_COMMUNITY): Payer: Medicare Other

## 2020-01-24 ENCOUNTER — Other Ambulatory Visit: Payer: Self-pay

## 2020-01-24 ENCOUNTER — Encounter (HOSPITAL_COMMUNITY): Payer: Self-pay | Admitting: Emergency Medicine

## 2020-01-24 ENCOUNTER — Emergency Department (HOSPITAL_COMMUNITY)
Admission: EM | Admit: 2020-01-24 | Discharge: 2020-01-24 | Disposition: A | Discharge status: Home / Self Care | Payer: Medicare Other | Attending: Emergency Medicine | Admitting: Emergency Medicine

## 2020-01-24 DIAGNOSIS — I10 Essential (primary) hypertension: Secondary | ICD-10-CM | POA: Insufficient documentation

## 2020-01-24 DIAGNOSIS — J441 Chronic obstructive pulmonary disease with (acute) exacerbation: Secondary | ICD-10-CM | POA: Diagnosis not present

## 2020-01-24 DIAGNOSIS — R55 Syncope and collapse: Secondary | ICD-10-CM | POA: Diagnosis not present

## 2020-01-24 DIAGNOSIS — Z87891 Personal history of nicotine dependence: Secondary | ICD-10-CM | POA: Diagnosis not present

## 2020-01-24 LAB — CBC WITH DIFFERENTIAL/PLATELET
Abs Immature Granulocytes: 0.04 10*3/uL (ref 0.00–0.07)
Basophils Absolute: 0 10*3/uL (ref 0.0–0.1)
Basophils Relative: 0 %
Eosinophils Absolute: 0.1 10*3/uL (ref 0.0–0.5)
Eosinophils Relative: 1 %
HCT: 40 % (ref 39.0–52.0)
Hemoglobin: 12.5 g/dL — ABNORMAL LOW (ref 13.0–17.0)
Immature Granulocytes: 1 %
Lymphocytes Relative: 15 %
Lymphs Abs: 1 10*3/uL (ref 0.7–4.0)
MCH: 28.3 pg (ref 26.0–34.0)
MCHC: 31.3 g/dL (ref 30.0–36.0)
MCV: 90.5 fL (ref 80.0–100.0)
Monocytes Absolute: 0.4 10*3/uL (ref 0.1–1.0)
Monocytes Relative: 6 %
Neutro Abs: 5.1 10*3/uL (ref 1.7–7.7)
Neutrophils Relative %: 77 %
Platelets: 159 10*3/uL (ref 150–400)
RBC: 4.42 MIL/uL (ref 4.22–5.81)
RDW: 13.4 % (ref 11.5–15.5)
WBC: 6.6 10*3/uL (ref 4.0–10.5)
nRBC: 0 % (ref 0.0–0.2)

## 2020-01-24 LAB — COMPREHENSIVE METABOLIC PANEL
ALT: 20 U/L (ref 0–44)
AST: 16 U/L (ref 15–41)
Albumin: 3.9 g/dL (ref 3.5–5.0)
Alkaline Phosphatase: 54 U/L (ref 38–126)
Anion gap: 10 (ref 5–15)
BUN: 19 mg/dL (ref 8–23)
CO2: 26 mmol/L (ref 22–32)
Calcium: 8.8 mg/dL — ABNORMAL LOW (ref 8.9–10.3)
Chloride: 104 mmol/L (ref 98–111)
Creatinine, Ser: 1.46 mg/dL — ABNORMAL HIGH (ref 0.61–1.24)
GFR calc Af Amer: 48 mL/min — ABNORMAL LOW (ref >60)
GFR calc non Af Amer: 42 mL/min — ABNORMAL LOW (ref >60)
Glucose, Bld: 94 mg/dL (ref 70–99)
Potassium: 4 mmol/L (ref 3.5–5.1)
Sodium: 140 mmol/L (ref 135–145)
Total Bilirubin: 1.1 mg/dL (ref 0.3–1.2)
Total Protein: 7.2 g/dL (ref 6.5–8.1)

## 2020-01-24 NOTE — ED Notes (Signed)
Ambulated pt in the hallway. Pt stated that he was not dizzy and felt strong in walking. Pt requested the use of his cane for extra support and was provided by wife. Pt ambulated w/ cane to the restroom.

## 2020-01-24 NOTE — ED Provider Notes (Signed)
Signout from Dr. Maryan Rued.  86 year old male history of hypertension here after a syncopal event at the grocery store.  Patient was reportedly orthostatic.  Currently denies any complaints.  Getting labs.  Plan is to recheck and ambulate and if no significant abnormalities identified can be discharged. Physical Exam  BP (!) 177/79 (BP Location: Right Arm)   Pulse 68   Temp 98.2 F (36.8 C) (Oral)   Resp 17   Ht 5\' 7"  (1.702 m)   Wt 83.9 kg   SpO2 99%   BMI 28.98 kg/m   Physical Exam  ED Course/Procedures     Procedures  MDM  Patient's labs show his creatinine to be elevated but better than baseline.  Stable hemoglobin.  Orthostatics unremarkable.  Patient ambulated in the department with his cane and felt to be at baseline.  Will discharge.       Hayden Rasmussen, MD 01/25/20 [phone]

## 2020-01-24 NOTE — ED Provider Notes (Signed)
[facility] DEPT Provider Note   CSN: [id number] Arrival date & time: 01/24/20  1530     History Chief Complaint  Patient presents with  . Fall  . Dizziness    Christopher Shepherd is a 86 y.o. male.  Patient is a 86 year old male with a history of hypertension, sleep apnea, prior anemia and asthma who presents today after a syncopal episode at the grocery store.  Patient reports that for the last few weeks he has had dizziness which seems to be more when he bends over or has quick movements but is not gotten any worse.  He was seen in urgent care 2 weeks ago and reports everything checked out and he is otherwise been feeling his normal self.  He has been eating and drinking like he normally does but his wife feels like he does not drink enough.  Patient [place] and he was outside in the heat.  He thinks he was only there for 15 minutes but his wife thinks it may have been longer.  He then went to the grocery store.  He was pushing around the grocery cart when he said he felt slightly dizzy and then heard a lady yelling at him and said he was able to get up quickly.  However he states at the grocery store they check his blood pressure and it was elevated.  They showed him a camera which look like he passed out but he cannot remember the event.  He denies any chest pain, palpitations, nausea or diaphoresis before or after the event.  He reports he feels his normal self right now.  He has been checking his blood pressure regularly and it has not changed and he has not recently changed any medications.  He does report some swelling in his lower extremities but states they have been that way for years.  He also reports that he has had a decreased appetite over time and has had some weight loss but nothing rapid.  He has not had any cough, congestion or fever.  He denies any urinary symptoms.  The history is provided by the patient.  Loss of  Consciousness Episode history:  Single Most recent episode:  Today Timing:  Constant Progression:  Resolved Chronicity:  New Witnessed: yes   Relieved by:  Lying down Worsened by:  Nothing Ineffective treatments:  None tried Associated symptoms: dizziness   Associated symptoms: no chest pain, no difficulty breathing, no focal weakness, no headaches, no malaise/fatigue, no nausea, no palpitations, no shortness of breath, no vomiting and no weakness        Past Medical History:  Diagnosis Date  . Allergic rhinitis   . Anemia   . Arthritis    osteoarthritis  . Asthma 1994   with bronchitis  . Cough   . Hypertension   . Obstructive sleep apnea    uses a C-Pap    Patient Active Problem List   Diagnosis Date Noted  . Chronic asthmatic bronchitis with acute exacerbation ([place]) 07/16/2014  . Pain in lower limb 11/24/2013  . S/P total knee arthroplasty 10/23/2013  . Hyperlipidemia 10/08/2013  . Constipation 10/08/2013  . Essential hypertension, benign 10/08/2013  . Hiccups 10/08/2013  . Acute blood loss anemia 10/06/2013  . OA (osteoarthritis) of knee 10/04/2013  . Onychomycosis 10/30/2012  . Pain in joint, ankle and foot 10/30/2012  . Allergic rhinitis due to pollen 09/05/2010  . Obstructive sleep apnea 04/16/2007  .  Allergic-infective asthma 04/16/2007  . COUGH 04/16/2007    Past Surgical History:  Procedure Laterality Date  . COLONOSCOPY W/ POLYPECTOMY    . none    . TOTAL KNEE ARTHROPLASTY Right 10/04/2013   Procedure: RIGHT TOTAL KNEE ARTHROPLASTY;  Surgeon: Gearlean Alf, MD;  Location: [facility];  Service: Orthopedics;  Laterality: Right;       Family History  Problem Relation Age of Onset  . Cancer Father        cause of death  . Colon cancer Mother        cause of death    Social History   Tobacco Use  . Smoking status: Former Smoker    Packs/day: 0.20    Years: 15.00    Pack years: 3.00    Types: Cigarettes    Quit date: 07/01/1968    Years since  quitting: 51.6  . Smokeless tobacco: Former Systems developer  . Tobacco comment: Quit 50 yrs ago as of 2014  Substance Use Topics  . Alcohol use: No    Alcohol/week: 3.0 standard drinks    Types: 3 Cans of beer per week    Comment: occasionally  . Drug use: No    Home Medications Prior to Admission medications   Medication Sig Start Date End Date Taking? Authorizing Provider  acetaminophen (TYLENOL 8 HOUR ARTHRITIS PAIN) 650 MG CR tablet Take 650 mg by mouth every 8 (eight) hours as needed for pain.    [provider]  amLODipine (NORVASC) 10 MG tablet Take 10 mg by mouth daily.    [provider]  amoxicillin (AMOXIL) 500 MG capsule TAKE 1 CAPSULE BY MOUTH EVERY 8 HOURS UNTIL GONE 02/16/19   [provider]  Cholecalciferol (VITAMIN D-3) 1000 units CAPS cholecalciferol (vitamin D3) 2,000 unit tablet  1 tablet orally once a day    [provider]  Coenzyme Q10 (CO Q 10 PO) Co Q-10 300 mg capsule  Take 1 capsule every day by oral route.    [provider]  diclofenac sodium (VOLTAREN) 1 % GEL diclofenac 1 % topical gel  APPLY 2 GRAM TO THE AFFECTED AREA(S) BY TOPICAL ROUTE 4 TIMES PER DAY    [provider]  losartan (COZAAR) 100 MG tablet Take 100 mg by mouth every morning.    [provider]  metoprolol succinate (TOPROL-XL) 25 MG 24 hr tablet metoprolol succinate ER 25 mg tablet,extended release 24 hr  TAKE 1 TABLET BY MOUTH ONCE DAILY    [provider]  mometasone (NASONEX) 50 MCG/ACT nasal spray 1-2 sprays each nostril daily at bedtime while needed Patient taking differently: Place 1-2 sprays into the nose at bedtime as needed (allergies).  08/12/16   Baird Lyons D, MD  Omega 3 1000 MG CAPS Take 1 capsule by mouth.    [provider]  rosuvastatin (CRESTOR) 20 MG tablet Take 20 mg by mouth daily.    [provider]    Allergies    Patient has no known allergies.  Review of Systems   Review of  Systems  Constitutional: Negative for malaise/fatigue.  Respiratory: Negative for shortness of breath.   Cardiovascular: Positive for syncope. Negative for chest pain and palpitations.  Gastrointestinal: Negative for nausea and vomiting.  Neurological: Positive for dizziness. Negative for focal weakness, weakness and headaches.  All other systems reviewed and are negative.   Physical Exam Updated Vital Signs BP (!) 177/79 (BP Location: Right Arm)   Pulse 68   Temp 98.2  F (36.8 C) (Oral)   Resp 17   Ht 5\' 7"  (1.702 m)   Wt 83.9 kg   SpO2 99%   BMI 28.98 kg/m   Physical Exam Vitals and nursing note reviewed.  Constitutional:      General: He is not in acute distress.    Appearance: Normal appearance. He is well-developed and normal weight.  HENT:     Head: Normocephalic and atraumatic.  Eyes:     Conjunctiva/sclera: Conjunctivae normal.     Pupils: Pupils are equal, round, and reactive to light.  Cardiovascular:     Rate and Rhythm: Regular rhythm. Bradycardia present.     Heart sounds: No murmur heard.   Pulmonary:     Effort: Pulmonary effort is normal. No respiratory distress.     Breath sounds: Normal breath sounds. No wheezing or rales.  Abdominal:     General: There is no distension.     Palpations: Abdomen is soft.     Tenderness: There is no abdominal tenderness. There is no guarding or rebound.  Musculoskeletal:        General: No tenderness. Normal range of motion.     Cervical back: Normal range of motion and neck supple.     Right lower leg: Edema present.     Left lower leg: Edema present.     Comments: Trace edema bilateral lower extremities  Skin:    General: Skin is warm and dry.     Capillary Refill: Capillary refill takes less than 2 seconds.     Findings: No erythema or rash.  Neurological:     General: No focal deficit present.     Mental Status: He is alert and oriented to person, place, and time. Mental status is at baseline.  Psychiatric:         Mood and Affect: Mood normal.        Behavior: Behavior normal.        Thought Content: Thought content normal.     ED Results / Procedures / Treatments   Labs (all labs ordered are listed, but only abnormal results are displayed) Labs Reviewed  CBC WITH DIFFERENTIAL/PLATELET - Abnormal; Notable for the following components:      Result Value   Hemoglobin 12.5 (*)    All other components within normal limits  COMPREHENSIVE METABOLIC PANEL    EKG EKG Interpretation  Date/Time:  Monday January 24 2020 15:53:12 EDT Ventricular Rate:  65 PR Interval:    QRS Duration: 118 QT Interval:  431 QTC Calculation: 449 R Axis:   -3 Text Interpretation: Sinus rhythm Nonspecific intraventricular conduction delay No significant change since last tracing Confirmed by Blanchie Dessert [phone]) on 01/24/2020 4:47:36 PM   Radiology No results found.  Procedures Procedures (including critical care time)  Medications Ordered in ED Medications - No data to display  ED Course  I have reviewed the triage vital signs and the nursing notes.  Pertinent labs & imaging results that were available during my care of the patient were reviewed by me and considered in my medical decision making (see chart for details).    MDM Rules/Calculators/A&P                          Patient is an elderly male with a syncopal episode at the grocery store today.  He had some slight dizziness before the event but no other preceding symptoms or symptoms after the event.  Patient's EKG is  unchanged.  Chest x-ray and labs are pending.  He was orthostatic here with a blood pressure of 152/74 while sitting and 124/77 with standing.  He received 500 and fluid prior to arrival and was not given any further fluid.  Will ensure no change in labs or signs of anemia.  Otherwise low suspicion for dissection, PE, dysrhythmia.  Patient is displaying no symptoms suggestive of ACS at this time. Patient will need to ambulate  before discharge to ensure no recurrent symptoms.  Patient checked out to Dr. Melina Copa at 5 PM. Final Clinical Impression(s) / ED Diagnoses Final diagnoses:  Syncope, unspecified syncope type    Rx / DC Orders ED Discharge Orders    None       Blanchie Dessert, MD 01/24/20 1733

## 2020-01-24 NOTE — ED Triage Notes (Signed)
Patient present post unwitnessed fall in [place]. He was found unconscious for about 1 min. He was positive for ortho statis vitals. BP sitting was 152/74, BP standing was 124/77. EMS administered 500 ml in fluid.   EMS vitals: 116 CBG 70 HR 99% SPO2 on room air

## 2020-01-24 NOTE — Discharge Instructions (Addendum)
You were seen in the emergency department for evaluation of a fainting spell at the grocery store.  You had an EKG and blood work that did not show any serious abnormalities.  You probably are dehydrated and need to drink more fluids.  Please schedule follow-up appointment with your doctor.  Return to the emergency department for any worsening or concerning symptoms

## 2020-01-26 NOTE — Progress Notes (Signed)
Cardiology Office Note:   Date:  01/28/2020  NAME:  Christopher Shepherd    MRN: [id number] DOB:  04-Jan-1927   PCP:  Willey Blade, MD  Cardiologist:  No primary care provider on file.  Electrophysiologist:  None   Referring MD: Willey Blade, MD   Chief Complaint  Patient presents with   Loss of Consciousness   History of Present Illness:   Christopher Shepherd is a 86 y.o. male with a hx of HTN who is being seen today for the evaluation of syncope at the request of Willey Blade, MD. Seen in the ER 7/26 for syncope. Was at the grocery store and got light headed and dizzy. Passed out but not trauma. Orthostatic in the ER. He reports on the day in question on 01/24/2020 he had been in the garden that morning.  It was a hot day.  He apparently had walked into [employer] and became dizzy and lightheaded and fell on the ground.  He reports he came to within 3 to 5 minutes and was back to his normal self.  He reports EMS was called and he went to the emergency room.  He was noted to be orthostatic in the emergency room.  He was then instructed to follow-up with [place].  He reports no episodes of chest pain or shortness of breath prior to the episode in question.  No rapid heartbeat sensation.  After the event he had no chest pain or shortness of breath no rapid heartbeat sensation.  He did not have any urination or defecation.  There was no seizure-like activity reported.  His medical history is significant for hypertension.  His blood pressure is 139/67.  There apparently are memory issues per the wife and daughter in the room.  They report he is not drinking enough water.  He does sip some water during the day.  I have instructed him to increase his water intake.  He does have some lower extremity edema at times but does wear compression stockings with improvement in this.  He does not exercise routinely and mainly sits around the house.  He reports no chest pain or trouble breathing with his current  level of activity.  He is a former smoker.  Consumes no alcohol and no illicit drugs.  CVD risk factors include hypertension hyperlipidemia.  He is never had a heart attack or stroke.  He reports he is had similar episodes of dizziness and lightheadedness in the past.  These commonly occur when he stands up too quickly or moves or changes position.   Past Medical History: Past Medical History:  Diagnosis Date   Allergic rhinitis    Anemia    Arthritis    osteoarthritis   Asthma 1994   with bronchitis   Cough    Hyperlipidemia    Hypertension    Obstructive sleep apnea    uses a C-Pap    Past Surgical History: Past Surgical History:  Procedure Laterality Date   COLONOSCOPY W/ POLYPECTOMY     none     TOTAL KNEE ARTHROPLASTY Right 10/04/2013   Procedure: RIGHT TOTAL KNEE ARTHROPLASTY;  Surgeon: Gearlean Alf, MD;  Location: [facility];  Service: Orthopedics;  Laterality: Right;    Current Medications: Current Meds  Medication Sig   acetaminophen (TYLENOL 8 HOUR ARTHRITIS PAIN) 650 MG CR tablet Take 650 mg by mouth every 8 (eight) hours as needed for pain.   amoxicillin (AMOXIL) 500 MG capsule TAKE 1 CAPSULE BY MOUTH EVERY 8 HOURS  UNTIL GONE   Cholecalciferol (VITAMIN D-3) 1000 units CAPS Take 1 capsule by mouth daily.    Coenzyme Q10 (CO Q 10 PO) Co Q-10 300 mg capsule  Take 1 capsule every day by oral route.   diclofenac sodium (VOLTAREN) 1 % GEL diclofenac 1 % topical gel  APPLY 2 GRAM TO THE AFFECTED AREA(S) BY TOPICAL ROUTE 4 TIMES PER DAY   losartan (COZAAR) 100 MG tablet Take 100 mg by mouth every morning.   metoprolol succinate (TOPROL-XL) 25 MG 24 hr tablet Take 12.5 mg by mouth daily.    mometasone (NASONEX) 50 MCG/ACT nasal spray 1-2 sprays each nostril daily at bedtime while needed (Patient taking differently: Place 1-2 sprays into the nose at bedtime as needed (allergies). )   Omega 3 1000 MG CAPS Take 1 capsule by mouth.    rosuvastatin (CRESTOR)  20 MG tablet Take 10 mg by mouth every evening.    vitamin B-12 (CYANOCOBALAMIN) 100 MCG tablet Take 100 mcg by mouth daily.   [DISCONTINUED] amLODipine (NORVASC) 10 MG tablet Take 10 mg by mouth every evening.      Allergies:    Patient has no known allergies.   Social History: Social History   Socioeconomic History   Marital status: Married    Spouse name: Not on file   Number of children: 2   Years of education: Not on file   Highest education level: Not on file  Occupational History   Not on file  Tobacco Use   Smoking status: Former Smoker    Packs/day: 0.20    Years: 15.00    Pack years: 3.00    Types: Cigarettes    Quit date: 07/01/1968    Years since quitting: 51.6   Smokeless tobacco: Former Systems developer   Tobacco comment: Quit 50 yrs ago as of 2014  Substance and Sexual Activity   Alcohol use: No    Alcohol/week: 3.0 standard drinks    Types: 3 Cans of beer per week    Comment: occasionally   Drug use: No   Sexual activity: Not on file  Other Topics Concern   Not on file  Social History Narrative   Not on file   Social Determinants of Health   Financial Resource Strain:    Difficulty of Paying Living Expenses:   Food Insecurity:    Worried About Charity fundraiser in the Last Year:    Arboriculturist in the Last Year:   Transportation Needs:    Film/video editor (Medical):    Lack of Transportation (Non-Medical):   Physical Activity:    Days of Exercise per Week:    Minutes of Exercise per Session:   Stress:    Feeling of Stress :   Social Connections:    Frequency of Communication with Friends and Family:    Frequency of Social Gatherings with Friends and Family:    Attends Religious Services:    Active Member of Clubs or Organizations:    Attends Archivist Meetings:    Marital Status:      Family History: The patient's family history includes Cancer in his father; Colon cancer in his mother.  ROS:    All other ROS reviewed and negative. Pertinent positives noted in the HPI.     EKGs/Labs/Other Studies Reviewed:   The following studies were personally reviewed by me today:  EKG:  EKG is ordered today.  The ekg ordered today demonstrates normal sinus rhythm, heart rate 69,  first-degree AV block noted, 210 ms, PACs noted, and was personally reviewed by me.   Recent Labs: 01/24/2020: ALT 20; BUN 19; Creatinine, Ser 1.46; Hemoglobin 12.5; Platelets 159; Potassium 4.0; Sodium 140   Recent Lipid Panel No results found for: CHOL, TRIG, HDL, CHOLHDL, VLDL, LDLCALC, LDLDIRECT  Physical Exam:   VS:  BP (!) 139/67    Pulse 69    Ht 5\' 6"  (1.676 m)    Wt 184 lb 12.8 oz (83.8 kg)    SpO2 99%    BMI 29.83 kg/m    Wt Readings from Last 3 Encounters:  01/28/20 184 lb 12.8 oz (83.8 kg)  01/24/20 185 lb (83.9 kg)  10/09/19 192 lb (87.1 kg)    General: Well nourished, well developed, in no acute distress Heart: Atraumatic, normal size  Eyes: PEERLA, EOMI  Neck: Supple, no JVD Endocrine: No thryomegaly Cardiac: Normal S1, S2; irregular rhythm, likely PACs, no murmurs rubs or gallops Lungs: Clear to auscultation bilaterally, no wheezing, rhonchi or rales  Abd: Soft, nontender, no hepatomegaly  Ext: Trace edema Musculoskeletal: No deformities, BUE and BLE strength normal and equal Skin: Warm and dry, no rashes   Neuro: Alert and oriented to person, place, time, and situation, CNII-XII grossly intact, no focal deficits  Psych: Normal mood and affect   ASSESSMENT:   Christopher Shepherd is a 86 y.o. male who presents for the following: 1. Syncope and collapse   2. Dizziness   3. Orthostatic hypotension   4. Fall, initial encounter   5. PAC (premature atrial contraction)   6. 1st degree AV block   7. Essential hypertension, benign     PLAN:   1. Syncope and collapse 2. Dizziness 3. Orthostatic hypotension 4. Fall, initial encounter -He suffered a fall recently.  This was on his hot day.  He  had not eaten or had much to drink that day.  He was clearly orthostatic in the emergency room.  His EKG is inconsistent with acute ischemic changes. I suspect this was clearly orthostatic hypotension. He had no alarming symptoms prior to the event. No symptoms concerning for seizure. I also think he is on too much BP medication. There is concern for possible dementia and he needs evaluation for this. We will stop his norvasc and encourage increased water and oral intake. He should be cautious with standing too quickly and improve his hydration. I do not see a need for a monitor or stress test given the clear story consistent with orthostatic hypotension, and this was confirmed in the ER. We will also refer him for home physical therapy as this will help prevent future falls.   5. PAC (premature atrial contraction) 6. 1st degree AV block -EKG with PACs and 1AVB. No significant findings on exam. Will proceed with TTE to ensure heart is structurally normal.   7. Essential hypertension, benign -we will stop his norvasc. Too much BP medication. Would allow for higher BP goal to avoid any falls.   Disposition: Return in about 3 months (around 04/29/2020).  Medication Adjustments/Labs and Tests Ordered: Current medicines are reviewed at length with the patient today.  Concerns regarding medicines are outlined above.  Orders Placed This Encounter  Procedures   Ambulatory referral to Social Work   EKG 12-Lead   ECHOCARDIOGRAM COMPLETE   No orders of the defined types were placed in this encounter.   Patient Instructions  Medication Instructions:  Stop amlodipine   *If you need a refill on your cardiac medications before  your next appointment, please call your pharmacy*   Testing/Procedures: Echocardiogram - Your physician has requested that you have an echocardiogram. Echocardiography is a painless test that uses sound waves to create images of your heart. It provides your doctor with  information about the size and shape of your heart and how well your hearts chambers and valves are working. This procedure takes approximately one hour. There are no restrictions for this procedure. This will be performed at our [facility] location - [address], [address].    Follow-Up: At [facility] you and your health needs are our priority.  As part of our continuing mission to provide you with exceptional heart care, we have created designated Provider Care Teams.  These Care Teams include your primary Cardiologist (physician) and Advanced Practice Providers (APPs -  Physician Assistants and Nurse Practitioners) who all work together to provide you with the care you need, when you need it.  We recommend signing up for the patient portal called "MyChart".  Sign up information is provided on this After Visit Summary.  MyChart is used to connect with patients for Virtual Visits (Telemedicine).  Patients are able to view lab/test results, encounter notes, upcoming appointments, etc.  Non-urgent messages can be sent to your provider as well.   To learn more about what you can do with MyChart, go to [email or web address].    Your next appointment:   3 month(s)  The format for your next appointment:   In Person  Provider:   Eleonore Chiquito, MD   Other Instructions Referral over to our [place], she will assist in helping with home health.       Signed, Addison Naegeli. Audie Box, [place]  [address] [place],  [address] [phone]  01/28/2020 2:35 PM

## 2020-01-28 ENCOUNTER — Encounter: Payer: Self-pay | Admitting: Cardiovascular Disease

## 2020-01-28 ENCOUNTER — Other Ambulatory Visit: Payer: Self-pay

## 2020-01-28 ENCOUNTER — Ambulatory Visit (INDEPENDENT_AMBULATORY_CARE_PROVIDER_SITE_OTHER): Payer: Medicare Other | Admitting: Cardiovascular Disease

## 2020-01-28 VITALS — BP 139/67 | HR 69 | Ht 66.0 in | Wt 184.8 lb

## 2020-01-28 DIAGNOSIS — R55 Syncope and collapse: Secondary | ICD-10-CM

## 2020-01-28 DIAGNOSIS — I10 Essential (primary) hypertension: Secondary | ICD-10-CM

## 2020-01-28 DIAGNOSIS — R42 Dizziness and giddiness: Secondary | ICD-10-CM | POA: Diagnosis not present

## 2020-01-28 DIAGNOSIS — I440 Atrioventricular block, first degree: Secondary | ICD-10-CM

## 2020-01-28 DIAGNOSIS — W19XXXA Unspecified fall, initial encounter: Secondary | ICD-10-CM

## 2020-01-28 DIAGNOSIS — I951 Orthostatic hypotension: Secondary | ICD-10-CM | POA: Diagnosis not present

## 2020-01-28 DIAGNOSIS — I491 Atrial premature depolarization: Secondary | ICD-10-CM

## 2020-01-28 NOTE — Patient Instructions (Signed)
Medication Instructions:  Stop amlodipine   *If you need a refill on your cardiac medications before your next appointment, please call your pharmacy*   Testing/Procedures: Echocardiogram - Your physician has requested that you have an echocardiogram. Echocardiography is a painless test that uses sound waves to create images of your heart. It provides your doctor with information about the size and shape of your heart and how well your heart's chambers and valves are working. This procedure takes approximately one hour. There are no restrictions for this procedure. This will be performed at our [facility] location - [address].    Follow-Up: At [facility], you and your health needs are our priority.  As part of our continuing mission to provide you with exceptional heart care, we have created designated Provider Care Teams.  These Care Teams include your primary Cardiologist (physician) and Advanced Practice Providers (APPs -  Physician Assistants and Nurse Practitioners) who all work together to provide you with the care you need, when you need it.  We recommend signing up for the patient portal called "MyChart".  Sign up information is provided on this After Visit Summary.  MyChart is used to connect with patients for Virtual Visits (Telemedicine).  Patients are able to view lab/test results, encounter notes, upcoming appointments, etc.  Non-urgent messages can be sent to your provider as well.   To learn more about what you can do with MyChart, go to [email or web address].    Your next appointment:   3 month(s)  The format for your next appointment:   In Person  Provider:   Eleonore Chiquito, MD   Other Instructions Referral over to our [place], she will assist in helping with home health.

## 2020-01-31 ENCOUNTER — Ambulatory Visit: Payer: Medicare Other

## 2020-01-31 ENCOUNTER — Telehealth: Payer: Self-pay

## 2020-01-31 DIAGNOSIS — Z0189 Encounter for other specified special examinations: Secondary | ICD-10-CM

## 2020-01-31 NOTE — Telephone Encounter (Signed)
Called patient to discuss home health needs with physical therapy (PT) as requested by Dr. Audie Box. Care Guide informed patient that a referral will be made on his behalf for PT services.  Patient stated that he still has swelling in his feet after a medication change. Care Guide will inform Dr. Audie Box of patient's concern.  Care Guide will follow up with patient.

## 2020-02-17 ENCOUNTER — Other Ambulatory Visit: Payer: Self-pay

## 2020-02-17 ENCOUNTER — Ambulatory Visit (HOSPITAL_COMMUNITY): Payer: Medicare Other | Attending: Cardiology

## 2020-02-17 DIAGNOSIS — R55 Syncope and collapse: Secondary | ICD-10-CM

## 2020-02-17 LAB — ECHOCARDIOGRAM COMPLETE
Area-P 1/2: 2.45 cm2
S' Lateral: 3 cm

## 2020-03-29 ENCOUNTER — Other Ambulatory Visit: Payer: Self-pay

## 2020-03-29 ENCOUNTER — Ambulatory Visit (INDEPENDENT_AMBULATORY_CARE_PROVIDER_SITE_OTHER): Payer: Medicare Other | Admitting: Podiatry

## 2020-03-29 ENCOUNTER — Encounter: Payer: Self-pay | Admitting: Podiatry

## 2020-03-29 DIAGNOSIS — B351 Tinea unguium: Secondary | ICD-10-CM | POA: Diagnosis not present

## 2020-03-29 DIAGNOSIS — M79676 Pain in unspecified toe(s): Secondary | ICD-10-CM | POA: Diagnosis not present

## 2020-03-29 NOTE — Progress Notes (Signed)
This patient returns to the office for evaluation and treatment of long thick painful nails .  This patient is unable to trim his own nails since the patient cannot reach his feet.  Patient says the nails are painful walking and wearing his shoes.  He returns for preventive foot care services.  General Appearance  Alert, conversant and in no acute stress.  Vascular  Dorsalis pedis and posterior tibial  pulses are weakly  palpable  bilaterally.  Capillary return is within normal limits  bilaterally. Temperature is within normal limits  bilaterally.  Neurologic  Senn-Weinstein monofilament wire test within normal limits  bilaterally. Muscle power within normal limits bilaterally.  Nails Thick disfigured discolored nails with subungual debris  from hallux to fifth toes bilaterally. No evidence of bacterial infection or drainage bilaterally.  Orthopedic  No limitations of motion  feet .  No crepitus or effusions noted.  No bony pathology or digital deformities noted.  Skin  normotropic skin with no porokeratosis noted bilaterally.  No signs of infections or ulcers noted.     Onychomycosis  Pain in toes right foot  Pain in toes left foot  Debridement  of nails  1-5  B/L with a nail nipper.  Nails were then filed using a dremel tool with no incidents.    RTC 3 months   Gardiner Barefoot DPM

## 2020-04-05 ENCOUNTER — Encounter (HOSPITAL_COMMUNITY): Payer: Self-pay

## 2020-04-05 ENCOUNTER — Emergency Department (HOSPITAL_COMMUNITY)
Admission: EM | Admit: 2020-04-05 | Discharge: 2020-04-05 | Disposition: A | Discharge status: Home / Self Care | Payer: Medicare Other | Attending: Emergency Medicine | Admitting: Emergency Medicine

## 2020-04-05 ENCOUNTER — Other Ambulatory Visit: Payer: Self-pay

## 2020-04-05 DIAGNOSIS — I10 Essential (primary) hypertension: Secondary | ICD-10-CM | POA: Diagnosis present

## 2020-04-05 DIAGNOSIS — Z5321 Procedure and treatment not carried out due to patient leaving prior to being seen by health care provider: Secondary | ICD-10-CM | POA: Diagnosis not present

## 2020-04-05 NOTE — ED Notes (Signed)
Patient was called to reassess vital signs but no response. x2 

## 2020-04-05 NOTE — ED Triage Notes (Addendum)
Pt arrived via walk in, states his BP has been running high and he believes it is causing him to get dizzy and to "not feel well" . States his Dr has changed his BP recently. Pt denies any current dizziness.

## 2020-04-06 ENCOUNTER — Telehealth: Payer: Self-pay | Admitting: Internal Medicine

## 2020-04-11 NOTE — Telephone Encounter (Signed)
Patient scheduled 10/20/21at 1415 for high dose flu vaccine.

## 2020-04-11 NOTE — Telephone Encounter (Signed)
Pt wanting to schedule flu shot - please call [phone]

## 2020-04-19 ENCOUNTER — Other Ambulatory Visit: Payer: Self-pay

## 2020-04-19 ENCOUNTER — Ambulatory Visit (INDEPENDENT_AMBULATORY_CARE_PROVIDER_SITE_OTHER): Payer: Medicare Other

## 2020-04-19 DIAGNOSIS — Z23 Encounter for immunization: Secondary | ICD-10-CM

## 2020-04-27 ENCOUNTER — Other Ambulatory Visit: Payer: Self-pay

## 2020-04-27 ENCOUNTER — Ambulatory Visit (INDEPENDENT_AMBULATORY_CARE_PROVIDER_SITE_OTHER): Payer: Medicare Other | Admitting: Internal Medicine

## 2020-04-27 ENCOUNTER — Encounter: Payer: Self-pay | Admitting: Internal Medicine

## 2020-04-27 ENCOUNTER — Ambulatory Visit (INDEPENDENT_AMBULATORY_CARE_PROVIDER_SITE_OTHER): Payer: Medicare Other

## 2020-04-27 VITALS — BP 126/72 | HR 76 | Temp 97.6°F | Ht 66.0 in | Wt 183.4 lb

## 2020-04-27 DIAGNOSIS — J441 Chronic obstructive pulmonary disease with (acute) exacerbation: Secondary | ICD-10-CM

## 2020-04-27 DIAGNOSIS — J4540 Moderate persistent asthma, uncomplicated: Secondary | ICD-10-CM

## 2020-04-27 DIAGNOSIS — G4733 Obstructive sleep apnea (adult) (pediatric): Secondary | ICD-10-CM | POA: Diagnosis not present

## 2020-04-27 NOTE — Progress Notes (Signed)
Subjective:    Patient ID: Christopher Shepherd, male    DOB: July 22, 1926, 86 y.o.   MRN: [id number]  HPI male never smoker followed for cough, asthma, allergic rhinitis, obstructive sleep apnea NPSG 04/06/2018-AHI 60.8/hour, desaturation to 91%, body weight 202 pounds --------------------------------------------------------------------------------------------- 08/20/2018- 86 year old male never smoker followed for cough, asthma, allergic rhinitis, OSA BiPAP 12/8/Advanced NPSG 04/06/2018-AHI 60.8/hour, desaturation to 91%, body weight 202 pounds -----had sleep study 04/06/18 but has not received new cpap.  still using old cpap. recent swelling in feet but breathing about the same Body weight today 207 pounds We reviewed his sleep study.  He did not understand that he was being asked to return for a titration study. He continues to use his old BiPAP machine 12/8 with a new mask.    04/27/20- 86 year old male [name], never smoker followed for cough, asthma, allergic rhinitis, OSA, complicated by HTN, Osteoaarthritis,  CPAP titration study 01/15/19- 14 (auto 10-20), min sat 93%, body weight 201 lbs CPAP auto 10-20/ [employer] Download- Body weight today- 183 lbs Covid vax- 2 Moderna Flu vax- had ED in July for orthostasis -----pt states dry cough and pain in waist.pt states blood pressure fluctates up and down Using CPAP- machine is about 84 yrs old w no download. Newer machine got recalled  Has variable dry cough, maybe a little worse with Fall season, but not sick. Has lost weight over past years, and meds adjusted for orthostasis.  Gradual long-term weight loss. Pending cardiology f/u. CXR 1V 7/26 FINDINGS: Midline trachea. Mild cardiomegaly. Atherosclerosis in the transverse aorta. No pleural effusion or pneumothorax. Mild right hemidiaphragm elevation. Clear lungs. IMPRESSION: No acute cardiopulmonary disease.  Review of Systems- see HPI + = positive Constitutional:   +  weight loss, night  sweats, fevers, chills, fatigue, lassitude. HEENT:   No-  headaches, difficulty swallowing, tooth/dental problems, sore throat,       No-  sneezing, itching, ear ache,    nasal congestion, +post nasal drip,  CV:  No-   chest pain, orthopnea, PND, swelling in lower extremities, anasarca,  dizziness, palpitations Resp: No-   shortness of breath with exertion or at rest.               productive cough,   +non-productive cough,  No-  coughing up of blood.               change in color of mucus.   wheezing.   Skin: No-   rash or lesions. GI:  No-   heartburn, indigestion, abdominal pain, nausea, vomiting,  GU:  MS:  No-   joint pain or swelling.   Neuro- nothing unusual  Psych:  No- change in mood or affect. No depression or anxiety.  No memory loss.   Objective:   Physical Exam General- Alert, Oriented, Affect-appropriate, Distress- none acute;  Skin- rash-none, lesions- none, excoriation- none Lymphadenopathy- none Head- atraumatic            Eyes- Gross vision intact, PERRLA, conjunctivae clear secretions            Ears- Hearing, canals-normal            Nose- + turbinate edema, no-Septal dev, mucus, polyps, erosion, perforation             Throat- Mallampati IV , mucosa dry , drainage- none, tonsils- atrophic Neck- flexible , trachea midline, no stridor , thyroid nl, carotid no bruit Chest - symmetrical excursion , unlabored  Heart/CV- RRR with frequent skipped beats , no murmur , no gallop  , no rub, nl s1 s2                           - JVD- none , edema+trace, stasis changes- none, varices- none           Lung- clear, wheeze- none, cough-none , dullness-none, rub- none           Chest wall-  Abd-  Br/ Gen/ Rectal- Not done, not indicated Extrem- cyanosis- none, clubbing, none, atrophy- none, strength- nl Neuro- grossly intact to observation Assessment & Plan:

## 2020-04-27 NOTE — Patient Instructions (Signed)
Order- DME- please replace recalled CPAP machine- using one that is 84 yrs old. Auto 10-20, mask of choice, humidifier, supplies, Airview/ card  Order- CXR    Dx asthmatic bronchitis  Please call if we can help

## 2020-04-30 NOTE — Progress Notes (Signed)
Cardiology Office Note:   Date:  05/01/2020  NAME:  Christopher Shepherd    MRN: [id number] DOB:  Aug 07, 1926   PCP:  Willey Blade, MD  Cardiologist:  No primary care provider on file.   Referring MD: Willey Blade, MD   Chief Complaint  Patient presents with  . Follow-up   History of Present Illness:   Christopher Shepherd is a 86 y.o. male with a hx of 1AVB/PACs, HLD, orthostatic hypotension who presents for follow-up. Was evaluated for syncope that was likely orthostatic in nature. Echo with changes consistent with hypertension.  He reports he is doing well since her last visit.  No recent falls or syncopal events.  He does present with a blood pressure log.  Blood pressure has ranged from 761-607 systolically.  Diastolic values are between 50 and 70 mmHg.  He does report intermittent dizziness and lightheadedness upon standing.  He was found to be orthostatic in the past.  Blood pressure is 147/66.  He denies any symptoms of chest pain or shortness of breath.  He does suffer from poor circulation.  He does wear compression stockings intermittently.  I advised him to do this.  I also advised him to stand up slowly.  He is currently on metoprolol as well as losartan.  Blood pressure seems to be okay.  He is on Crestor 20 mg daily.  I did review his echocardiogram.  Overall he has grade 1 diastolic dysfunction.  It was mention is having grade 2.  He has no elevated pulmonary pressures.  Left atrium is mildly dilated.  E/e' prime is within normal limits.  Problem List 1. Advanced age 20. Orthostatic hypotension  3. HTN 4. HLD 5. PACs 6. 1AVB 7. OSA  Past Medical History: Past Medical History:  Diagnosis Date  . Allergic rhinitis   . Anemia   . Arthritis    osteoarthritis  . Asthma 1994   with bronchitis  . Cough   . Hyperlipidemia   . Hypertension   . Obstructive sleep apnea    uses a C-Pap    Past Surgical History: Past Surgical History:  Procedure Laterality Date  .  COLONOSCOPY W/ POLYPECTOMY    . none    . TOTAL KNEE ARTHROPLASTY Right 10/04/2013   Procedure: RIGHT TOTAL KNEE ARTHROPLASTY;  Surgeon: Gearlean Alf, MD;  Location: [facility];  Service: Orthopedics;  Laterality: Right;    Current Medications: Current Meds  Medication Sig  . acetaminophen (TYLENOL 8 HOUR ARTHRITIS PAIN) 650 MG CR tablet Take 650 mg by mouth every 8 (eight) hours as needed for pain.  . Cholecalciferol (VITAMIN D-3) 1000 units CAPS Take 1 capsule by mouth daily.   . Coenzyme Q10 (CO Q 10 PO) Co Q-10 300 mg capsule  Take 1 capsule every day by oral route.  . hydrALAZINE (APRESOLINE) 25 MG tablet Take 25 mg by mouth 2 (two) times daily.  Marland Kitchen losartan (COZAAR) 100 MG tablet Take 100 mg by mouth every morning.  . metoprolol succinate (TOPROL-XL) 25 MG 24 hr tablet Take 12.5 mg by mouth daily.   . mometasone (NASONEX) 50 MCG/ACT nasal spray 1-2 sprays each nostril daily at bedtime while needed (Patient taking differently: Place 1-2 sprays into the nose at bedtime as needed (allergies). )  . Omega 3 1000 MG CAPS Take 1 capsule by mouth.   . rosuvastatin (CRESTOR) 20 MG tablet Take 10 mg by mouth every evening.   . vitamin B-12 (CYANOCOBALAMIN) 100 MCG tablet Take 100 mcg by  mouth daily.     Allergies:    Patient has no known allergies.   Social History: Social History   Socioeconomic History  . Marital status: Married    Spouse name: Not on file  . Number of children: 2  . Years of education: Not on file  . Highest education level: Not on file  Occupational History  . Not on file  Tobacco Use  . Smoking status: Former Smoker    Packs/day: 0.20    Years: 15.00    Pack years: 3.00    Types: Cigarettes    Quit date: 07/01/1968    Years since quitting: 51.8  . Smokeless tobacco: Former Systems developer  . Tobacco comment: Quit 50 yrs ago as of 2014  Substance and Sexual Activity  . Alcohol use: No    Alcohol/week: 3.0 standard drinks    Types: 3 Cans of beer per week    Comment:  occasionally  . Drug use: No  . Sexual activity: Not on file  Other Topics Concern  . Not on file  Social History Narrative  . Not on file   Social Determinants of Health   Financial Resource Strain:   . Difficulty of Paying Living Expenses: Not on file  Food Insecurity:   . Worried About Charity fundraiser in the Last Year: Not on file  . Ran Out of Food in the Last Year: Not on file  Transportation Needs:   . Lack of Transportation (Medical): Not on file  . Lack of Transportation (Non-Medical): Not on file  Physical Activity:   . Days of Exercise per Week: Not on file  . Minutes of Exercise per Session: Not on file  Stress:   . Feeling of Stress : Not on file  Social Connections:   . Frequency of Communication with Friends and Family: Not on file  . Frequency of Social Gatherings with Friends and Family: Not on file  . Attends Religious Services: Not on file  . Active Member of Clubs or Organizations: Not on file  . Attends Archivist Meetings: Not on file  . Marital Status: Not on file     Family History: The patient's family history includes Cancer in his father; Colon cancer in his mother.  ROS:   All other ROS reviewed and negative. Pertinent positives noted in the HPI.     EKGs/Labs/Other Studies Reviewed:   The following studies were personally reviewed by me today:  TTE 02/17/2020 1. Left ventricular ejection fraction, by estimation, is 55 to 60%. The  left ventricle has normal function. The left ventricle has no regional  wall motion abnormalities. There is moderate eccentric left ventricular  hypertrophy. Left ventricular  diastolic parameters are consistent with Grade II diastolic dysfunction  (pseudonormalization).  2. Right ventricular systolic function is normal. The right ventricular  size is normal. There is normal pulmonary artery systolic pressure.  3. Left atrial size was moderately dilated.  4. Right atrial size was moderately  dilated.  5. The mitral valve is normal in structure. Trivial mitral valve  regurgitation.  6. The aortic valve is tricuspid. Aortic valve regurgitation is not  visualized. No aortic stenosis is present.  7. The inferior vena cava is normal in size with greater than 50%  respiratory variability, suggesting right atrial pressure of 3 mmHg.   Recent Labs: 01/24/2020: ALT 20; BUN 19; Creatinine, Ser 1.46; Hemoglobin 12.5; Platelets 159; Potassium 4.0; Sodium 140   Recent Lipid Panel No results found for:  CHOL, TRIG, HDL, CHOLHDL, VLDL, LDLCALC, LDLDIRECT  Physical Exam:   VS:  BP (!) 147/66   Pulse 70   Ht 5\' 6"  (1.676 m)   Wt 183 lb 6.4 oz (83.2 kg)   SpO2 99%   BMI 29.60 kg/m    Wt Readings from Last 3 Encounters:  05/01/20 183 lb 6.4 oz (83.2 kg)  04/27/20 183 lb 6.4 oz (83.2 kg)  01/28/20 184 lb 12.8 oz (83.8 kg)    General: Well nourished, well developed, in no acute distress Heart: Atraumatic, normal size  Eyes: PEERLA, EOMI  Neck: Supple, no JVD Endocrine: No thryomegaly Cardiac: Normal S1, S2; RRR; no murmurs, rubs, or gallops Lungs: Clear to auscultation bilaterally, no wheezing, rhonchi or rales  Abd: Soft, nontender, no hepatomegaly  Ext: No edema, pulses 2+ Musculoskeletal: No deformities, BUE and BLE strength normal and equal Skin: Warm and dry, no rashes   Neuro: Alert and oriented to person, place, time, and situation, CNII-XII grossly intact, no focal deficits  Psych: Normal mood and affect   ASSESSMENT:   Christopher Shepherd is a 86 y.o. male who presents for the following: 1. Syncope and collapse   2. Dizziness   3. Orthostatic hypotension   4. PAC (premature atrial contraction)   5. 1st degree AV block   6. Essential hypertension, benign   7. Venous insufficiency     PLAN:   1. Syncope and collapse 2. Dizziness 3. Orthostatic hypotension -Seen several months ago for syncope.  Symptoms were more concerning for orthostatic hypotension.  While in  the emergency room he was orthostatic.  Symptoms occur on a hot day.  He had no red flag symptoms before or after the event.  We did stop his Norvasc at that time.  He had no further recurrence.  I recommended to stay well-hydrated.  Also recommended to stand up slowly.  His echocardiogram demonstrated moderate concentric LVH with grade 1 diastolic dysfunction.  He had no elevated pulmonary pressures.  He has no evidence of heart failure examination.  His EKG demonstrated first-degree AV block with PACs.  No worsening symptoms for underlying conduction disease.  4. PAC (premature atrial contraction) 5. 1st degree AV block -No symptoms.  Continue metoprolol.  6. Essential hypertension, benign -BP appears to be stable.  Continue metoprolol succinate 25 mg daily and losartan 100 mg daily.  He takes hydralazine 25 mg twice daily.  BPs have been acceptable on review of log in office today.  7. Venous insufficiency -Would recommend compression stockings.  Disposition: Return in about 1 year (around 05/01/2021).  Medication Adjustments/Labs and Tests Ordered: Current medicines are reviewed at length with the patient today.  Concerns regarding medicines are outlined above.  No orders of the defined types were placed in this encounter.  No orders of the defined types were placed in this encounter.   Patient Instructions  Medication Instructions:  Continue same medications *If you need a refill on your cardiac medications before your next appointment, please call your pharmacy*   Lab Work: None Ordered   Testing/Procedures: Compression Hose minimal compression  [facility] on [address] or [employer] )    Follow-Up: At [facility], you and your health needs are our priority.  As part of our continuing mission to provide you with exceptional heart care, we have created designated Provider Care Teams.  These Care Teams include your primary Cardiologist (physician) and  Advanced Practice Providers (APPs -  Physician Assistants and Nurse Practitioners) who  all work together to provide you with the care you need, when you need it.  We recommend signing up for the patient portal called "MyChart".  Sign up information is provided on this After Visit Summary.  MyChart is used to connect with patients for Virtual Visits (Telemedicine).  Patients are able to view lab/test results, encounter notes, upcoming appointments, etc.  Non-urgent messages can be sent to your provider as well.   To learn more about what you can do with MyChart, go to [email or web address].    Your next appointment:  1 year    Call in August to schedule Nov appointment    The format for your next appointment: Office   Provider:  Dr.O'Neal      Time Spent with Patient: I have spent a total of 25 minutes with patient reviewing hospital notes, telemetry, EKGs, labs and examining the patient as well as establishing an assessment and plan that was discussed with the patient.  > 50% of time was spent in direct patient care.  Signed, Addison Naegeli. Audie Box, [place]  [address] [place], [place] [id number] [phone]  05/01/2020 1:54 PM

## 2020-05-01 ENCOUNTER — Encounter: Payer: Self-pay | Admitting: Cardiovascular Disease

## 2020-05-01 ENCOUNTER — Ambulatory Visit (INDEPENDENT_AMBULATORY_CARE_PROVIDER_SITE_OTHER): Payer: Medicare Other | Admitting: Cardiovascular Disease

## 2020-05-01 ENCOUNTER — Other Ambulatory Visit: Payer: Self-pay

## 2020-05-01 VITALS — BP 147/66 | HR 70 | Ht 66.0 in | Wt 183.4 lb

## 2020-05-01 DIAGNOSIS — I440 Atrioventricular block, first degree: Secondary | ICD-10-CM

## 2020-05-01 DIAGNOSIS — I491 Atrial premature depolarization: Secondary | ICD-10-CM

## 2020-05-01 DIAGNOSIS — R55 Syncope and collapse: Secondary | ICD-10-CM

## 2020-05-01 DIAGNOSIS — I10 Essential (primary) hypertension: Secondary | ICD-10-CM

## 2020-05-01 DIAGNOSIS — R42 Dizziness and giddiness: Secondary | ICD-10-CM

## 2020-05-01 DIAGNOSIS — I951 Orthostatic hypotension: Secondary | ICD-10-CM

## 2020-05-01 DIAGNOSIS — I872 Venous insufficiency (chronic) (peripheral): Secondary | ICD-10-CM

## 2020-05-01 NOTE — Assessment & Plan Note (Signed)
Dry cough has persisted, not dramatic. Not much wheeze. Discussed med options Plan- CXR

## 2020-05-01 NOTE — Assessment & Plan Note (Signed)
Appropriate to replace old machine, continuing auto 10-20

## 2020-05-01 NOTE — Patient Instructions (Signed)
Medication Instructions:  Continue same medications *If you need a refill on your cardiac medications before your next appointment, please call your pharmacy*   Lab Work: None Ordered   Testing/Procedures: Compression Hose minimal compression  [facility] on [address] or [employer] )    Follow-Up: At [facility], you and your health needs are our priority.  As part of our continuing mission to provide you with exceptional heart care, we have created designated Provider Care Teams.  These Care Teams include your primary Cardiologist (physician) and Advanced Practice Providers (APPs -  Physician Assistants and Nurse Practitioners) who all work together to provide you with the care you need, when you need it.  We recommend signing up for the patient portal called "MyChart".  Sign up information is provided on this After Visit Summary.  MyChart is used to connect with patients for Virtual Visits (Telemedicine).  Patients are able to view lab/test results, encounter notes, upcoming appointments, etc.  Non-urgent messages can be sent to your provider as well.   To learn more about what you can do with MyChart, go to [email or web address].    Your next appointment:  1 year    Call in August to schedule Nov appointment    The format for your next appointment: Office   Provider:  Dr.O'Neal

## 2020-05-02 ENCOUNTER — Telehealth: Payer: Self-pay

## 2020-05-02 NOTE — Telephone Encounter (Signed)
LMTCB and will hold in triage since SB created this msg

## 2020-05-02 NOTE — Telephone Encounter (Signed)
Patient would like to discuss Xray in two weeks. Patient phone number is [phone].

## 2020-05-02 NOTE — Telephone Encounter (Signed)
The recent chest xray showed a hazy area that might be nothing, or might be some inflammation. As long as he doesn't feel sick, I want to give this time to go away on its own if it can. 2 weeks is very reasonable for this.

## 2020-05-02 NOTE — Telephone Encounter (Signed)
Spoke with the pt and notified of recs per Dr Annamaria Boots  Pt verbalized understanding Nothing further needed

## 2020-05-02 NOTE — Telephone Encounter (Signed)
Spoke with the pt  He states he understands that Dr Annamaria Boots wants another cxr on him in 2 wks but he is asking why He feels like 2 wks is a long time and wants to know why he wants him to wait 2 wks  Dr Annamaria Boots can you please advise on this, thanks!

## 2020-05-02 NOTE — Telephone Encounter (Signed)
Pt was called given results of cxr and informed to come back in 2 weeks for repeat cxr . Pt verbalized understanding and order has been placed in chart

## 2020-05-12 ENCOUNTER — Ambulatory Visit (INDEPENDENT_AMBULATORY_CARE_PROVIDER_SITE_OTHER): Payer: Medicare Other

## 2020-05-12 DIAGNOSIS — J4540 Moderate persistent asthma, uncomplicated: Secondary | ICD-10-CM | POA: Diagnosis not present

## 2020-05-16 ENCOUNTER — Other Ambulatory Visit: Payer: Self-pay | Admitting: *Deleted

## 2020-05-16 DIAGNOSIS — R918 Other nonspecific abnormal finding of lung field: Secondary | ICD-10-CM

## 2020-05-26 ENCOUNTER — Encounter (HOSPITAL_COMMUNITY): Payer: Self-pay

## 2020-05-26 ENCOUNTER — Ambulatory Visit (HOSPITAL_COMMUNITY)
Admission: RE | Admit: 2020-05-26 | Discharge: 2020-05-26 | Disposition: A | Discharge status: Home / Self Care | Payer: Medicare Other | Source: Ambulatory Visit | Attending: Internal Medicine | Admitting: Internal Medicine

## 2020-05-26 ENCOUNTER — Other Ambulatory Visit: Payer: Self-pay

## 2020-05-26 DIAGNOSIS — R918 Other nonspecific abnormal finding of lung field: Secondary | ICD-10-CM | POA: Insufficient documentation

## 2020-05-29 DIAGNOSIS — I1310 Hypertensive heart and chronic kidney disease without heart failure, with stage 1 through stage 4 chronic kidney disease, or unspecified chronic kidney disease: Secondary | ICD-10-CM | POA: Insufficient documentation

## 2020-05-31 ENCOUNTER — Other Ambulatory Visit: Payer: Self-pay | Admitting: Internal Medicine

## 2020-05-31 DIAGNOSIS — R911 Solitary pulmonary nodule: Secondary | ICD-10-CM

## 2020-05-31 NOTE — Progress Notes (Signed)
I spoke with the pt and notified of results and he was okay with repeating the scan in 3 months and I went ahead and placed order. Copy of CT Report faxed to Dr Karlton Lemon.

## 2020-07-04 ENCOUNTER — Ambulatory Visit (INDEPENDENT_AMBULATORY_CARE_PROVIDER_SITE_OTHER): Payer: Medicare Other | Admitting: Podiatry

## 2020-07-04 ENCOUNTER — Encounter: Payer: Self-pay | Admitting: Podiatry

## 2020-07-04 ENCOUNTER — Other Ambulatory Visit: Payer: Self-pay

## 2020-07-04 DIAGNOSIS — B351 Tinea unguium: Secondary | ICD-10-CM | POA: Diagnosis not present

## 2020-07-04 NOTE — Progress Notes (Signed)
This patient returns to the office for evaluation and treatment of long thick painful nails .  This patient is unable to trim his own nails since the patient cannot reach his feet.  Patient says the nails are painful walking and wearing his shoes.  He returns for preventive foot care services.  General Appearance  Alert, conversant and in no acute stress.  Vascular  Dorsalis pedis and posterior tibial  pulses are weakly  palpable  bilaterally.  Capillary return is within normal limits  bilaterally. Temperature is within normal limits  bilaterally.  Neurologic  Senn-Weinstein monofilament wire test within normal limits  bilaterally. Muscle power within normal limits bilaterally.  Nails Thick disfigured discolored nails with subungual debris  from hallux to fifth toes bilaterally. No evidence of bacterial infection or drainage bilaterally.  Orthopedic  No limitations of motion  feet .  No crepitus or effusions noted.  No bony pathology or digital deformities noted.  HAV  B/L.  Skin  normotropic skin with no porokeratosis noted bilaterally.  No signs of infections or ulcers noted.     Onychomycosis  Pain in toes right foot  Pain in toes left foot  Debridement  of nails  1-5  B/L with a nail nipper.  Nails were then filed using a dremel tool with no incidents.    RTC 10 weeks   Gardiner Barefoot DPM

## 2020-08-31 ENCOUNTER — Ambulatory Visit
Admission: RE | Admit: 2020-08-31 | Discharge: 2020-08-31 | Disposition: A | Discharge status: Home / Self Care | Payer: Medicare Other | Source: Ambulatory Visit | Attending: Internal Medicine | Admitting: Internal Medicine

## 2020-08-31 DIAGNOSIS — R911 Solitary pulmonary nodule: Secondary | ICD-10-CM

## 2020-09-01 NOTE — Progress Notes (Signed)
Called and went over CT results per Dr Annamaria Boots with patient. All questions answered and patient expressed full understanding. Scheduled office visit with Dr Annamaria Boots per patient request for Tuesday 09/05/2020 at 9:30am. Patient agreeable to time, date and location. Nothing further needed at this time.

## 2020-09-04 NOTE — Progress Notes (Signed)
Subjective:    Patient ID: Christopher Shepherd, male    DOB: 1927/05/19, 87 y.o.   MRN: [id number]  HPI male never smoker followed for cough, asthma, allergic rhinitis, obstructive sleep apnea NPSG 04/06/2018-AHI 60.8/hour, desaturation to 91%, body weight 202 pounds ---------------------------------------------------------------------------------------------   04/27/20- 87 year old male [name], never smoker followed for cough, asthma, allergic rhinitis, OSA, complicated by HTN, Osteoaarthritis,  CPAP titration study 01/15/19- 14 (auto 10-20), min sat 93%, body weight 201 lbs CPAP auto 10-20/ [employer] Download- Body weight today- 183 lbs Covid vax- 2 Moderna Flu vax- had ED in July for orthostasis -----pt states dry cough and pain in waist.pt states blood pressure fluctates up and down Using CPAP- machine is about 85 yrs old w no download. Newer machine got recalled  Has variable dry cough, maybe a little worse with Fall season, but not sick. Has lost weight over past years, and meds adjusted for orthostasis.  Gradual long-term weight loss. Pending cardiology f/u. CXR 1V 7/26 FINDINGS: Midline trachea. Mild cardiomegaly. Atherosclerosis in the transverse aorta. No pleural effusion or pneumothorax. Mild right hemidiaphragm elevation. Clear lungs. IMPRESSION: No acute cardiopulmonary disease.  09/05/20- 87 year old male [name], never smoker followed for Cough, Asthma, Allergic Rhinitis, OSA, Lung Nodule, complicated by HTN, Osteoarthritis, Aortic Atherosclerosis, Orthostatic Hypotension,  CPAP auto 10-20/ [facility]    Replaced March, 2022  [employer] AirSense 10 Download- Body weight today 182 lbs Covid vax-3 Moderna Flu vax-had ECHO 02/17/20- did not suggest pulmonary hypertension -----Patient states that he is sleeps good, has new machine and just got new supplies and has not been using machine. Wants to talk about CT scan one on 3/3/022 He agrees to start using replacement machine now.   CT reviewed with discussion of possible PAH. He feels well and denies chest symptoms. I suggested at age 56, conservative observation is reasonable. CT chest 08/31/20- IMPRESSION: 1. Interval resolution of a flattened, nodular density of the inferior right upper lobe abutting the minor fissure, consistent with resolved infection or inflammation. No further follow-up required. 2. Gross enlargement of the main pulmonary artery, measuring up to 4.2 cm, as can be seen in pulmonary hypertension. 3. Coronary artery disease. Aortic Atherosclerosis (ICD10-I70.0).   Review of Systems- see HPI + = positive Constitutional:   +  weight loss, night sweats, fevers, chills, fatigue, lassitude. HEENT:   No-  headaches, difficulty swallowing, tooth/dental problems, sore throat,       No-  sneezing, itching, ear ache,    nasal congestion, +post nasal drip,  CV:  No-   chest pain, orthopnea, PND, swelling in lower extremities, anasarca,  dizziness, palpitations Resp: No-   shortness of breath with exertion or at rest.               productive cough,   +non-productive cough,  No-  coughing up of blood.               change in color of mucus.   wheezing.   Skin: No-   rash or lesions. GI:  No-   heartburn, indigestion, abdominal pain, nausea, vomiting,  GU:  MS:  No-   joint pain or swelling.   Neuro- nothing unusual  Psych:  No- change in mood or affect. No depression or anxiety.  No memory loss.   Objective:   Physical Exam General- Alert, Oriented, Affect-appropriate, Distress- none acute;  Skin- rash-none, lesions- none, excoriation- none Lymphadenopathy- none Head- atraumatic            Eyes- Gross vision  intact, PERRLA, conjunctivae clear secretions            Ears- Hearing, canals-normal            Nose- + turbinate edema, no-Septal dev, mucus, polyps, erosion, perforation             Throat- Mallampati IV , mucosa dry , drainage- none, tonsils- atrophic Neck- flexible , trachea midline, no  stridor , thyroid nl, carotid no bruit Chest - symmetrical excursion , unlabored           Heart/CV- RRR with frequent skipped beats , no murmur , no gallop  , no rub, nl s1 s2                           - JVD- none , edema+trace, stasis changes- none, varices- none           Lung- clear, wheeze- none, cough-none , dullness-none, rub- none           Chest wall-  Abd-  Br/ Gen/ Rectal- Not done, not indicated Extrem- cyanosis- none, clubbing, none, atrophy- none, strength- nl Neuro- grossly intact to observation Assessment & Plan:

## 2020-09-05 ENCOUNTER — Encounter: Payer: Self-pay | Admitting: Internal Medicine

## 2020-09-05 ENCOUNTER — Other Ambulatory Visit: Payer: Self-pay

## 2020-09-05 ENCOUNTER — Ambulatory Visit (INDEPENDENT_AMBULATORY_CARE_PROVIDER_SITE_OTHER): Payer: Medicare Other | Admitting: Internal Medicine

## 2020-09-05 DIAGNOSIS — J45998 Other asthma: Secondary | ICD-10-CM

## 2020-09-05 DIAGNOSIS — G4733 Obstructive sleep apnea (adult) (pediatric): Secondary | ICD-10-CM

## 2020-09-05 NOTE — Patient Instructions (Signed)
I'm glad we got a good report with this CT scan.  You can go ahead and use your CPAP routinely.  Please call if we can help

## 2020-09-06 ENCOUNTER — Ambulatory Visit (INDEPENDENT_AMBULATORY_CARE_PROVIDER_SITE_OTHER): Payer: Medicare Other | Admitting: Podiatry

## 2020-09-06 ENCOUNTER — Encounter: Payer: Self-pay | Admitting: Podiatry

## 2020-09-06 DIAGNOSIS — R632 Polyphagia: Secondary | ICD-10-CM | POA: Insufficient documentation

## 2020-09-06 DIAGNOSIS — R7309 Other abnormal glucose: Secondary | ICD-10-CM | POA: Insufficient documentation

## 2020-09-06 DIAGNOSIS — M1710 Unilateral primary osteoarthritis, unspecified knee: Secondary | ICD-10-CM | POA: Insufficient documentation

## 2020-09-06 DIAGNOSIS — B351 Tinea unguium: Secondary | ICD-10-CM

## 2020-09-06 DIAGNOSIS — R609 Edema, unspecified: Secondary | ICD-10-CM | POA: Insufficient documentation

## 2020-09-06 DIAGNOSIS — N189 Chronic kidney disease, unspecified: Secondary | ICD-10-CM | POA: Insufficient documentation

## 2020-09-06 DIAGNOSIS — E669 Obesity, unspecified: Secondary | ICD-10-CM | POA: Insufficient documentation

## 2020-09-06 DIAGNOSIS — Z96659 Presence of unspecified artificial knee joint: Secondary | ICD-10-CM | POA: Insufficient documentation

## 2020-09-06 NOTE — Progress Notes (Signed)
This patient returns to the office for evaluation and treatment of long thick painful nails .  This patient is unable to trim his own nails since the patient cannot reach his feet.  Patient says the nails are painful walking and wearing his shoes.  He returns for preventive foot care services.  General Appearance  Alert, conversant and in no acute stress.  Vascular  Dorsalis pedis and posterior tibial  pulses are weakly  palpable  bilaterally.  Capillary return is within normal limits  bilaterally. Temperature is within normal limits  bilaterally.  Neurologic  Senn-Weinstein monofilament wire test within normal limits  bilaterally. Muscle power within normal limits bilaterally.  Nails Thick disfigured discolored nails with subungual debris  from hallux to fifth toes bilaterally. No evidence of bacterial infection or drainage bilaterally.  Orthopedic  No limitations of motion  feet .  No crepitus or effusions noted.  No bony pathology or digital deformities noted.  HAV  B/L.  Skin  normotropic skin with no porokeratosis noted bilaterally.  No signs of infections or ulcers noted.     Onychomycosis  Pain in toes right foot  Pain in toes left foot  Debridement  of nails  1-5  B/L with a nail nipper.  Nails were then filed using a dremel tool with no incidents.    RTC 10 weeks   Gardiner Barefoot DPM

## 2020-09-16 ENCOUNTER — Emergency Department (HOSPITAL_COMMUNITY)
Admission: EM | Admit: 2020-09-16 | Discharge: 2020-09-16 | Disposition: A | Discharge status: Home / Self Care | Payer: Medicare Other | Attending: Emergency Medicine | Admitting: Emergency Medicine

## 2020-09-16 ENCOUNTER — Other Ambulatory Visit: Payer: Self-pay

## 2020-09-16 ENCOUNTER — Encounter (HOSPITAL_COMMUNITY): Payer: Self-pay

## 2020-09-16 DIAGNOSIS — R600 Localized edema: Secondary | ICD-10-CM

## 2020-09-16 DIAGNOSIS — Z20822 Contact with and (suspected) exposure to covid-19: Secondary | ICD-10-CM | POA: Diagnosis not present

## 2020-09-16 DIAGNOSIS — Z79899 Other long term (current) drug therapy: Secondary | ICD-10-CM | POA: Diagnosis not present

## 2020-09-16 DIAGNOSIS — I129 Hypertensive chronic kidney disease with stage 1 through stage 4 chronic kidney disease, or unspecified chronic kidney disease: Secondary | ICD-10-CM | POA: Diagnosis not present

## 2020-09-16 DIAGNOSIS — N189 Chronic kidney disease, unspecified: Secondary | ICD-10-CM | POA: Diagnosis not present

## 2020-09-16 DIAGNOSIS — Z96651 Presence of right artificial knee joint: Secondary | ICD-10-CM | POA: Diagnosis not present

## 2020-09-16 DIAGNOSIS — J45909 Unspecified asthma, uncomplicated: Secondary | ICD-10-CM | POA: Insufficient documentation

## 2020-09-16 DIAGNOSIS — Z87891 Personal history of nicotine dependence: Secondary | ICD-10-CM | POA: Insufficient documentation

## 2020-09-16 DIAGNOSIS — M7989 Other specified soft tissue disorders: Secondary | ICD-10-CM | POA: Diagnosis present

## 2020-09-16 DIAGNOSIS — R609 Edema, unspecified: Secondary | ICD-10-CM | POA: Insufficient documentation

## 2020-09-16 DIAGNOSIS — R599 Enlarged lymph nodes, unspecified: Secondary | ICD-10-CM | POA: Insufficient documentation

## 2020-09-16 DIAGNOSIS — R030 Elevated blood-pressure reading, without diagnosis of hypertension: Secondary | ICD-10-CM

## 2020-09-16 LAB — URINALYSIS, ROUTINE W REFLEX MICROSCOPIC
Bilirubin Urine: NEGATIVE
Glucose, UA: NEGATIVE mg/dL
Hgb urine dipstick: NEGATIVE
Ketones, ur: NEGATIVE mg/dL
Leukocytes,Ua: NEGATIVE
Nitrite: NEGATIVE
Protein, ur: NEGATIVE mg/dL
Specific Gravity, Urine: 1.008 (ref 1.005–1.030)
pH: 6 (ref 5.0–8.0)

## 2020-09-16 LAB — COMPREHENSIVE METABOLIC PANEL
ALT: 20 U/L (ref 0–44)
AST: 18 U/L (ref 15–41)
Albumin: 3.7 g/dL (ref 3.5–5.0)
Alkaline Phosphatase: 52 U/L (ref 38–126)
Anion gap: 7 (ref 5–15)
BUN: 16 mg/dL (ref 8–23)
CO2: 26 mmol/L (ref 22–32)
Calcium: 8.9 mg/dL (ref 8.9–10.3)
Chloride: 110 mmol/L (ref 98–111)
Creatinine, Ser: 1.27 mg/dL — ABNORMAL HIGH (ref 0.61–1.24)
GFR, Estimated: 53 mL/min — ABNORMAL LOW (ref >60)
Glucose, Bld: 96 mg/dL (ref 70–99)
Potassium: 4 mmol/L (ref 3.5–5.1)
Sodium: 143 mmol/L (ref 135–145)
Total Bilirubin: 1.3 mg/dL — ABNORMAL HIGH (ref 0.3–1.2)
Total Protein: 6.8 g/dL (ref 6.5–8.1)

## 2020-09-16 LAB — CBC WITH DIFFERENTIAL/PLATELET
Abs Immature Granulocytes: 0.01 10*3/uL (ref 0.00–0.07)
Basophils Absolute: 0 10*3/uL (ref 0.0–0.1)
Basophils Relative: 0 %
Eosinophils Absolute: 0.1 10*3/uL (ref 0.0–0.5)
Eosinophils Relative: 2 %
HCT: 37.5 % — ABNORMAL LOW (ref 39.0–52.0)
Hemoglobin: 11.8 g/dL — ABNORMAL LOW (ref 13.0–17.0)
Immature Granulocytes: 0 %
Lymphocytes Relative: 27 %
Lymphs Abs: 1.3 10*3/uL (ref 0.7–4.0)
MCH: 28.4 pg (ref 26.0–34.0)
MCHC: 31.5 g/dL (ref 30.0–36.0)
MCV: 90.4 fL (ref 80.0–100.0)
Monocytes Absolute: 0.6 10*3/uL (ref 0.1–1.0)
Monocytes Relative: 11 %
Neutro Abs: 2.9 10*3/uL (ref 1.7–7.7)
Neutrophils Relative %: 60 %
Platelets: 133 10*3/uL — ABNORMAL LOW (ref 150–400)
RBC: 4.15 MIL/uL — ABNORMAL LOW (ref 4.22–5.81)
RDW: 14 % (ref 11.5–15.5)
WBC: 4.9 10*3/uL (ref 4.0–10.5)
nRBC: 0 % (ref 0.0–0.2)

## 2020-09-16 MED ORDER — HYDRALAZINE HCL 25 MG PO TABS
50.0000 mg | ORAL_TABLET | Freq: Once | ORAL | Status: AC
  Administered 2020-09-16: 50 mg via ORAL
  Filled 2020-09-16: qty 2

## 2020-09-16 NOTE — Discharge Instructions (Addendum)
Work-up here was reassuring. Please remember to take the hydralazine 3 times daily.  For the next 3 days, you may take 2 tablets (40 mg) of the furosemide instead of 1 tablet (20 mg).  Follow-up with your primary care provider on this matter. Return to the emergency department for chest pain, shortness of breath, dizziness, passing out, persistent vomiting, abdominal pain, or any other major concerns.

## 2020-09-16 NOTE — ED Provider Notes (Addendum)
[place] DEPT Provider Note   CSN: [id number] Arrival date & time: 09/16/20  1427     History Chief Complaint  Patient presents with   Leg Swelling    Christopher Shepherd is a 87 y.o. male.  HPI      Christopher Shepherd is a 87 y.o. male, with a history of anemia, asthma, cough, hyperlipidemia, HTN, CKD, presenting to the ED with lower extremity swelling intermittent for at least the last couple years. He has also noted intermittent increase in his blood pressure over the last few weeks. He came in today because these issues occurred simultaneously this morning.  He states his PCP on the non-[facility] side increased his hydralazine to be taken 50 mg 3 times daily, increased from 2 times daily, however, his [place] provider was telling him to take the hydralazine twice daily.  He was not clear on whose advice to follow so he continued taking hydralazine twice daily. He has also been prescribed 20 mg Lasix daily and has been compliant with this medication. His legs are not more swollen than normal, he just became worried when his blood pressure was higher and his leg swelling occurred together. He states his leg swelling typically will go down if he lays on his bed with his feet on his head rest. He is requesting a Covid test, though he does not have any persistent symptoms.  He has Covid vaccination x3.   Denies fever/chills, cough, shortness of breath, chest pain, lower extremity pain, calf swelling, difficulty urinating, abdominal pain, flank/back pain, dizziness, syncope, headache, or any other complaints.   Past Medical History:  Diagnosis Date   Allergic rhinitis    Anemia    Arthritis    osteoarthritis   Asthma 1994   with bronchitis   Cough    Hyperlipidemia    Hypertension    Obstructive sleep apnea    uses a C-Pap    Patient Active Problem List   Diagnosis Date Noted   Abnormal glucose level 09/06/2020   Artificial knee joint  present 09/06/2020   Chronic kidney disease 09/06/2020   Edema 09/06/2020   Obesity 09/06/2020   Polyphagia 09/06/2020   Primary localized osteoarthrosis, lower leg 09/06/2020   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   Chronic asthmatic bronchitis with acute exacerbation ([place]) 07/16/2014   Pain in lower limb 11/24/2013   S/P total knee arthroplasty 10/23/2013   Hyperlipidemia 10/08/2013   Constipation 10/08/2013   Essential hypertension, benign 10/08/2013   Hiccups 10/08/2013   Acute blood loss anemia 10/06/2013   OA (osteoarthritis) of knee 10/04/2013   Onychomycosis 10/30/2012   Pain in joint, ankle and foot 10/30/2012   Allergic rhinitis due to pollen 09/05/2010   Vitamin D deficiency 03/03/2009   Obstructive sleep apnea 04/16/2007   Allergic-infective asthma 04/16/2007   COUGH 04/16/2007   History of colonic polyps 08/06/2005    Past Surgical History:  Procedure Laterality Date   COLONOSCOPY W/ POLYPECTOMY     none     TOTAL KNEE ARTHROPLASTY Right 10/04/2013   Procedure: RIGHT TOTAL KNEE ARTHROPLASTY;  Surgeon: Gearlean Alf, MD;  Location: [facility];  Service: Orthopedics;  Laterality: Right;       Family History  Problem Relation Age of Onset   Cancer Father        cause of death   Colon cancer Mother        cause of death    Social History   Tobacco Use  Smoking status: Former Smoker    Packs/day: 0.20    Years: 15.00    Pack years: 3.00    Types: Cigarettes    Quit date: 07/01/1968    Years since quitting: 52.2   Smokeless tobacco: Former Systems developer   Tobacco comment: Quit 50 yrs ago as of 2014  Vaping Use   Vaping Use: Never used  Substance Use Topics   Alcohol use: No    Alcohol/week: 3.0 standard drinks    Types: 3 Cans of beer per week    Comment: occasionally   Drug use: No    Home Medications Prior to Admission medications   Medication Sig Start Date End Date Taking? Authorizing Provider   acetaminophen (TYLENOL) 650 MG CR tablet Take 650 mg by mouth every 8 (eight) hours as needed for pain.    [provider]  amLODipine (NORVASC) 10 MG tablet amlodipine 10 mg tablet  Take 1 tablet every day by oral route.    [provider]  chlorhexidine (PERIDEX) 0.12 % solution chlorhexidine gluconate 0.12 % mouthwash    [provider]  Cholecalciferol (VITAMIN D-3) 1000 units CAPS Take 1 capsule by mouth daily.     [provider]  Coenzyme Q10 (CO Q 10 PO) Co Q-10 300 mg capsule  Take 1 capsule every day by oral route.    [provider]  furosemide (LASIX) 20 MG tablet Take 1 tablet by mouth daily. 08/23/20   [provider]  hydrALAZINE (APRESOLINE) 25 MG tablet Take 50 mg by mouth 2 (two) times daily. 03/10/20   [provider]  losartan (COZAAR) 100 MG tablet Take 100 mg by mouth every morning.    [provider]  metoprolol succinate (TOPROL-XL) 25 MG 24 hr tablet Take 12.5 mg by mouth daily.     [provider]  mometasone (NASONEX) 50 MCG/ACT nasal spray 1-2 sprays each nostril daily at bedtime while needed Patient taking differently: Place 1-2 sprays into the nose at bedtime as needed (allergies). 08/12/16   Baird Lyons D, MD  Omega 3 1000 MG CAPS Take 1 capsule by mouth.     [provider]  rosuvastatin (CRESTOR) 20 MG tablet Take 10 mg by mouth every evening.     [provider]  vitamin B-12 (CYANOCOBALAMIN) 100 MCG tablet Take 100 mcg by mouth daily.    [provider]    Allergies    Fosinopril  Review of Systems   Review of Systems  Constitutional: Negative for chills, diaphoresis and fever.  Respiratory: Negative for cough and shortness of breath.   Cardiovascular: Positive for leg swelling. Negative for chest pain.  Gastrointestinal: Negative for abdominal pain, diarrhea, nausea and vomiting.  Genitourinary: Negative for difficulty urinating and dysuria.   Musculoskeletal: Negative for back pain and neck pain.  Neurological: Negative for dizziness, syncope, weakness, numbness and headaches.  All other systems reviewed and are negative.   Physical Exam Updated Vital Signs BP (!) 182/85 (BP Location: Left Arm)    Pulse 65    Temp 98.8 F (37.1 C) (Oral)    Resp 18    SpO2 100%   Physical Exam Vitals and nursing note reviewed.  Constitutional:      General: He is not in acute distress.    Appearance: He is well-developed. He is not diaphoretic.  HENT:     Head: Normocephalic and atraumatic.     Mouth/Throat:     Mouth: Mucous membranes are moist.  Pharynx: Oropharynx is clear.  Eyes:     Conjunctiva/sclera: Conjunctivae normal.  Cardiovascular:     Rate and Rhythm: Normal rate and regular rhythm.     Pulses: Normal pulses.          Radial pulses are 2+ on the right side and 2+ on the left side.       Posterior tibial pulses are 2+ on the right side and 2+ on the left side.     Heart sounds: Normal heart sounds.     Comments: Tactile temperature in the extremities appropriate and equal bilaterally. Pulmonary:     Effort: Pulmonary effort is normal. No respiratory distress.     Breath sounds: Normal breath sounds.  Abdominal:     Palpations: Abdomen is soft.     Tenderness: There is no abdominal tenderness. There is no guarding.  Musculoskeletal:     Cervical back: Neck supple.     Right lower leg: Edema present.     Left lower leg: Edema present.     Comments: Very mild appearing lower extremity edema in the feet and ankles.   No tenderness, increased warmth, or erythema in the calves or other areas of the lower extremities.  Lymphadenopathy:     Cervical: Cervical adenopathy present.  Skin:    General: Skin is warm and dry.  Neurological:     Mental Status: He is alert and oriented to person, place, and time.     Comments: No noted acute cognitive deficit. Sensation grossly intact to light touch in the extremities.    Grip strengths equal bilaterally.   Strength 5/5 in all extremities.  No gait disturbance.  Coordination intact.  Cranial nerves III-XII grossly intact.  Handles oral secretions without noted difficulty.  No noted phonation or speech deficit. No facial droop.   Psychiatric:        Mood and Affect: Mood and affect normal.        Speech: Speech normal.        Behavior: Behavior normal.     ED Results / Procedures / Treatments   Labs (all labs ordered are listed, but only abnormal results are displayed) Labs Reviewed  URINALYSIS, ROUTINE W REFLEX MICROSCOPIC - Abnormal; Notable for the following components:      Result Value   Color, Urine STRAW (*)    All other components within normal limits  COMPREHENSIVE METABOLIC PANEL - Abnormal; Notable for the following components:   Creatinine, Ser 1.27 (*)    Total Bilirubin 1.3 (*)    GFR, Estimated 53 (*)    All other components within normal limits  CBC WITH DIFFERENTIAL/PLATELET - Abnormal; Notable for the following components:   RBC 4.15 (*)    Hemoglobin 11.8 (*)    HCT 37.5 (*)    Platelets 133 (*)    All other components within normal limits  SARS CORONAVIRUS 2 (TAT 6-24 HRS)   Hemoglobin  Date Value Ref Range Status  09/16/2020 11.8 (L) 13.0 - 17.0 g/dL Final  01/24/2020 12.5 (L) 13.0 - 17.0 g/dL Final  01/14/2020 12.2 (L) 13.0 - 17.0 g/dL Final  12/29/2019 11.6 (L) 13.0 - 17.0 g/dL Final   BUN  Date Value Ref Range Status  09/16/2020 16 8 - 23 mg/dL Final  01/24/2020 19 8 - 23 mg/dL Final  01/14/2020 19 8 - 23 mg/dL Final  12/29/2019 20 8 - 23 mg/dL Final  02/04/2017 19 8 - 27 mg/dL Final   Creatinine, Ser  Date Value  Ref Range Status  09/16/2020 1.27 (H) 0.61 - 1.24 mg/dL Final  01/24/2020 1.46 (H) 0.61 - 1.24 mg/dL Final  01/14/2020 1.58 (H) 0.61 - 1.24 mg/dL Final  12/29/2019 1.88 (H) 0.61 - 1.24 mg/dL Final     EKG None  Radiology No results found.  Procedures Procedures   Medications Ordered  in ED Medications  hydrALAZINE (APRESOLINE) tablet 50 mg (50 mg Oral Given 09/16/20 1659)    ED Course  I have reviewed the triage vital signs and the nursing notes.  Pertinent labs & imaging results that were available during my care of the patient were reviewed by me and considered in my medical decision making (see chart for details).    MDM Rules/Calculators/A&P                          Patient presents with concerns regarding his hypertension and lower extremity edema.   He does not think the lower extremity edema is worse than normal, it is just that it coincided with a higher blood pressure reading. It was noted that patient did not increase his frequency of taking his hydralazine and so he was advised to do so. Patient does have bilateral submandibular cervical lymphadenopathy, but has no intraoral, facial, or throat complaints.  This will need to be a PCP follow-up. Chest x-ray was discussed, but patient declined.  Findings and plan of care discussed with attending physician, Dorie Rank, MD. Dr. Tomi Bamberger personally evaluated and examined this patient.  Final Clinical Impression(s) / ED Diagnoses Final diagnoses:  Bilateral lower extremity edema  Elevated blood pressure reading    Rx / DC Orders ED Discharge Orders    None       Layla Maw 09/16/20 1851    Lorayne Bender, PA-C 09/16/20 Yevette Edwards    Dorie Rank, MD 09/17/20 1306

## 2020-09-16 NOTE — ED Triage Notes (Signed)
Pt presents with c/o bilateral leg swelling. Pt reports the swelling has been present for several years but has recently been getting worse. Pt uses a cane to ambulate.

## 2020-09-17 LAB — SARS CORONAVIRUS 2 (TAT 6-24 HRS): SARS Coronavirus 2: NEGATIVE

## 2020-12-13 ENCOUNTER — Encounter: Payer: Self-pay | Admitting: Podiatry

## 2020-12-13 ENCOUNTER — Ambulatory Visit (INDEPENDENT_AMBULATORY_CARE_PROVIDER_SITE_OTHER): Payer: Medicare Other | Admitting: Podiatry

## 2020-12-13 ENCOUNTER — Other Ambulatory Visit: Payer: Self-pay

## 2020-12-13 DIAGNOSIS — B351 Tinea unguium: Secondary | ICD-10-CM | POA: Diagnosis not present

## 2020-12-13 DIAGNOSIS — Z461 Encounter for fitting and adjustment of hearing aid: Secondary | ICD-10-CM | POA: Insufficient documentation

## 2020-12-13 NOTE — Progress Notes (Signed)
This patient returns to the office for evaluation and treatment of long thick painful nails .  This patient is unable to trim his own nails since the patient cannot reach his feet.  Patient says the nails are painful walking and wearing his shoes.  He returns for preventive foot care services.  General Appearance  Alert, conversant and in no acute stress.  Vascular  Dorsalis pedis and posterior tibial  pulses are weakly  palpable  bilaterally.  Capillary return is within normal limits  bilaterally. Temperature is within normal limits  bilaterally.  Neurologic  Senn-Weinstein monofilament wire test within normal limits  bilaterally. Muscle power within normal limits bilaterally.  Nails Thick disfigured discolored nails with subungual debris  from hallux to fifth toes bilaterally. No evidence of bacterial infection or drainage bilaterally.  Orthopedic  No limitations of motion  feet .  No crepitus or effusions noted.  No bony pathology or digital deformities noted.  HAV  B/L.  Skin  normotropic skin with no porokeratosis noted bilaterally.  No signs of infections or ulcers noted.     Onychomycosis  Pain in toes right foot  Pain in toes left foot  Debridement  of nails  1-5  B/L with a nail nipper.  Nails were then filed using a dremel tool with no incidents.    RTC 10 weeks   Gardiner Barefoot DPM

## 2020-12-27 ENCOUNTER — Emergency Department (HOSPITAL_COMMUNITY)
Admission: EM | Admit: 2020-12-27 | Discharge: 2020-12-27 | Disposition: A | Discharge status: Home / Self Care | Payer: Medicare Other | Attending: Emergency Medicine | Admitting: Emergency Medicine

## 2020-12-27 ENCOUNTER — Other Ambulatory Visit: Payer: Self-pay

## 2020-12-27 DIAGNOSIS — M7989 Other specified soft tissue disorders: Secondary | ICD-10-CM | POA: Insufficient documentation

## 2020-12-27 DIAGNOSIS — Z5321 Procedure and treatment not carried out due to patient leaving prior to being seen by health care provider: Secondary | ICD-10-CM | POA: Insufficient documentation

## 2020-12-27 DIAGNOSIS — R030 Elevated blood-pressure reading, without diagnosis of hypertension: Secondary | ICD-10-CM | POA: Insufficient documentation

## 2020-12-27 NOTE — ED Triage Notes (Signed)
Called X3 no answer

## 2021-01-19 NOTE — Assessment & Plan Note (Signed)
To start using replacement machine tonight auto 10-20/ [employer]

## 2021-01-19 NOTE — Assessment & Plan Note (Signed)
Mild intermittent uncomplicated Well controlled - no concern at this visit

## 2021-02-03 ENCOUNTER — Emergency Department (HOSPITAL_COMMUNITY)
Admission: EM | Admit: 2021-02-03 | Discharge: 2021-02-03 | Disposition: A | Discharge status: Home / Self Care | Payer: Medicare Other | Attending: Emergency Medicine | Admitting: Emergency Medicine

## 2021-02-03 ENCOUNTER — Emergency Department (HOSPITAL_COMMUNITY): Payer: Medicare Other

## 2021-02-03 ENCOUNTER — Other Ambulatory Visit: Payer: Self-pay

## 2021-02-03 DIAGNOSIS — R600 Localized edema: Secondary | ICD-10-CM | POA: Diagnosis not present

## 2021-02-03 DIAGNOSIS — R059 Cough, unspecified: Secondary | ICD-10-CM | POA: Diagnosis not present

## 2021-02-03 DIAGNOSIS — Z87891 Personal history of nicotine dependence: Secondary | ICD-10-CM | POA: Diagnosis not present

## 2021-02-03 DIAGNOSIS — I10 Essential (primary) hypertension: Secondary | ICD-10-CM

## 2021-02-03 DIAGNOSIS — Z20822 Contact with and (suspected) exposure to covid-19: Secondary | ICD-10-CM | POA: Insufficient documentation

## 2021-02-03 DIAGNOSIS — Z86010 Personal history of colonic polyps: Secondary | ICD-10-CM | POA: Diagnosis not present

## 2021-02-03 DIAGNOSIS — J029 Acute pharyngitis, unspecified: Secondary | ICD-10-CM | POA: Insufficient documentation

## 2021-02-03 DIAGNOSIS — J45909 Unspecified asthma, uncomplicated: Secondary | ICD-10-CM | POA: Insufficient documentation

## 2021-02-03 DIAGNOSIS — Z79899 Other long term (current) drug therapy: Secondary | ICD-10-CM | POA: Insufficient documentation

## 2021-02-03 DIAGNOSIS — I129 Hypertensive chronic kidney disease with stage 1 through stage 4 chronic kidney disease, or unspecified chronic kidney disease: Secondary | ICD-10-CM | POA: Insufficient documentation

## 2021-02-03 DIAGNOSIS — M7989 Other specified soft tissue disorders: Secondary | ICD-10-CM | POA: Diagnosis present

## 2021-02-03 DIAGNOSIS — N189 Chronic kidney disease, unspecified: Secondary | ICD-10-CM | POA: Diagnosis not present

## 2021-02-03 DIAGNOSIS — Z96651 Presence of right artificial knee joint: Secondary | ICD-10-CM | POA: Insufficient documentation

## 2021-02-03 DIAGNOSIS — R609 Edema, unspecified: Secondary | ICD-10-CM

## 2021-02-03 LAB — COMPREHENSIVE METABOLIC PANEL
ALT: 18 U/L (ref 0–44)
AST: 15 U/L (ref 15–41)
Albumin: 3.6 g/dL (ref 3.5–5.0)
Alkaline Phosphatase: 48 U/L (ref 38–126)
Anion gap: 7 (ref 5–15)
BUN: 19 mg/dL (ref 8–23)
CO2: 27 mmol/L (ref 22–32)
Calcium: 9 mg/dL (ref 8.9–10.3)
Chloride: 106 mmol/L (ref 98–111)
Creatinine, Ser: 1.4 mg/dL — ABNORMAL HIGH (ref 0.61–1.24)
GFR, Estimated: 47 mL/min — ABNORMAL LOW (ref >60)
Glucose, Bld: 91 mg/dL (ref 70–99)
Potassium: 4.1 mmol/L (ref 3.5–5.1)
Sodium: 140 mmol/L (ref 135–145)
Total Bilirubin: 1.3 mg/dL — ABNORMAL HIGH (ref 0.3–1.2)
Total Protein: 6.8 g/dL (ref 6.5–8.1)

## 2021-02-03 LAB — RESP PANEL BY RT-PCR (FLU A&B, COVID) ARPGX2
Influenza A by PCR: NEGATIVE
Influenza B by PCR: NEGATIVE
SARS Coronavirus 2 by RT PCR: NEGATIVE

## 2021-02-03 LAB — CBC WITH DIFFERENTIAL/PLATELET
Abs Immature Granulocytes: 0.01 10*3/uL (ref 0.00–0.07)
Basophils Absolute: 0 10*3/uL (ref 0.0–0.1)
Basophils Relative: 0 %
Eosinophils Absolute: 0.1 10*3/uL (ref 0.0–0.5)
Eosinophils Relative: 2 %
HCT: 35.6 % — ABNORMAL LOW (ref 39.0–52.0)
Hemoglobin: 11.3 g/dL — ABNORMAL LOW (ref 13.0–17.0)
Immature Granulocytes: 0 %
Lymphocytes Relative: 27 %
Lymphs Abs: 1.3 10*3/uL (ref 0.7–4.0)
MCH: 28.2 pg (ref 26.0–34.0)
MCHC: 31.7 g/dL (ref 30.0–36.0)
MCV: 88.8 fL (ref 80.0–100.0)
Monocytes Absolute: 0.6 10*3/uL (ref 0.1–1.0)
Monocytes Relative: 13 %
Neutro Abs: 2.7 10*3/uL (ref 1.7–7.7)
Neutrophils Relative %: 58 %
Platelets: 142 10*3/uL — ABNORMAL LOW (ref 150–400)
RBC: 4.01 MIL/uL — ABNORMAL LOW (ref 4.22–5.81)
RDW: 14 % (ref 11.5–15.5)
WBC: 4.8 10*3/uL (ref 4.0–10.5)
nRBC: 0 % (ref 0.0–0.2)

## 2021-02-03 LAB — BRAIN NATRIURETIC PEPTIDE: B Natriuretic Peptide: 275.1 pg/mL — ABNORMAL HIGH (ref 0.0–100.0)

## 2021-02-03 LAB — TROPONIN I (HIGH SENSITIVITY)
Troponin I (High Sensitivity): 36 ng/L — ABNORMAL HIGH (ref <18)
Troponin I (High Sensitivity): 33 ng/L — ABNORMAL HIGH (ref <18)

## 2021-02-03 MED ORDER — HYDRALAZINE HCL 25 MG PO TABS
25.0000 mg | ORAL_TABLET | Freq: Once | ORAL | Status: AC
  Administered 2021-02-03: 25 mg via ORAL
  Filled 2021-02-03: qty 1

## 2021-02-03 NOTE — ED Triage Notes (Signed)
Pt c/o hypention, BLLE swelling "going on for a little while." Also c/o throat hoarseness. C/o fatigue.   Denies CP, ShOB.

## 2021-02-03 NOTE — ED Provider Notes (Signed)
[facility] DEPT Provider Note   CSN: [id number] Arrival date & time: 02/03/21  1208     History Chief Complaint  Patient presents with   Hypertension   Sore Throat    Christopher Shepherd is a 87 y.o. male.  HPI Patient reports he has had increasing swelling in his lower legs and ankles for several days.  He reports that it goes down when he elevates his legs and often stays down much of the day.  However when he wakes up in the morning his legs are swollen again.  Reports it is also worse if he has been standing and walking more.  Patient denies he has felt significant increase in shortness of breath.  He has not had any associated chest pain.  He reports very mild amount of coughing with no fever body aches or chills.  No abdominal pain vomiting or diarrhea.  Patient reports his blood pressures are sometimes running higher than normal.  He reports typically blood pressures are controlled around [id number] systolic.  Now he is getting numbers sometimes up to [id number] or [id number] systolic.  He reports he was told by one doctor that he could take his hydralazine 3 times a day but his doctor told him twice a day.  At this time, he is sticking with the twice a day dosing but wonders if he should be taking the 3 times a day dosing.    Past Medical History:  Diagnosis Date   Allergic rhinitis    Anemia    Arthritis    osteoarthritis   Asthma 1994   with bronchitis   Cough    Hyperlipidemia    Hypertension    Obstructive sleep apnea    uses a C-Pap    Patient Active Problem List   Diagnosis Date Noted   Encounter for fitting and adjustment of hearing aid 12/13/2020   Abnormal glucose level 09/06/2020   Artificial knee joint present 09/06/2020   Chronic kidney disease 09/06/2020   Edema 09/06/2020   Obesity 09/06/2020   Polyphagia 09/06/2020   Primary localized osteoarthrosis, lower leg 09/06/2020   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   Chronic asthmatic  bronchitis with acute exacerbation ([place]) 07/16/2014   Pain in lower limb 11/24/2013   S/P total knee arthroplasty 10/23/2013   Hyperlipidemia 10/08/2013   Constipation 10/08/2013   Essential hypertension, benign 10/08/2013   Hiccups 10/08/2013   Acute blood loss anemia 10/06/2013   OA (osteoarthritis) of knee 10/04/2013   Onychomycosis 10/30/2012   Pain in joint, ankle and foot 10/30/2012   Allergic rhinitis due to pollen 09/05/2010   Vitamin D deficiency 03/03/2009   Obstructive sleep apnea 04/16/2007   Allergic-infective asthma 04/16/2007   COUGH 04/16/2007   History of colonic polyps 08/06/2005    Past Surgical History:  Procedure Laterality Date   COLONOSCOPY W/ POLYPECTOMY     none     TOTAL KNEE ARTHROPLASTY Right 10/04/2013   Procedure: RIGHT TOTAL KNEE ARTHROPLASTY;  Surgeon: Gearlean Alf, MD;  Location: [facility];  Service: Orthopedics;  Laterality: Right;       Family History  Problem Relation Age of Onset   Cancer Father        cause of death   Colon cancer Mother        cause of death    Social History   Tobacco Use   Smoking status: Former    Packs/day: 0.20    Years: 15.00    Pack years:  3.00    Types: Cigarettes    Quit date: 07/01/1968    Years since quitting: 52.6   Smokeless tobacco: Former   Tobacco comments:    Quit 50 yrs ago as of 2014  Vaping Use   Vaping Use: Never used  Substance Use Topics   Alcohol use: No    Alcohol/week: 3.0 standard drinks    Types: 3 Cans of beer per week    Comment: occasionally   Drug use: No    Home Medications Prior to Admission medications   Medication Sig Start Date End Date Taking? Authorizing Provider  acetaminophen (TYLENOL) 650 MG CR tablet Take 650 mg by mouth every 8 (eight) hours as needed for pain.    [provider]  amLODipine (NORVASC) 10 MG tablet amlodipine 10 mg tablet  Take 1 tablet every day by oral route.    [provider]  chlorhexidine (PERIDEX) 0.12 % solution  chlorhexidine gluconate 0.12 % mouthwash    [provider]  Cholecalciferol (VITAMIN D-3) 1000 units CAPS Take 1 capsule by mouth daily.     [provider]  Coenzyme Q10 (CO Q 10 PO) Co Q-10 300 mg capsule  Take 1 capsule every day by oral route.    [provider]  furosemide (LASIX) 20 MG tablet Take 1 tablet by mouth daily. 08/23/20   [provider]  hydrALAZINE (APRESOLINE) 25 MG tablet Take 50 mg by mouth 2 (two) times daily. 03/10/20   [provider]  losartan (COZAAR) 100 MG tablet Take 100 mg by mouth every morning.    [provider]  metoprolol succinate (TOPROL-XL) 25 MG 24 hr tablet Take 12.5 mg by mouth daily.     [provider]  mometasone (NASONEX) 50 MCG/ACT nasal spray 1-2 sprays each nostril daily at bedtime while needed Patient taking differently: Place 1-2 sprays into the nose at bedtime as needed (allergies). 08/12/16   Baird Lyons D, MD  Omega 3 1000 MG CAPS Take 1 capsule by mouth.     [provider]  rosuvastatin (CRESTOR) 20 MG tablet Take 10 mg by mouth every evening.     [provider]  vitamin B-12 (CYANOCOBALAMIN) 100 MCG tablet Take 100 mcg by mouth daily.    [provider]    Allergies    Fosinopril  Review of Systems   Review of Systems 10 systems reviewed and negative except as per HPI Physical Exam Updated Vital Signs BP 101/75   Pulse 61   Temp 98.2 F (36.8 C) (Oral)   Resp 18   SpO2 100%   Physical Exam Constitutional:      Appearance: Normal appearance.  HENT:     Mouth/Throat:     Pharynx: Oropharynx is clear.  Eyes:     Extraocular Movements: Extraocular movements intact.  Cardiovascular:     Rate and Rhythm: Normal rate and regular rhythm.  Pulmonary:     Effort: Pulmonary effort is normal.     Breath sounds: Normal breath sounds.  Abdominal:     General: There is no distension.     Palpations: Abdomen is soft.     Tenderness: There  is no abdominal tenderness. There is no guarding.  Musculoskeletal:     Comments: Pitting edema lower legs.  Symmetric.  Nontender.  No erythema.  Feet do not have significant mount edema.  He has some crenulation consistent with previous increased swelling now resolved.  Feet are warm and dry without wounds.  Skin:    General: Skin is warm and dry.  Neurological:     General: No focal deficit present.     Mental Status: He is alert and oriented to person, place, and time.     Motor: No weakness.     Coordination: Coordination normal.  Psychiatric:        Mood and Affect: Mood normal.    ED Results / Procedures / Treatments   Labs (all labs ordered are listed, but only abnormal results are displayed) Labs Reviewed  COMPREHENSIVE METABOLIC PANEL - Abnormal; Notable for the following components:      Result Value   Creatinine, Ser 1.40 (*)    Total Bilirubin 1.3 (*)    GFR, Estimated 47 (*)    All other components within normal limits  CBC WITH DIFFERENTIAL/PLATELET - Abnormal; Notable for the following components:   RBC 4.01 (*)    Hemoglobin 11.3 (*)    HCT 35.6 (*)    Platelets 142 (*)    All other components within normal limits  BRAIN NATRIURETIC PEPTIDE - Abnormal; Notable for the following components:   B Natriuretic Peptide 275.1 (*)    All other components within normal limits  TROPONIN I (HIGH SENSITIVITY) - Abnormal; Notable for the following components:   Troponin I (High Sensitivity) 36 (*)    All other components within normal limits  TROPONIN I (HIGH SENSITIVITY)    EKG EKG Interpretation  Date/Time:  Saturday February 03 2021 15:04:54 EDT Ventricular Rate:  52 PR Interval:  197 QRS Duration: 93 QT Interval:  499 QTC Calculation: 465 R Axis:   20 Text Interpretation: Sinus rhythm ocassional PAC. otherwise no sig change from previous Confirmed by Charlesetta Shanks [phone]) on 02/03/2021 3:53:09 PM  Radiology DG Chest Portable 1 View  Result Date:  02/03/2021 CLINICAL DATA:  Fatigue. EXAM: PORTABLE CHEST 1 VIEW COMPARISON:  05/12/2020 FINDINGS: Both lungs are clear. Heart and mediastinum are within normal limits. Negative for a pneumothorax. Stable calcifications inferior to the right coracoid process. Soft tissue density in the right paratracheal region is chronic. IMPRESSION: No acute cardiopulmonary disease. Electronically Signed   By: Markus Daft M.D.   On: 02/03/2021 12:51    Procedures Procedures   Medications Ordered in ED Medications - No data to display  ED Course  I have reviewed the triage vital signs and the nursing notes.  Pertinent labs & imaging results that were available during my care of the patient were reviewed by me and considered in my medical decision making (see chart for details).    MDM Rules/Calculators/A&P                            Patient presents as outlined.  At this time he has remained stable throughout his stay in the emergency department.  Troponins are stable at mild elevation without interval change.  Chest x-ray does not show significant vascular congestion.  Lungs are clear to auscultation.  At this time I do not suspect significant CHF exacerbation.  Patient does have peripheral edema.  Plan will be for temporary increase in Lasix dosing for several days.  Reports blood pressures elevated and at this time will recommend he goes ahead with his 3 times daily hydralazine dosing.  Return precautions reviewed.  Patient discharged in good condition. Final Clinical Impression(s) / ED Diagnoses Final diagnoses:  Hypertension, unspecified type  Peripheral edema    Rx / DC Orders ED Discharge  Orders     None        Charlesetta Shanks, MD 02/03/21 [phone]

## 2021-02-03 NOTE — Discharge Instructions (Addendum)
1.  You may add an additional '25mg'$  dose of your hydralazine midday.  Continue your morning and evening doses as usual. 2.  You may add an additional midday dose of your Lasix for the next 3 to 4 days.  Continue elevating your legs.  Then resume your normal Lasix dosing. 3.  Have a recheck with your doctor within 5 to 7 days to see if your symptoms are improved and recheck blood pressures. 4.  Return to the emergency department if you develop any worsening shortness of breath, chest pain, general weakness or other concerning symptoms.

## 2021-02-03 NOTE — ED Provider Notes (Signed)
Emergency Medicine Provider Triage Evaluation Note  Christopher Shepherd , a 87 y.o. male  was evaluated in triage.  Pt complains of hypertension and fatigue.  Patient states that hypertension is having that he has been dealing with for a long time.  Patient states that he was recently switched from amlodipine to hydrochlorothiazide.  Patient states that he has been taking his medication as prescribed.  He reports taking medication this morning.  Patient denies any chest pain, shortness of breath, palpitations.  Patient states that he is having swelling to bilateral lower legs as well as generalized fatigue.  Review of Systems  Positive: Hypertension, fatigue, "cloudy vision", leg swelling Negative: Chest pain, shortness of breath, nausea, vomiting, abdominal pain, numbness, weakness, facial asymmetry, slurred speech  Physical Exam  BP (!) 153/77 (BP Location: Left Arm)   Pulse (!) 56   Temp 98.2 F (36.8 C) (Oral)   Resp 16   SpO2 98%  Gen:   Awake, no distress   Resp:  Normal effort, lungs clear to auscultation bilaterally MSK:   Moves extremities without difficulty, trace swelling noted to bilateral lower extremities Other:  +5 strength to bilateral upper and lower extremities.  Medical Decision Making  Medically screening exam initiated at 12:31 PM.  Appropriate orders placed.  Raynor Schneider was informed that the remainder of the evaluation will be completed by another provider, this initial triage assessment does not replace that evaluation, and the importance of remaining in the ED until their evaluation is complete.  The patient appears stable so that the remainder of the work up may be completed by another provider.      Dyann Ruddle 02/03/21 1233    Carmin Muskrat, MD 02/03/21 1620

## 2021-02-09 ENCOUNTER — Telehealth: Payer: Self-pay | Admitting: Internal Medicine

## 2021-02-09 NOTE — Telephone Encounter (Signed)
Called and spoke with pt stating to him if he was having issues with his stomach that he needed to contact PCP and he verbalized understanding. Pt said he also received a call from company about a cpap machine and I stated to him if he has questions about that that he should contact the company who originally called him. Nothing further needed.

## 2021-02-09 NOTE — Telephone Encounter (Signed)
Pt stated that he believes that he has been having some issues following his x-ray scans stated that he has been having issues with his lung does not report and he stated he has not been more SOB but does believe he has some issues with his stomach (denies notifying his PCP). Also, pt has questions in regards to his CPAP.    Pls regard; [phone]

## 2021-03-16 ENCOUNTER — Other Ambulatory Visit: Payer: Self-pay

## 2021-03-16 ENCOUNTER — Emergency Department (HOSPITAL_COMMUNITY)
Admission: EM | Admit: 2021-03-16 | Discharge: 2021-03-16 | Discharge status: Against Medical Advice | Payer: Medicare Other

## 2021-03-19 ENCOUNTER — Ambulatory Visit: Payer: Medicare Other | Admitting: Podiatry

## 2021-03-21 ENCOUNTER — Encounter (HOSPITAL_COMMUNITY): Payer: Self-pay

## 2021-03-21 ENCOUNTER — Other Ambulatory Visit: Payer: Self-pay

## 2021-03-21 ENCOUNTER — Emergency Department (HOSPITAL_COMMUNITY)
Admission: EM | Admit: 2021-03-21 | Discharge: 2021-03-21 | Disposition: A | Discharge status: Home / Self Care | Payer: Medicare Other | Attending: Emergency Medicine | Admitting: Emergency Medicine

## 2021-03-21 ENCOUNTER — Emergency Department (HOSPITAL_BASED_OUTPATIENT_CLINIC_OR_DEPARTMENT_OTHER)
Admit: 2021-03-21 | Discharge: 2021-03-21 | Disposition: A | Discharge status: Home / Self Care | Payer: Medicare Other | Attending: Emergency Medicine | Admitting: Emergency Medicine

## 2021-03-21 DIAGNOSIS — M79605 Pain in left leg: Secondary | ICD-10-CM | POA: Diagnosis present

## 2021-03-21 DIAGNOSIS — Z79899 Other long term (current) drug therapy: Secondary | ICD-10-CM | POA: Diagnosis not present

## 2021-03-21 DIAGNOSIS — Z87891 Personal history of nicotine dependence: Secondary | ICD-10-CM | POA: Insufficient documentation

## 2021-03-21 DIAGNOSIS — N189 Chronic kidney disease, unspecified: Secondary | ICD-10-CM | POA: Diagnosis not present

## 2021-03-21 DIAGNOSIS — I129 Hypertensive chronic kidney disease with stage 1 through stage 4 chronic kidney disease, or unspecified chronic kidney disease: Secondary | ICD-10-CM | POA: Diagnosis not present

## 2021-03-21 DIAGNOSIS — M7989 Other specified soft tissue disorders: Secondary | ICD-10-CM

## 2021-03-21 DIAGNOSIS — M79662 Pain in left lower leg: Secondary | ICD-10-CM | POA: Insufficient documentation

## 2021-03-21 DIAGNOSIS — J45909 Unspecified asthma, uncomplicated: Secondary | ICD-10-CM | POA: Insufficient documentation

## 2021-03-21 NOTE — ED Provider Notes (Signed)
[facility] DEPT Provider Note   CSN: [id number] Arrival date & time: 03/21/21  1146     History Chief Complaint  Patient presents with   Leg Pain    Christopher Shepherd is a 87 y.o. male who presents the emergency department today for further evaluation of left leg pain that has been worsening over the last 3 days.  States it has been intermittent since onset.  Ibuprofen improves the pain.  It radiates up the proximal lateral thigh.  He denies any numbness/weakness to the lower extremities, fever, chills, cough, abdominal pain, nausea, vomiting, chest pain, shortness of breath.  He rates his leg pain mild in severity at the moment.  He recently just finished paxlovid for COVID. The history is provided by the patient. No language interpreter was used.  Leg Pain     Past Medical History:  Diagnosis Date   Allergic rhinitis    Anemia    Arthritis    osteoarthritis   Asthma 1994   with bronchitis   Cough    Hyperlipidemia    Hypertension    Obstructive sleep apnea    uses a C-Pap    Patient Active Problem List   Diagnosis Date Noted   Encounter for fitting and adjustment of hearing aid 12/13/2020   Abnormal glucose level 09/06/2020   Artificial knee joint present 09/06/2020   Chronic kidney disease 09/06/2020   Edema 09/06/2020   Obesity 09/06/2020   Polyphagia 09/06/2020   Primary localized osteoarthrosis, lower leg 09/06/2020   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   Chronic asthmatic bronchitis with acute exacerbation ([place]) 07/16/2014   Pain in lower limb 11/24/2013   S/P total knee arthroplasty 10/23/2013   Hyperlipidemia 10/08/2013   Constipation 10/08/2013   Essential hypertension, benign 10/08/2013   Hiccups 10/08/2013   Acute blood loss anemia 10/06/2013   OA (osteoarthritis) of knee 10/04/2013   Onychomycosis 10/30/2012   Pain in joint, ankle and foot 10/30/2012   Allergic rhinitis due to pollen 09/05/2010   Vitamin  D deficiency 03/03/2009   Obstructive sleep apnea 04/16/2007   Allergic-infective asthma 04/16/2007   COUGH 04/16/2007   History of colonic polyps 08/06/2005    Past Surgical History:  Procedure Laterality Date   COLONOSCOPY W/ POLYPECTOMY     none     TOTAL KNEE ARTHROPLASTY Right 10/04/2013   Procedure: RIGHT TOTAL KNEE ARTHROPLASTY;  Surgeon: Gearlean Alf, MD;  Location: [facility];  Service: Orthopedics;  Laterality: Right;       Family History  Problem Relation Age of Onset   Cancer Father        cause of death   Colon cancer Mother        cause of death    Social History   Tobacco Use   Smoking status: Former    Packs/day: 0.20    Years: 15.00    Pack years: 3.00    Types: Cigarettes    Quit date: 07/01/1968    Years since quitting: 52.7   Smokeless tobacco: Former   Tobacco comments:    Quit 50 yrs ago as of 2014  Vaping Use   Vaping Use: Never used  Substance Use Topics   Alcohol use: No    Alcohol/week: 3.0 standard drinks    Types: 3 Cans of beer per week    Comment: occasionally   Drug use: No    Home Medications Prior to Admission medications   Medication Sig Start Date End Date Taking?  Authorizing Provider  acetaminophen (TYLENOL) 650 MG CR tablet Take 650 mg by mouth every 8 (eight) hours as needed for pain.    [provider]  amLODipine (NORVASC) 10 MG tablet amlodipine 10 mg tablet  Take 1 tablet every day by oral route.    [provider]  chlorhexidine (PERIDEX) 0.12 % solution chlorhexidine gluconate 0.12 % mouthwash    [provider]  Cholecalciferol (VITAMIN D-3) 1000 units CAPS Take 1 capsule by mouth daily.     [provider]  Coenzyme Q10 (CO Q 10 PO) Co Q-10 300 mg capsule  Take 1 capsule every day by oral route.    [provider]  furosemide (LASIX) 20 MG tablet Take 1 tablet by mouth daily. 08/23/20   [provider]  hydrALAZINE (APRESOLINE) 25 MG tablet Take 50 mg by mouth 2  (two) times daily. 03/10/20   [provider]  losartan (COZAAR) 100 MG tablet Take 100 mg by mouth every morning.    [provider]  metoprolol succinate (TOPROL-XL) 25 MG 24 hr tablet Take 12.5 mg by mouth daily.     [provider]  mometasone (NASONEX) 50 MCG/ACT nasal spray 1-2 sprays each nostril daily at bedtime while needed Patient taking differently: Place 1-2 sprays into the nose at bedtime as needed (allergies). 08/12/16   Baird Lyons D, MD  Omega 3 1000 MG CAPS Take 1 capsule by mouth.     [provider]  rosuvastatin (CRESTOR) 20 MG tablet Take 10 mg by mouth every evening.     [provider]  vitamin B-12 (CYANOCOBALAMIN) 100 MCG tablet Take 100 mcg by mouth daily.    [provider]    Allergies    Fosinopril  Review of Systems   Review of Systems  All other systems reviewed and are negative.  Physical Exam Updated Vital Signs BP (!) 186/75   Pulse (!) 55   Temp 99 F (37.2 C) (Oral)   Resp 18   Ht '5\' 6"'$  (1.676 m)   Wt 82.9 kg   SpO2 100%   BMI 29.50 kg/m   Physical Exam Constitutional:      General: He is not in acute distress.    Appearance: Normal appearance.  HENT:     Head: Normocephalic and atraumatic.  Eyes:     General:        Right eye: No discharge.        Left eye: No discharge.  Cardiovascular:     Comments: Regular rate and rhythm.  S1/S2 are distinct without any evidence of murmur, rubs, or gallops.  Radial pulses are 2+ bilaterally.  Dorsalis pedis pulses are 2+ bilaterally.  No evidence of pedal edema. Pulmonary:     Comments: Clear to auscultation bilaterally.  Normal effort.  No respiratory distress.  No evidence of wheezes, rales, or rhonchi heard throughout. Abdominal:     General: Abdomen is flat. Bowel sounds are normal. There is no distension.     Tenderness: There is no abdominal tenderness. There is no guarding or rebound.  Musculoskeletal:     Cervical back: Neck supple.      Comments: Left leg is nontender to palpation.  He has full range of motion at the knee hip and ankle.  Normal sensation  Skin:    General: Skin is warm and dry.     Findings: No rash.  Neurological:     General: No focal deficit present.     Mental  Status: He is alert.  Psychiatric:        Mood and Affect: Mood normal.        Behavior: Behavior normal.    ED Results / Procedures / Treatments   Labs (all labs ordered are listed, but only abnormal results are displayed) Labs Reviewed - No data to display  EKG None  Radiology VAS [place] LOWER EXTREMITY VENOUS (DVT) (ONLY [facility] & [facility])  Result Date: 03/21/2021  Lower Venous DVT Study Patient Name:  Christopher Shepherd  Date of Exam:   03/21/2021 Medical Rec #: [id number]         Accession #:    [id number] Date of Birth: [birth year]          Patient Gender: M Patient Age:   59 years Exam Location:  [facility] Procedure:      VAS [place] LOWER EXTREMITY VENOUS (DVT) Referring Phys: Rayna Sexton --------------------------------------------------------------------------------  Indications: Pain, and Swelling.  Risk Factors: COVID 19 positive. Comparison Study: No prior studies. Performing Technologist: Oliver Hum RVT  Examination Guidelines: A complete evaluation includes B-mode imaging, spectral Doppler, color Doppler, and power Doppler as needed of all accessible portions of each vessel. Bilateral testing is considered an integral part of a complete examination. Limited examinations for reoccurring indications may be performed as noted. The reflux portion of the exam is performed with the patient in reverse Trendelenburg.  +-----+---------------+---------+-----------+----------+--------------+ RIGHTCompressibilityPhasicitySpontaneityPropertiesThrombus Aging +-----+---------------+---------+-----------+----------+--------------+ CFV  Full           Yes      Yes                                  +-----+---------------+---------+-----------+----------+--------------+   +---------+---------------+---------+-----------+----------+--------------+ LEFT     CompressibilityPhasicitySpontaneityPropertiesThrombus Aging +---------+---------------+---------+-----------+----------+--------------+ CFV      Full           Yes      Yes                                 +---------+---------------+---------+-----------+----------+--------------+ SFJ      Full                                                        +---------+---------------+---------+-----------+----------+--------------+ FV Prox  Full                                                        +---------+---------------+---------+-----------+----------+--------------+ FV Mid   Full                                                        +---------+---------------+---------+-----------+----------+--------------+ FV DistalFull                                                        +---------+---------------+---------+-----------+----------+--------------+  PFV      Full                                                        +---------+---------------+---------+-----------+----------+--------------+ POP      Full           Yes      Yes                                 +---------+---------------+---------+-----------+----------+--------------+ PTV      Full                                                        +---------+---------------+---------+-----------+----------+--------------+ PERO     Full                                                        +---------+---------------+---------+-----------+----------+--------------+     Summary: RIGHT: - No evidence of common femoral vein obstruction.  LEFT: - There is no evidence of deep vein thrombosis in the lower extremity.  - No cystic structure found in the popliteal fossa.  *See table(s) above for measurements and observations. Electronically signed  by Harold Barban MD on 03/21/2021 at 7:27:28 PM.    Final     Procedures Procedures   Medications Ordered in ED Medications - No data to display  ED Course  I have reviewed the triage vital signs and the nursing notes.  Pertinent labs & imaging results that were available during my care of the patient were reviewed by me and considered in my medical decision making (see chart for details).    MDM Rules/Calculators/A&P                          Christopher Shepherd is a 87 y.o. male who presents the emergency department today for further evaluation of left leg pain.  He is neurovascularly intact distally.  I have a low suspicion for ischemic leg, compartment syndrome, DVT.  Ultrasound of the extremity was negative for DVT.  This is likely a muscle strain.  Will have him continue ibuprofen for pain.  We will have him follow-up with his primary care provider within the next week for further evaluation.  Final Clinical Impression(s) / ED Diagnoses Final diagnoses:  Pain of left lower extremity    Rx / DC Orders ED Discharge Orders     None        Cherrie Gauze 03/21/21 [place], [place], MD 03/21/21 [phone]

## 2021-03-21 NOTE — ED Triage Notes (Signed)
Patient reports left lower back/hip pain radiating to leg. Covid +

## 2021-03-21 NOTE — ED Notes (Signed)
Patient care taken and patient is requesting something to eat

## 2021-03-21 NOTE — Discharge Instructions (Addendum)
You were seen and evaluated in the Emergency Department today for further evaluation of left leg pain.  As we discussed, there did not appear to be a clot in your leg.  At this point is unclear exactly what is causing your leg pain.  Please return to the emergency department if you are experiencing worsening and severe leg pain, weakness/numbness to the left leg, or any other concerns you might have.  Please follow-up with your primary care provider within the next week for further evaluation of your leg pain.

## 2021-03-21 NOTE — ED Provider Notes (Signed)
Emergency Medicine Provider Triage Evaluation Note  Christopher Shepherd , a 87 y.o. male  was evaluated in triage.  Pt complains of left leg pain.  Denies any falls or injuries.  Reports pain to the left lateral thigh.  Also reports intermittent pedal edema that improves with elevation of the legs at night.  No chest pain or shortness of breath.  Patient was recently diagnosed with COVID-19 and states today is his last day of quarantine.  Physical Exam  BP 131/61   Pulse 63   Temp 98.8 F (37.1 C) (Oral)   Resp 16   Ht '5\' 6"'$  (1.676 m)   Wt 82.9 kg   SpO2 98%   BMI 29.50 kg/m  Gen:   Awake, no distress   Resp:  Normal effort  MSK:   Moves extremities without difficulty Other:    Medical Decision Making  Medically screening exam initiated at 12:23 PM.  Appropriate orders placed.  Christopher Shepherd was informed that the remainder of the evaluation will be completed by another provider, this initial triage assessment does not replace that evaluation, and the importance of remaining in the ED until their evaluation is complete.   Christopher Sexton, PA-C [id number]    Christopher, [place], [place] [id number] [phone]

## 2021-03-28 ENCOUNTER — Emergency Department (HOSPITAL_COMMUNITY)
Admission: EM | Admit: 2021-03-28 | Discharge: 2021-03-28 | Disposition: A | Discharge status: Home / Self Care | Payer: No Typology Code available for payment source | Attending: Emergency Medicine | Admitting: Emergency Medicine

## 2021-03-28 ENCOUNTER — Encounter (HOSPITAL_COMMUNITY): Payer: Self-pay

## 2021-03-28 ENCOUNTER — Emergency Department (HOSPITAL_BASED_OUTPATIENT_CLINIC_OR_DEPARTMENT_OTHER): Payer: No Typology Code available for payment source

## 2021-03-28 DIAGNOSIS — M79605 Pain in left leg: Secondary | ICD-10-CM

## 2021-03-28 DIAGNOSIS — Z79899 Other long term (current) drug therapy: Secondary | ICD-10-CM | POA: Insufficient documentation

## 2021-03-28 DIAGNOSIS — M79652 Pain in left thigh: Secondary | ICD-10-CM

## 2021-03-28 DIAGNOSIS — Z87891 Personal history of nicotine dependence: Secondary | ICD-10-CM | POA: Diagnosis not present

## 2021-03-28 DIAGNOSIS — Z8616 Personal history of COVID-19: Secondary | ICD-10-CM | POA: Diagnosis not present

## 2021-03-28 DIAGNOSIS — N189 Chronic kidney disease, unspecified: Secondary | ICD-10-CM | POA: Insufficient documentation

## 2021-03-28 DIAGNOSIS — J45909 Unspecified asthma, uncomplicated: Secondary | ICD-10-CM | POA: Insufficient documentation

## 2021-03-28 DIAGNOSIS — Z96651 Presence of right artificial knee joint: Secondary | ICD-10-CM | POA: Diagnosis not present

## 2021-03-28 DIAGNOSIS — Z7951 Long term (current) use of inhaled steroids: Secondary | ICD-10-CM | POA: Diagnosis not present

## 2021-03-28 DIAGNOSIS — I129 Hypertensive chronic kidney disease with stage 1 through stage 4 chronic kidney disease, or unspecified chronic kidney disease: Secondary | ICD-10-CM | POA: Insufficient documentation

## 2021-03-28 NOTE — ED Triage Notes (Signed)
Pt presents with c/o left leg pain. Pt recently seen for same.

## 2021-03-28 NOTE — ED Provider Notes (Signed)
Emergency Medicine Provider Triage Evaluation Note  Christopher Shepherd , a 87 y.o. male  was evaluated in triage.  Pt complains of left leg pain. Worse when he walks and in the morning  Review of Systems  Positive: Left leg pain Negative: fever  Physical Exam  BP 131/62 (BP Location: Right Arm)   Pulse 70   Temp 98.8 F (37.1 C) (Oral)   Resp 18   SpO2 100%  Gen:   Awake, no distress   Resp:  Normal effort  MSK:   Moves extremities without difficulty  Other:  Dp pulse intact on the lle  Medical Decision Making  Medically screening exam initiated at 3:53 PM.  Appropriate orders placed.  Christopher Shepherd was informed that the remainder of the evaluation will be completed by another provider, this initial triage assessment does not replace that evaluation, and the importance of remaining in the ED until their evaluation is complete.     Bishop Dublin 03/28/21 1556    Luna Fuse, MD 04/04/21 [phone]

## 2021-03-28 NOTE — ED Notes (Signed)
EDP at the bedside to evaluate.  

## 2021-03-28 NOTE — Progress Notes (Signed)
LLE venous duplex has been completed.  Preliminary results given to [employer], [place].   Results can be found under chart review under CV PROC. 03/28/2021 4:33 PM Keeshia Sanderlin RVT, RDMS

## 2021-03-28 NOTE — Discharge Instructions (Addendum)
Continue what you are doing to help with the pain on the leg.  Follow-up with your doctors as needed.

## 2021-03-28 NOTE — ED Notes (Signed)
No ft   Blanchie Dessert, MD 03/28/21 [phone]

## 2021-03-28 NOTE — ED Provider Notes (Signed)
[facility] DEPT Provider Note   CSN: [id number] Arrival date & time: 03/28/21  1527     History Chief Complaint  Patient presents with   Leg Pain    Jordell Gibbins is a 87 y.o. male.   Leg Pain Associated symptoms: no back pain and no fever   Patient with presents with left leg pain has had for a while now.  Particularly since he has had COVID.  Seen a couple days ago in the ER for similar symptoms.  Negative ultrasound that time.  States it comes and goes.  It is dull.  Not tender to palpation.  No rash.  No fevers.  Does not come up in the back.  No swelling in the leg.    Past Medical History:  Diagnosis Date   Allergic rhinitis    Anemia    Arthritis    osteoarthritis   Asthma 1994   with bronchitis   Cough    Hyperlipidemia    Hypertension    Obstructive sleep apnea    uses a C-Pap    Patient Active Problem List   Diagnosis Date Noted   Encounter for fitting and adjustment of hearing aid 12/13/2020   Abnormal glucose level 09/06/2020   Artificial knee joint present 09/06/2020   Chronic kidney disease 09/06/2020   Edema 09/06/2020   Obesity 09/06/2020   Polyphagia 09/06/2020   Primary localized osteoarthrosis, lower leg 09/06/2020   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   Chronic asthmatic bronchitis with acute exacerbation ([place]) 07/16/2014   Pain in lower limb 11/24/2013   S/P total knee arthroplasty 10/23/2013   Hyperlipidemia 10/08/2013   Constipation 10/08/2013   Essential hypertension, benign 10/08/2013   Hiccups 10/08/2013   Acute blood loss anemia 10/06/2013   OA (osteoarthritis) of knee 10/04/2013   Onychomycosis 10/30/2012   Pain in joint, ankle and foot 10/30/2012   Allergic rhinitis due to pollen 09/05/2010   Vitamin D deficiency 03/03/2009   Obstructive sleep apnea 04/16/2007   Allergic-infective asthma 04/16/2007   COUGH 04/16/2007   History of colonic polyps 08/06/2005    Past Surgical  History:  Procedure Laterality Date   COLONOSCOPY W/ POLYPECTOMY     none     TOTAL KNEE ARTHROPLASTY Right 10/04/2013   Procedure: RIGHT TOTAL KNEE ARTHROPLASTY;  Surgeon: Gearlean Alf, MD;  Location: [facility];  Service: Orthopedics;  Laterality: Right;       Family History  Problem Relation Age of Onset   Cancer Father        cause of death   Colon cancer Mother        cause of death    Social History   Tobacco Use   Smoking status: Former    Packs/day: 0.20    Years: 15.00    Pack years: 3.00    Types: Cigarettes    Quit date: 07/01/1968    Years since quitting: 52.7   Smokeless tobacco: Former   Tobacco comments:    Quit 50 yrs ago as of 2014  Vaping Use   Vaping Use: Never used  Substance Use Topics   Alcohol use: No    Alcohol/week: 3.0 standard drinks    Types: 3 Cans of beer per week    Comment: occasionally   Drug use: No    Home Medications Prior to Admission medications   Medication Sig Start Date End Date Taking? Authorizing Provider  acetaminophen (TYLENOL) 650 MG CR tablet Take 650 mg by mouth  every 8 (eight) hours as needed for pain.    [provider]  amLODipine (NORVASC) 10 MG tablet amlodipine 10 mg tablet  Take 1 tablet every day by oral route.    [provider]  chlorhexidine (PERIDEX) 0.12 % solution chlorhexidine gluconate 0.12 % mouthwash    [provider]  Cholecalciferol (VITAMIN D-3) 1000 units CAPS Take 1 capsule by mouth daily.     [provider]  Coenzyme Q10 (CO Q 10 PO) Co Q-10 300 mg capsule  Take 1 capsule every day by oral route.    [provider]  furosemide (LASIX) 20 MG tablet Take 1 tablet by mouth daily. 08/23/20   [provider]  hydrALAZINE (APRESOLINE) 25 MG tablet Take 50 mg by mouth 2 (two) times daily. 03/10/20   [provider]  losartan (COZAAR) 100 MG tablet Take 100 mg by mouth every morning.    [provider]  metoprolol succinate  (TOPROL-XL) 25 MG 24 hr tablet Take 12.5 mg by mouth daily.     [provider]  mometasone (NASONEX) 50 MCG/ACT nasal spray 1-2 sprays each nostril daily at bedtime while needed Patient taking differently: Place 1-2 sprays into the nose at bedtime as needed (allergies). 08/12/16   Baird Lyons D, MD  Omega 3 1000 MG CAPS Take 1 capsule by mouth.     [provider]  rosuvastatin (CRESTOR) 20 MG tablet Take 10 mg by mouth every evening.     [provider]  vitamin B-12 (CYANOCOBALAMIN) 100 MCG tablet Take 100 mcg by mouth daily.    [provider]    Allergies    Fosinopril  Review of Systems   Review of Systems  Constitutional:  Negative for appetite change and fever.  Respiratory:  Negative for shortness of breath.   Cardiovascular:  Negative for chest pain and leg swelling.  Gastrointestinal:  Negative for abdominal pain.  Genitourinary:  Negative for flank pain.  Musculoskeletal:  Negative for back pain.       Left lower extremity pain.  Skin:  Negative for rash.  Neurological:  Negative for weakness.  Psychiatric/Behavioral:  Negative for confusion.    Physical Exam Updated Vital Signs BP 131/62 (BP Location: Right Arm)   Pulse 70   Temp 98.8 F (37.1 C) (Oral)   Resp 18   SpO2 100%   Physical Exam Vitals and nursing note reviewed.  Constitutional:      Appearance: Normal appearance.  HENT:     Head: Atraumatic.  Eyes:     Pupils: Pupils are equal, round, and reactive to light.  Cardiovascular:     Rate and Rhythm: Regular rhythm.  Pulmonary:     Breath sounds: No wheezing.  Abdominal:     Tenderness: There is no abdominal tenderness.  Musculoskeletal:        General: No tenderness.     Cervical back: Neck supple.     Comments: No tenderness to left lower extremity.  Good range of motion.  No edema.  Warm.  Pulse intact.  No induration.  Skin:    General: Skin is warm.     Capillary Refill: Capillary refill takes less  than 2 seconds.     Coloration: Skin is not jaundiced.  Neurological:     Mental Status: He is alert and oriented to person, place, and time.    ED Results / Procedures / Treatments   Labs (all labs ordered are listed, but only abnormal results are  displayed) Labs Reviewed - No data to display  EKG None  Radiology VAS [place] LOWER EXTREMITY VENOUS (DVT) (7a-7p)  Result Date: 03/28/2021  Lower Venous DVT Study Patient Name:  VIGGO LOONEY  Date of Exam:   03/28/2021 Medical Rec #: [id number]         Accession #:    [id number] Date of Birth: 03/31/1927          Patient Gender: M Patient Age:   85 years Exam Location:  [facility] Procedure:      VAS [place] LOWER EXTREMITY VENOUS (DVT) Referring Phys: Marijean Bravo COUTURE --------------------------------------------------------------------------------  Indications: Lateral left thigh pain.  Comparison Study: Previous exam 03/21/2021 was negative for DVT. Performing Technologist: Rogelia Rohrer RVT, RDMS  Examination Guidelines: A complete evaluation includes B-mode imaging, spectral Doppler, color Doppler, and power Doppler as needed of all accessible portions of each vessel. Bilateral testing is considered an integral part of a complete examination. Limited examinations for reoccurring indications may be performed as noted. The reflux portion of the exam is performed with the patient in reverse Trendelenburg.  +-----+---------------+---------+-----------+----------+--------------+ RIGHTCompressibilityPhasicitySpontaneityPropertiesThrombus Aging +-----+---------------+---------+-----------+----------+--------------+ CFV  Full           Yes      Yes                                 +-----+---------------+---------+-----------+----------+--------------+   +---------+---------------+---------+-----------+----------+--------------+ LEFT     CompressibilityPhasicitySpontaneityPropertiesThrombus Aging  +---------+---------------+---------+-----------+----------+--------------+ CFV      Full           Yes      Yes                                 +---------+---------------+---------+-----------+----------+--------------+ SFJ      Full                                                        +---------+---------------+---------+-----------+----------+--------------+ FV Prox  Full           Yes      Yes                                 +---------+---------------+---------+-----------+----------+--------------+ FV Mid   Full           Yes      Yes                                 +---------+---------------+---------+-----------+----------+--------------+ FV DistalFull           Yes      Yes                                 +---------+---------------+---------+-----------+----------+--------------+ PFV      Full                                                        +---------+---------------+---------+-----------+----------+--------------+  POP      Full           Yes      Yes                                 +---------+---------------+---------+-----------+----------+--------------+ PTV      Full                                                        +---------+---------------+---------+-----------+----------+--------------+ PERO     Full                                                        +---------+---------------+---------+-----------+----------+--------------+     Summary: RIGHT: - No evidence of common femoral vein obstruction.  LEFT: - There is no evidence of deep vein thrombosis in the lower extremity. - There is no evidence of superficial venous thrombosis.  - No cystic structure found in the popliteal fossa.  *See table(s) above for measurements and observations. Electronically signed by Jamelle Haring on 03/28/2021 at 5:19:02 PM.    Final     Procedures Procedures   Medications Ordered in ED Medications - No data to display  ED Course  I have  reviewed the triage vital signs and the nursing notes.  Pertinent labs & imaging results that were available during my care of the patient were reviewed by me and considered in my medical decision making (see chart for details).    MDM Rules/Calculators/A&P                           Patient presents with left leg pain.  Has had for a while now.  Recently seen for same and had negative Doppler.  Repeated Doppler again and negative.  No edema.  Good pulse.  Doubt compartment syndrome.  Doubt arterial ischemia.  No mass felt.  Follow-up as an outpatient.  Discharge home doubt shingles. Final Clinical Impression(s) / ED Diagnoses Final diagnoses:  Left leg pain    Rx / DC Orders ED Discharge Orders     None        Davonna Belling, MD 03/28/21 2330

## 2021-04-02 ENCOUNTER — Other Ambulatory Visit: Payer: Self-pay

## 2021-04-02 ENCOUNTER — Ambulatory Visit (INDEPENDENT_AMBULATORY_CARE_PROVIDER_SITE_OTHER): Payer: Medicare Other | Admitting: Podiatry

## 2021-04-02 ENCOUNTER — Encounter: Payer: Self-pay | Admitting: Podiatry

## 2021-04-02 DIAGNOSIS — M79676 Pain in unspecified toe(s): Secondary | ICD-10-CM

## 2021-04-02 DIAGNOSIS — B351 Tinea unguium: Secondary | ICD-10-CM | POA: Diagnosis not present

## 2021-04-02 NOTE — Progress Notes (Signed)
This patient returns to the office for evaluation and treatment of long thick painful nails .  This patient is unable to trim his own nails since the patient cannot reach his feet.  Patient says the nails are painful walking and wearing his shoes.  He returns for preventive foot care services.  General Appearance  Alert, conversant and in no acute stress.  Vascular  Dorsalis pedis and posterior tibial  pulses are weakly  palpable  bilaterally.  Capillary return is within normal limits  bilaterally. Temperature is within normal limits  bilaterally.  Neurologic  Senn-Weinstein monofilament wire test within normal limits  bilaterally. Muscle power within normal limits bilaterally.  Nails Thick disfigured discolored nails with subungual debris  from hallux to fifth toes bilaterally. No evidence of bacterial infection or drainage bilaterally.  Orthopedic  No limitations of motion  feet .  No crepitus or effusions noted.  No bony pathology or digital deformities noted.  HAV  B/L.  Skin  normotropic skin with no porokeratosis noted bilaterally.  No signs of infections or ulcers noted.     Onychomycosis  Pain in toes right foot  Pain in toes left foot  Debridement  of nails  1-5  B/L with a nail nipper.  Nails were then filed using a dremel tool with no incidents.    RTC 12 weeks   Gardiner Barefoot DPM

## 2021-05-26 ENCOUNTER — Emergency Department (HOSPITAL_COMMUNITY)
Admission: EM | Admit: 2021-05-26 | Discharge: 2021-05-26 | Disposition: A | Discharge status: Home / Self Care | Payer: No Typology Code available for payment source | Attending: Emergency Medicine | Admitting: Emergency Medicine

## 2021-05-26 ENCOUNTER — Emergency Department (HOSPITAL_BASED_OUTPATIENT_CLINIC_OR_DEPARTMENT_OTHER): Payer: No Typology Code available for payment source

## 2021-05-26 ENCOUNTER — Other Ambulatory Visit: Payer: Self-pay

## 2021-05-26 DIAGNOSIS — Z87891 Personal history of nicotine dependence: Secondary | ICD-10-CM | POA: Insufficient documentation

## 2021-05-26 DIAGNOSIS — N189 Chronic kidney disease, unspecified: Secondary | ICD-10-CM | POA: Insufficient documentation

## 2021-05-26 DIAGNOSIS — R531 Weakness: Secondary | ICD-10-CM | POA: Diagnosis not present

## 2021-05-26 DIAGNOSIS — I129 Hypertensive chronic kidney disease with stage 1 through stage 4 chronic kidney disease, or unspecified chronic kidney disease: Secondary | ICD-10-CM | POA: Diagnosis not present

## 2021-05-26 DIAGNOSIS — J45909 Unspecified asthma, uncomplicated: Secondary | ICD-10-CM | POA: Insufficient documentation

## 2021-05-26 DIAGNOSIS — R5383 Other fatigue: Secondary | ICD-10-CM | POA: Diagnosis present

## 2021-05-26 DIAGNOSIS — Z79899 Other long term (current) drug therapy: Secondary | ICD-10-CM | POA: Diagnosis not present

## 2021-05-26 DIAGNOSIS — Z96651 Presence of right artificial knee joint: Secondary | ICD-10-CM | POA: Insufficient documentation

## 2021-05-26 DIAGNOSIS — M79605 Pain in left leg: Secondary | ICD-10-CM | POA: Insufficient documentation

## 2021-05-26 LAB — CBC WITH DIFFERENTIAL/PLATELET
Abs Immature Granulocytes: 0.02 10*3/uL (ref 0.00–0.07)
Basophils Absolute: 0 10*3/uL (ref 0.0–0.1)
Basophils Relative: 0 %
Eosinophils Absolute: 0.1 10*3/uL (ref 0.0–0.5)
Eosinophils Relative: 1 %
HCT: 37.9 % — ABNORMAL LOW (ref 39.0–52.0)
Hemoglobin: 12 g/dL — ABNORMAL LOW (ref 13.0–17.0)
Immature Granulocytes: 0 %
Lymphocytes Relative: 26 %
Lymphs Abs: 1.5 10*3/uL (ref 0.7–4.0)
MCH: 29.1 pg (ref 26.0–34.0)
MCHC: 31.7 g/dL (ref 30.0–36.0)
MCV: 92 fL (ref 80.0–100.0)
Monocytes Absolute: 0.5 10*3/uL (ref 0.1–1.0)
Monocytes Relative: 9 %
Neutro Abs: 3.7 10*3/uL (ref 1.7–7.7)
Neutrophils Relative %: 64 %
Platelets: 169 10*3/uL (ref 150–400)
RBC: 4.12 MIL/uL — ABNORMAL LOW (ref 4.22–5.81)
RDW: 13.8 % (ref 11.5–15.5)
WBC: 5.8 10*3/uL (ref 4.0–10.5)
nRBC: 0 % (ref 0.0–0.2)

## 2021-05-26 LAB — COMPREHENSIVE METABOLIC PANEL
ALT: 31 U/L (ref 0–44)
AST: 28 U/L (ref 15–41)
Albumin: 3.9 g/dL (ref 3.5–5.0)
Alkaline Phosphatase: 53 U/L (ref 38–126)
Anion gap: 10 (ref 5–15)
BUN: 21 mg/dL (ref 8–23)
CO2: 28 mmol/L (ref 22–32)
Calcium: 8.6 mg/dL — ABNORMAL LOW (ref 8.9–10.3)
Chloride: 102 mmol/L (ref 98–111)
Creatinine, Ser: 1.53 mg/dL — ABNORMAL HIGH (ref 0.61–1.24)
GFR, Estimated: 42 mL/min — ABNORMAL LOW (ref >60)
Glucose, Bld: 91 mg/dL (ref 70–99)
Potassium: 3.6 mmol/L (ref 3.5–5.1)
Sodium: 140 mmol/L (ref 135–145)
Total Bilirubin: 1.1 mg/dL (ref 0.3–1.2)
Total Protein: 7.2 g/dL (ref 6.5–8.1)

## 2021-05-26 LAB — URINALYSIS, ROUTINE W REFLEX MICROSCOPIC
Bilirubin Urine: NEGATIVE
Glucose, UA: NEGATIVE mg/dL
Hgb urine dipstick: NEGATIVE
Ketones, ur: NEGATIVE mg/dL
Leukocytes,Ua: NEGATIVE
Nitrite: NEGATIVE
Protein, ur: NEGATIVE mg/dL
Specific Gravity, Urine: 1.013 (ref 1.005–1.030)
pH: 6 (ref 5.0–8.0)

## 2021-05-26 LAB — TROPONIN I (HIGH SENSITIVITY): Troponin I (High Sensitivity): 24 ng/L — ABNORMAL HIGH (ref <18)

## 2021-05-26 NOTE — Discharge Instructions (Addendum)
You were seen in the ER today for your weakness.  Your physical exam, blood work, and EKG were reassuring.  Your urine test is still did not show any sign of infection.  Please follow-up with your cardiologist and your primary care doctor as discussed for next week.  Return to the ER with any new severe symptoms.

## 2021-05-26 NOTE — ED Triage Notes (Signed)
Patient reports he had covid on labor, developed leg pain, was evaluated with no findings, legs are still bothering him. Patient states his appetite is good and he has no shobr. Pain rated 5/10

## 2021-05-26 NOTE — Progress Notes (Signed)
Lower extremity venous LT study completed.  Preliminary results relayed to [place], [place] via secure chat.  See CV Proc for preliminary results report.   Darlin Coco, RDMS, RVT

## 2021-05-26 NOTE — ED Provider Notes (Signed)
[facility] DEPT Provider Note   CSN: [id number] Arrival date & time: 05/26/21  1024     History Chief Complaint  Patient presents with   Leg Pain    Christopher Shepherd is a 87 y.o. male who presents with concern for general fatigue, left leg pain and swelling for the past couple of weeks.  States that he has had intermittent fatigue since he had COVID back in September.  States that his eating and drinking normally denies any chest pain, shortness of breath and palpitations.  States he is here primarily for "preventative maintenance".  I have personally reviewed this patient's medical records.  His history of OSA, chronic bronchitis, hypertension.  He is not anticoagulated.   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   Chronic asthmatic bronchitis with acute exacerbation ([place]) 07/16/2014   Pain in lower limb 11/24/2013   S/P total knee arthroplasty 10/23/2013   Hyperlipidemia 10/08/2013   Constipation 10/08/2013   Essential hypertension, benign 10/08/2013   Hiccups 10/08/2013   Acute blood loss anemia 10/06/2013   OA (osteoarthritis) of knee 10/04/2013   Onychomycosis 10/30/2012   Pain in joint, ankle and foot 10/30/2012   Allergic rhinitis due to pollen 09/05/2010   Vitamin D deficiency 03/03/2009   Obstructive sleep apnea 04/16/2007    Allergic-infective asthma 04/16/2007   COUGH 04/16/2007   History of colonic polyps 08/06/2005    Past Surgical History:  Procedure Laterality Date   COLONOSCOPY W/ POLYPECTOMY     none     TOTAL KNEE ARTHROPLASTY Right 10/04/2013   Procedure: RIGHT TOTAL KNEE ARTHROPLASTY;  Surgeon: Gearlean Alf, MD;  Location: [facility];  Service: Orthopedics;  Laterality: Right;       Family History  Problem Relation Age of Onset   Cancer Father        cause of death   Colon cancer Mother        cause of death    Social History   Tobacco Use   Smoking status: Former    Packs/day: 0.20    Years: 15.00    Pack years: 3.00    Types: Cigarettes    Quit date: 07/01/1968    Years since quitting: 52.9   Smokeless tobacco: Former   Tobacco comments:    Quit 50 yrs ago as of 2014  Vaping Use   Vaping Use: Never used  Substance Use Topics   Alcohol use: No    Alcohol/week: 3.0 standard drinks    Types: 3 Cans of beer per week    Comment: occasionally   Drug use: No    Home Medications Prior to Admission medications   Medication Sig Start Date End Date Taking? Authorizing Provider  acetaminophen (TYLENOL) 650 MG CR tablet Take 650 mg by mouth every  8 (eight) hours as needed for pain.    [provider]  amLODipine (NORVASC) 10 MG tablet amlodipine 10 mg tablet  Take 1 tablet every day by oral route.    [provider]  chlorhexidine (PERIDEX) 0.12 % solution chlorhexidine gluconate 0.12 % mouthwash    [provider]  Cholecalciferol (VITAMIN D-3) 1000 units CAPS Take 1 capsule by mouth daily.     [provider]  Coenzyme Q10 (CO Q 10 PO) Co Q-10 300 mg capsule  Take 1 capsule every day by oral route.    [provider]  furosemide (LASIX) 20 MG tablet Take 1 tablet by mouth daily. 08/23/20   [provider]  hydrALAZINE (APRESOLINE) 25 MG tablet Take 50 mg by mouth 2 (two) times daily. 03/10/20   [provider]  losartan  (COZAAR) 100 MG tablet Take 100 mg by mouth every morning.    [provider]  metoprolol succinate (TOPROL-XL) 25 MG 24 hr tablet Take 12.5 mg by mouth daily.     [provider]  mometasone (NASONEX) 50 MCG/ACT nasal spray 1-2 sprays each nostril daily at bedtime while needed Patient taking differently: Place 1-2 sprays into the nose at bedtime as needed (allergies). 08/12/16   Baird Lyons D, MD  Omega 3 1000 MG CAPS Take 1 capsule by mouth.     [provider]  rosuvastatin (CRESTOR) 20 MG tablet Take 10 mg by mouth every evening.     [provider]  vitamin B-12 (CYANOCOBALAMIN) 100 MCG tablet Take 100 mcg by mouth daily.    [provider]    Allergies    Fosinopril  Review of Systems   Review of Systems  Constitutional:  Positive for fatigue. Negative for activity change, appetite change, chills and fever.  HENT: Negative.    Eyes: Negative.   Respiratory: Negative.    Cardiovascular:  Positive for leg swelling. Negative for chest pain and palpitations.  Gastrointestinal: Negative.   Genitourinary: Negative.   Musculoskeletal:  Positive for myalgias.  Skin: Negative.   Neurological:  Positive for headaches. Negative for dizziness, tremors, weakness and light-headedness.   Physical Exam Updated Vital Signs BP (!) 172/65   Pulse (!) 54   Temp 98.2 F (36.8 C)   Resp 18   Ht 5\' 6"  (1.676 m)   Wt 72.6 kg   SpO2 98%   BMI 25.82 kg/m   Physical Exam Vitals and nursing note reviewed.  Constitutional:      Appearance: He is not ill-appearing or toxic-appearing.  HENT:     Head: Normocephalic and atraumatic.     Nose: Nose normal. No congestion.     Mouth/Throat:     Mouth: Mucous membranes are moist.     Pharynx: Oropharynx is clear. Uvula midline. No oropharyngeal exudate or posterior oropharyngeal erythema.  Eyes:     General: Lids are normal. Vision grossly intact.        Right eye: No discharge.        Left eye: No  discharge.     Extraocular Movements: Extraocular movements intact.     Conjunctiva/sclera: Conjunctivae normal.     Pupils: Pupils are equal, round, and reactive to light.  Neck:     Trachea: Trachea and phonation normal.  Cardiovascular:     Rate and Rhythm: Normal rate and regular rhythm.     Pulses: Normal pulses.     Heart sounds: Normal heart sounds. No murmur heard. Pulmonary:  Effort: Pulmonary effort is normal. No tachypnea, bradypnea, accessory muscle usage, prolonged expiration or respiratory distress.     Breath sounds: Normal breath sounds. No wheezing or rales.  Chest:     Chest wall: No mass, lacerations, deformity, swelling, tenderness, crepitus or edema.  Abdominal:     General: Bowel sounds are normal. There is no distension.     Palpations: Abdomen is soft.     Tenderness: There is no abdominal tenderness. There is no right CVA tenderness, left CVA tenderness, guarding or rebound.  Musculoskeletal:        General: No deformity.     Cervical back: Normal range of motion and neck supple. No edema, rigidity, tenderness or crepitus. No pain with movement, spinous process tenderness or muscular tenderness.     Right lower leg: No edema.     Left lower leg: Edema present.  Lymphadenopathy:     Cervical: No cervical adenopathy.  Skin:    General: Skin is warm and dry.     Capillary Refill: Capillary refill takes less than 2 seconds.     Findings: No rash.  Neurological:     General: No focal deficit present.     Mental Status: He is alert and oriented to person, place, and time. Mental status is at baseline.     GCS: GCS eye subscore is 4. GCS verbal subscore is 5. GCS motor subscore is 6.     Sensory: Sensation is intact.     Motor: Motor function is intact.     Gait: Gait is intact. Gait normal.  Psychiatric:        Mood and Affect: Mood normal.    ED Results / Procedures / Treatments   Labs (all labs ordered are listed, but only abnormal results are  displayed) Labs Reviewed  COMPREHENSIVE METABOLIC PANEL - Abnormal; Notable for the following components:      Result Value   Creatinine, Ser 1.53 (*)    Calcium 8.6 (*)    GFR, Estimated 42 (*)    All other components within normal limits  CBC WITH DIFFERENTIAL/PLATELET - Abnormal; Notable for the following components:   RBC 4.12 (*)    Hemoglobin 12.0 (*)    HCT 37.9 (*)    All other components within normal limits  TROPONIN I (HIGH SENSITIVITY) - Abnormal; Notable for the following components:   Troponin I (High Sensitivity) 24 (*)    All other components within normal limits  URINALYSIS, ROUTINE W REFLEX MICROSCOPIC  TROPONIN I (HIGH SENSITIVITY)    EKG EKG Interpretation  Date/Time:  Saturday May 26 2021 15:44:16 EST Ventricular Rate:  58 PR Interval:  209 QRS Duration: 97 QT Interval:  477 QTC Calculation: 469 R Axis:   28 Text Interpretation: Sinus rhythm Atrial premature complex Confirmed by Nanda Quinton [phone]) on 05/26/2021 3:50:26 PM  Radiology VAS [place] LOWER EXTREMITY VENOUS (DVT) (7a-7p)  Result Date: 05/26/2021  Lower Venous DVT Study Patient Name:  JARREAU CALLANAN  Date of Exam:   05/26/2021 Medical Rec #: [id number]         Accession #:    [id number] Date of Birth: April 10, 1927          Patient Gender: M Patient Age:   11 years Exam Location:  [facility] Procedure:      VAS [place] LOWER EXTREMITY VENOUS (DVT) Referring Phys: [facility] L[facility]is Anacker --------------------------------------------------------------------------------  Indications: Left leg pain.  Comparison Study: 03-28-2021 Most recent left lower extremity venous was  negative for DVT. Performing Technologist: Darlin Coco RDMS, RVT  Examination Guidelines: A complete evaluation includes B-mode imaging, spectral Doppler, color Doppler, and power Doppler as needed of all accessible portions of each vessel. Bilateral testing is considered an integral part of a complete examination.  Limited examinations for reoccurring indications may be performed as noted. The reflux portion of the exam is performed with the patient in reverse Trendelenburg.  +-----+---------------+---------+-----------+----------+--------------+ RIGHTCompressibilityPhasicitySpontaneityPropertiesThrombus Aging +-----+---------------+---------+-----------+----------+--------------+ CFV  Full           Yes      Yes                                 +-----+---------------+---------+-----------+----------+--------------+   +---------+---------------+---------+-----------+----------+-------------------+ LEFT     CompressibilityPhasicitySpontaneityPropertiesThrombus Aging      +---------+---------------+---------+-----------+----------+-------------------+ CFV      Full           Yes      Yes                                      +---------+---------------+---------+-----------+----------+-------------------+ SFJ      Full                                                             +---------+---------------+---------+-----------+----------+-------------------+ FV Prox  Full                                                             +---------+---------------+---------+-----------+----------+-------------------+ FV Mid   Full                                                             +---------+---------------+---------+-----------+----------+-------------------+ FV DistalFull                                                             +---------+---------------+---------+-----------+----------+-------------------+ PFV      Full                                                             +---------+---------------+---------+-----------+----------+-------------------+ POP      Full           Yes      Yes                  Prolonged flow  reversal noted       +---------+---------------+---------+-----------+----------+-------------------+ PTV      Full                                                             +---------+---------------+---------+-----------+----------+-------------------+ PERO     Full                                                             +---------+---------------+---------+-----------+----------+-------------------+ Gastroc  Full                                                             +---------+---------------+---------+-----------+----------+-------------------+    Summary: RIGHT: - No evidence of common femoral vein obstruction.  LEFT: - There is no evidence of deep vein thrombosis in the lower extremity.  - No cystic structure found in the popliteal fossa.  *See table(s) above for measurements and observations.    Preliminary     Procedures Procedures   Medications Ordered in ED Medications - No data to display  ED Course  I have reviewed the triage vital signs and the nursing notes.  Pertinent labs & imaging results that were available during my care of the patient were reviewed by me and considered in my medical decision making (see chart for details).    MDM Rules/Calculators/A&P                         87 year old male who presents with concern.  Vitals are normal on intake.  Cardiopulmonary exam is normal, abdominal exam is benign.  Patient is with normal HEENT exam.  Left lower extremity with tender palpation in the left posterior calf.  Cognitively intact, patient is neurovascular intact in all 4 extremities.  CBC with mild anemia with hemoglobin 12 at patient's baseline.  CMP with creatinine of 1.5 at patient's baseline.  UA without infection.  Troponin of 24 at patient's baseline.  EKG nonischemic, and his DVT study of the left lower extremity is negative. Refused flu test.   Patient reevaluated and is feeling well, states he was primarily here for reassurance and will follow up with  his primary care doctor.  No further work-up warranted near at this time.  Larnie voiced understanding of his medical evaluation and treatment plan.  His questions was answered to his expressed satisfaction.  Return precautions given.  Patient is well-appearing, stable, appropriate for discharge at this time.   This chart was dictated using voice recognition software, Dragon. Despite the best efforts of this provider to proofread and correct errors, errors may still occur which can change documentation meaning.  Final Clinical Impression(s) / ED Diagnoses Final diagnoses:  Weakness    Rx / DC Orders ED Discharge Orders     None        Emeline Darling, PA-C 05/26/21 [place], [place], DO 05/27/21 1614

## 2021-05-27 NOTE — Progress Notes (Signed)
Cardiology Office Note:   Date:  05/28/2021  NAME:  Christopher Shepherd    MRN: [id number] DOB:  09-15-1926   PCP:  Willey Blade, MD  Cardiologist:  None  Electrophysiologist:  None   Referring MD: Willey Blade, MD   Chief Complaint  Patient presents with   Follow-up    1 year.   Headache    A little light headed in the morning.   Edema    Legs.   History of Present Illness:   Christopher Shepherd is a 87 y.o. male with a hx of PACs, orthostatic hypotension, 1AVB, OSA who presents for follow-up.  He has been to the emergency room several times for left leg pain.  He describes burning in his leg.  It occurs at any time.  Occurs in the left upper thigh area.  He reports it is better with leg elevation.  He also reports leg swelling.  This is better with leg compressions stockings.  Also improved with leg elevation.  He was taken off amlodipine.  He has had no dizziness or lightheadedness.  He is working on drinking more water.  He reports no chest pain or trouble breathing.  No limitation with activity.  His blood pressure values have ranged between 846-962 systolically.  They actually improve when he takes his blood pressure medications.  It is well controlled today 138/50.  Cardiovascular examination remains normal.  He does have PACs in his history but no issues.  Problem List 1. Advanced age 80. Orthostatic hypotension  3. HTN 4. HLD 5. PACs 6. 1AVB 7. OSA  Past Medical History: Past Medical History:  Diagnosis Date   Allergic rhinitis    Anemia    Arthritis    osteoarthritis   Asthma 1994   with bronchitis   Cough    Hyperlipidemia    Hypertension    Obstructive sleep apnea    uses a C-Pap    Past Surgical History: Past Surgical History:  Procedure Laterality Date   COLONOSCOPY W/ POLYPECTOMY     none     TOTAL KNEE ARTHROPLASTY Right 10/04/2013   Procedure: RIGHT TOTAL KNEE ARTHROPLASTY;  Surgeon: Gearlean Alf, MD;  Location: [facility];  Service: Orthopedics;   Laterality: Right;    Current Medications: Current Meds  Medication Sig   acetaminophen (TYLENOL) 650 MG CR tablet Take 650 mg by mouth every 8 (eight) hours as needed for pain.   chlorhexidine (PERIDEX) 0.12 % solution chlorhexidine gluconate 0.12 % mouthwash   Cholecalciferol (VITAMIN D-3) 1000 units CAPS Take 1 capsule by mouth daily.    Coenzyme Q10 (CO Q 10 PO) Co Q-10 300 mg capsule  Take 1 capsule every day by oral route.   furosemide (LASIX) 20 MG tablet Take 1 tablet by mouth daily.   hydrALAZINE (APRESOLINE) 25 MG tablet Take 50 mg by mouth 2 (two) times daily.   losartan (COZAAR) 100 MG tablet Take 100 mg by mouth every morning.   metoprolol succinate (TOPROL-XL) 25 MG 24 hr tablet Take 12.5 mg by mouth daily.    mometasone (NASONEX) 50 MCG/ACT nasal spray 1-2 sprays each nostril daily at bedtime while needed (Patient taking differently: Place 1-2 sprays into the nose at bedtime as needed (allergies).)   Omega 3 1000 MG CAPS Take 1 capsule by mouth.    rosuvastatin (CRESTOR) 20 MG tablet Take 10 mg by mouth every evening.    vitamin B-12 (CYANOCOBALAMIN) 100 MCG tablet Take 100 mcg by mouth daily.   [DISCONTINUED]  amLODipine (NORVASC) 10 MG tablet amlodipine 10 mg tablet  Take 1 tablet every day by oral route.     Allergies:    Fosinopril   Social History: Social History   Socioeconomic History   Marital status: Married    Spouse name: Not on file   Number of children: 2   Years of education: Not on file   Highest education level: Not on file  Occupational History   Not on file  Tobacco Use   Smoking status: Former    Packs/day: 0.20    Years: 15.00    Pack years: 3.00    Types: Cigarettes    Quit date: 07/01/1968    Years since quitting: 52.9   Smokeless tobacco: Former   Tobacco comments:    Quit 50 yrs ago as of 2014  Vaping Use   Vaping Use: Never used  Substance and Sexual Activity   Alcohol use: No    Alcohol/week: 3.0 standard drinks    Types: 3  Cans of beer per week    Comment: occasionally   Drug use: No   Sexual activity: Not on file  Other Topics Concern   Not on file  Social History Narrative   Not on file   Social Determinants of Health   Financial Resource Strain: Not on file  Food Insecurity: Not on file  Transportation Needs: Not on file  Physical Activity: Not on file  Stress: Not on file  Social Connections: Not on file     Family History: The patient's family history includes Cancer in his father; Colon cancer in his mother.  ROS:   All other ROS reviewed and negative. Pertinent positives noted in the HPI.     EKGs/Labs/Other Studies Reviewed:   The following studies were personally reviewed by me today:  TTE 02/17/2020  1. Left ventricular ejection fraction, by estimation, is 55 to 60%. The  left ventricle has normal function. The left ventricle has no regional  wall motion abnormalities. There is moderate eccentric left ventricular  hypertrophy. Left ventricular  diastolic parameters are consistent with Grade II diastolic dysfunction  (pseudonormalization).   2. Right ventricular systolic function is normal. The right ventricular  size is normal. There is normal pulmonary artery systolic pressure.   3. Left atrial size was moderately dilated.   4. Right atrial size was moderately dilated.   5. The mitral valve is normal in structure. Trivial mitral valve  regurgitation.   6. The aortic valve is tricuspid. Aortic valve regurgitation is not  visualized. No aortic stenosis is present.   7. The inferior vena cava is normal in size with greater than 50%  respiratory variability, suggesting right atrial pressure of 3 mmHg.   Recent Labs: 02/03/2021: B Natriuretic Peptide 275.1 05/26/2021: ALT 31; BUN 21; Creatinine, Ser 1.53; Hemoglobin 12.0; Platelets 169; Potassium 3.6; Sodium 140   Recent Lipid Panel No results found for: CHOL, TRIG, HDL, CHOLHDL, VLDL, LDLCALC, LDLDIRECT  Physical Exam:   VS:   BP (!) 138/50 (BP Location: Left Arm, Patient Position: Sitting, Cuff Size: Normal)   Pulse 71   Ht 5\' 6"  (1.676 m)   Wt 170 lb (77.1 kg)   BMI 27.44 kg/m    Wt Readings from Last 3 Encounters:  05/28/21 170 lb (77.1 kg)  05/26/21 160 lb (72.6 kg)  03/21/21 182 lb 12.2 oz (82.9 kg)    General: Well nourished, well developed, in no acute distress Head: Atraumatic, normal size  Eyes: PEERLA, EOMI  Neck: Supple, no JVD Endocrine: No thryomegaly Cardiac: Normal S1, S2; RRR; no murmurs, rubs, or gallops Lungs: Clear to auscultation bilaterally, no wheezing, rhonchi or rales  Abd: Soft, nontender, no hepatomegaly  Ext: No edema, pulses 2+ Musculoskeletal: No deformities, BUE and BLE strength normal and equal Skin: Warm and dry, no rashes   Neuro: Alert and oriented to person, place, time, and situation, CNII-XII grossly intact, no focal deficits  Psych: Normal mood and affect   ASSESSMENT:   Christopher Shepherd is a 87 y.o. male who presents for the following: 1. Leg pain, anterior, left   2. Primary hypertension   3. Orthostatic hypotension     PLAN:   1. Leg pain, anterior, left -Seen in the emergency room several times for left leg pain.  Suspect this is musculoskeletal.  He has 2+ pulses.  No concerns for PAD.  Symptoms are also inconsistent with claudication.  Would recommend he take a conservative approach and take Tylenol as needed.  He will do this.  2. Primary hypertension 3. Orthostatic hypotension -Blood pressure well controlled.  He was seen last year for orthostatic hypotension.  He was not drinking of water.  He is now consuming more water.  Not really having any symptoms of this.  He has occasional lower extremity edema that is improved with compression stockings as well as leg elevation.  This is benign.  His echocardiogram was normal.  We discussed that his blood pressure can be leniently controlled given his age.  He will see [place] as needed moving forward.       Disposition: Return if symptoms worsen or fail to improve.  Medication Adjustments/Labs and Tests Ordered: Current medicines are reviewed at length with the patient today.  Concerns regarding medicines are outlined above.  No orders of the defined types were placed in this encounter.  No orders of the defined types were placed in this encounter.   Patient Instructions  Medication Instructions:  The current medical regimen is effective;  continue present plan and medications.  *If you need a refill on your cardiac medications before your next appointment, please call your pharmacy*   Follow-Up: At [facility], you and your health needs are our priority.  As part of our continuing mission to provide you with exceptional heart care, we have created designated Provider Care Teams.  These Care Teams include your primary Cardiologist (physician) and Advanced Practice Providers (APPs -  Physician Assistants and Nurse Practitioners) who all work together to provide you with the care you need, when you need it.  We recommend signing up for the patient portal called "MyChart".  Sign up information is provided on this After Visit Summary.  MyChart is used to connect with patients for Virtual Visits (Telemedicine).  Patients are able to view lab/test results, encounter notes, upcoming appointments, etc.  Non-urgent messages can be sent to your provider as well.   To learn more about what you can do with MyChart, go to [email or web address].    Your next appointment:   As needed  The format for your next appointment:   In Person  Provider:   Eleonore Chiquito, MD     Time Spent with Patient: I have spent a total of 25 minutes with patient reviewing hospital notes, telemetry, EKGs, labs and examining the patient as well as establishing an assessment and plan that was discussed with the patient.  > 50% of time was spent in direct patient care.  Signed, Addison Naegeli. Audie Box, MD, [facility]   [facility]  [address] [place], [place] [address] [phone]  05/28/2021 10:22 AM

## 2021-05-28 ENCOUNTER — Other Ambulatory Visit: Payer: Self-pay

## 2021-05-28 ENCOUNTER — Encounter: Payer: Self-pay | Admitting: Cardiovascular Disease

## 2021-05-28 ENCOUNTER — Ambulatory Visit (INDEPENDENT_AMBULATORY_CARE_PROVIDER_SITE_OTHER): Payer: Medicare Other | Admitting: Cardiovascular Disease

## 2021-05-28 VITALS — BP 138/50 | HR 71 | Ht 66.0 in | Wt 170.0 lb

## 2021-05-28 DIAGNOSIS — M79605 Pain in left leg: Secondary | ICD-10-CM

## 2021-05-28 DIAGNOSIS — I951 Orthostatic hypotension: Secondary | ICD-10-CM

## 2021-05-28 DIAGNOSIS — I10 Essential (primary) hypertension: Secondary | ICD-10-CM | POA: Diagnosis not present

## 2021-05-28 NOTE — Patient Instructions (Signed)
Medication Instructions:  The current medical regimen is effective;  continue present plan and medications.  *If you need a refill on your cardiac medications before your next appointment, please call your pharmacy*    Follow-Up: At [facility], you and your health needs are our priority.  As part of our continuing mission to provide you with exceptional heart care, we have created designated Provider Care Teams.  These Care Teams include your primary Cardiologist (physician) and Advanced Practice Providers (APPs -  Physician Assistants and Nurse Practitioners) who all work together to provide you with the care you need, when you need it.  We recommend signing up for the patient portal called "MyChart".  Sign up information is provided on this After Visit Summary.  MyChart is used to connect with patients for Virtual Visits (Telemedicine).  Patients are able to view lab/test results, encounter notes, upcoming appointments, etc.  Non-urgent messages can be sent to your provider as well.   To learn more about what you can do with MyChart, go to [email or web address].    Your next appointment:   As needed  The format for your next appointment:   In Person  Provider:   Lucerne O'Neal, MD      

## 2021-06-10 ENCOUNTER — Encounter (HOSPITAL_COMMUNITY): Payer: Self-pay

## 2021-06-10 ENCOUNTER — Other Ambulatory Visit: Payer: Self-pay

## 2021-06-10 ENCOUNTER — Emergency Department (HOSPITAL_COMMUNITY)
Admission: EM | Admit: 2021-06-10 | Discharge: 2021-06-10 | Disposition: A | Discharge status: Home / Self Care | Payer: No Typology Code available for payment source | Attending: Emergency Medicine | Admitting: Emergency Medicine

## 2021-06-10 DIAGNOSIS — Z87891 Personal history of nicotine dependence: Secondary | ICD-10-CM | POA: Diagnosis not present

## 2021-06-10 DIAGNOSIS — M79605 Pain in left leg: Secondary | ICD-10-CM | POA: Insufficient documentation

## 2021-06-10 DIAGNOSIS — I129 Hypertensive chronic kidney disease with stage 1 through stage 4 chronic kidney disease, or unspecified chronic kidney disease: Secondary | ICD-10-CM | POA: Insufficient documentation

## 2021-06-10 DIAGNOSIS — Z96651 Presence of right artificial knee joint: Secondary | ICD-10-CM | POA: Diagnosis not present

## 2021-06-10 DIAGNOSIS — Z79899 Other long term (current) drug therapy: Secondary | ICD-10-CM | POA: Diagnosis not present

## 2021-06-10 DIAGNOSIS — N189 Chronic kidney disease, unspecified: Secondary | ICD-10-CM | POA: Insufficient documentation

## 2021-06-10 DIAGNOSIS — J45909 Unspecified asthma, uncomplicated: Secondary | ICD-10-CM | POA: Diagnosis not present

## 2021-06-10 NOTE — ED Triage Notes (Signed)
Pt reports left leg soreness since last visit in 11/26 that has become progressively worse. He also endorses lower back pain.

## 2021-06-10 NOTE — ED Provider Notes (Signed)
[facility] DEPT Provider Note   CSN: [id number] Arrival date & time: 06/10/21  1555     History Chief Complaint  Patient presents with   Leg Pain    Nagee Goates is a 87 y.o. male.  87 year old male with prior medical history as detailed below presents for evaluation.  Patient with longstanding history of left leg discomfort.  Patient reports that his left leg was bothering him more this morning.  Patient reports tingling burning to the left lateral thigh.  This morning his symptoms were worse and that the tingling burning extended all the way down to his ankle.  Patient is able to ambulate with his cane at baseline.  He denies difficulty ambulating today.  He denies associated chest pain, shortness of breath, fever, back pain, or other complaint.  He usually takes some Tylenol for his leg pain.  He has been to the ED and has also mentioned his symptoms to his cardiologist.  Prior ED evaluation has been without evidence of significant acute pathology.  The history is provided by the patient.  Leg Pain Location:  Leg Time since incident:  4 weeks Injury: no   Leg location:  L leg Pain details:    Quality:  Sharp and aching   Radiates to:  Does not radiate   Severity:  Mild   Onset quality:  Unable to specify   Timing:  Unable to specify   Progression:  Unable to specify     Past Medical History:  Diagnosis Date   Allergic rhinitis    Anemia    Arthritis    osteoarthritis   Asthma 1994   with bronchitis   Cough    Hyperlipidemia    Hypertension    Obstructive sleep apnea    uses a C-Pap    Patient Active Problem List   Diagnosis Date Noted   Encounter for fitting and adjustment of hearing aid 12/13/2020   Abnormal glucose level 09/06/2020   Artificial knee joint present 09/06/2020   Chronic kidney disease 09/06/2020   Edema 09/06/2020   Obesity 09/06/2020   Polyphagia 09/06/2020   Primary localized osteoarthrosis, lower leg  09/06/2020   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   Chronic asthmatic bronchitis with acute exacerbation ([place]) 07/16/2014   Pain in lower limb 11/24/2013   S/P total knee arthroplasty 10/23/2013   Hyperlipidemia 10/08/2013   Constipation 10/08/2013   Essential hypertension, benign 10/08/2013   Hiccups 10/08/2013   Acute blood loss anemia 10/06/2013   OA (osteoarthritis) of knee 10/04/2013   Onychomycosis 10/30/2012   Pain in joint, ankle and foot 10/30/2012   Allergic rhinitis due to pollen 09/05/2010   Vitamin D deficiency 03/03/2009   Obstructive sleep apnea 04/16/2007   Allergic-infective asthma 04/16/2007   COUGH 04/16/2007   History of colonic polyps 08/06/2005    Past Surgical History:  Procedure Laterality Date   COLONOSCOPY W/ POLYPECTOMY     none     TOTAL KNEE ARTHROPLASTY Right 10/04/2013   Procedure: RIGHT TOTAL KNEE ARTHROPLASTY;  Surgeon: Gearlean Alf, MD;  Location: [facility];  Service: Orthopedics;  Laterality: Right;       Family History  Problem Relation Age of Onset   Cancer Father        cause of death   Colon cancer Mother        cause of death    Social History   Tobacco Use   Smoking status: Former    Packs/day: 0.20  Years: 15.00    Pack years: 3.00    Types: Cigarettes    Quit date: 07/01/1968    Years since quitting: 52.9   Smokeless tobacco: Former   Tobacco comments:    Quit 50 yrs ago as of 2014  Vaping Use   Vaping Use: Never used  Substance Use Topics   Alcohol use: No    Alcohol/week: 3.0 standard drinks    Types: 3 Cans of beer per week    Comment: occasionally   Drug use: No    Home Medications Prior to Admission medications   Medication Sig Start Date End Date Taking? Authorizing Provider  acetaminophen (TYLENOL) 650 MG CR tablet Take 650 mg by mouth every 8 (eight) hours as needed for pain.    [provider]  chlorhexidine (PERIDEX) 0.12 % solution chlorhexidine gluconate 0.12 % mouthwash     [provider]  Cholecalciferol (VITAMIN D-3) 1000 units CAPS Take 1 capsule by mouth daily.     [provider]  Coenzyme Q10 (CO Q 10 PO) Co Q-10 300 mg capsule  Take 1 capsule every day by oral route.    [provider]  furosemide (LASIX) 20 MG tablet Take 1 tablet by mouth daily. 08/23/20   [provider]  hydrALAZINE (APRESOLINE) 25 MG tablet Take 50 mg by mouth 2 (two) times daily. 03/10/20   [provider]  losartan (COZAAR) 100 MG tablet Take 100 mg by mouth every morning.    [provider]  metoprolol succinate (TOPROL-XL) 25 MG 24 hr tablet Take 12.5 mg by mouth daily.     [provider]  mometasone (NASONEX) 50 MCG/ACT nasal spray 1-2 sprays each nostril daily at bedtime while needed Patient taking differently: Place 1-2 sprays into the nose at bedtime as needed (allergies). 08/12/16   Baird Lyons D, MD  Omega 3 1000 MG CAPS Take 1 capsule by mouth.     [provider]  rosuvastatin (CRESTOR) 20 MG tablet Take 10 mg by mouth every evening.     [provider]  vitamin B-12 (CYANOCOBALAMIN) 100 MCG tablet Take 100 mcg by mouth daily.    [provider]    Allergies    Fosinopril  Review of Systems   Review of Systems  All other systems reviewed and are negative.  Physical Exam Updated Vital Signs BP (!) 132/54 (BP Location: Left Arm)   Pulse 71   Temp 97.7 F (36.5 C) (Oral)   Resp 18   SpO2 99%   Physical Exam Vitals and nursing note reviewed.  Constitutional:      General: He is not in acute distress.    Appearance: Normal appearance. He is well-developed.  HENT:     Head: Normocephalic and atraumatic.  Eyes:     Conjunctiva/sclera: Conjunctivae normal.     Pupils: Pupils are equal, round, and reactive to light.  Cardiovascular:     Rate and Rhythm: Normal rate and regular rhythm.     Heart sounds: Normal heart sounds.  Pulmonary:     Effort: Pulmonary effort is  normal. No respiratory distress.     Breath sounds: Normal breath sounds.  Abdominal:     General: There is no distension.     Palpations: Abdomen is soft.     Tenderness: There is no abdominal tenderness.  Musculoskeletal:        General: No tenderness or deformity. Normal range of motion.     Cervical back: Normal range of  motion and neck supple.  Skin:    General: Skin is warm and dry.  Neurological:     General: No focal deficit present.     Mental Status: He is alert and oriented to person, place, and time.     Coordination: Coordination normal.     Gait: Gait normal.    ED Results / Procedures / Treatments   Labs (all labs ordered are listed, but only abnormal results are displayed) Labs Reviewed - No data to display  EKG None  Radiology No results found.  Procedures Procedures   Medications Ordered in ED Medications - No data to display  ED Course  I have reviewed the triage vital signs and the nursing notes.  Pertinent labs & imaging results that were available during my care of the patient were reviewed by me and considered in my medical decision making (see chart for details).    MDM Rules/Calculators/A&P                           MDM  MSE complete  Antwione Picotte was evaluated in Emergency Department on 06/10/2021 for the symptoms described in the history of present illness. He was evaluated in the context of the global COVID-19 pandemic, which necessitated consideration that the patient might be at risk for infection with the SARS-CoV-2 virus that causes COVID-19. Institutional protocols and algorithms that pertain to the evaluation of patients at risk for COVID-19 are in a state of rapid change based on information released by regulatory bodies including the CDC and federal and state organizations. These policies and algorithms were followed during the patient's care in the ED.  Patient with longstanding history of intermittent left leg  pain.  Patient complains of recurrent symptoms today.  Patient with multiple recent ED evaluations without significant pathology identified.  Patient's presentation is most suggestive of likely sciatica.  Patient is reassured after history and exam.  He declines additional pain medicine here in the ED.  He appears to be comfortable.  He does understand need for close follow-up with his PCP.  Importance of close follow-up is stressed.  Strict return precautions given and understood.   Final Clinical Impression(s) / ED Diagnoses Final diagnoses:  Left leg pain    Rx / DC Orders ED Discharge Orders     None        Valarie Merino, MD 06/10/21 [phone]

## 2021-06-10 NOTE — Discharge Instructions (Signed)
Return for any problem.  ?

## 2021-07-03 ENCOUNTER — Ambulatory Visit: Payer: Medicare Other | Admitting: Podiatry

## 2021-07-12 ENCOUNTER — Ambulatory Visit
Admission: EM | Admit: 2021-07-12 | Discharge: 2021-07-12 | Disposition: A | Discharge status: Home / Self Care | Payer: Medicare Other

## 2021-07-12 DIAGNOSIS — J309 Allergic rhinitis, unspecified: Secondary | ICD-10-CM | POA: Diagnosis not present

## 2021-07-12 DIAGNOSIS — G8929 Other chronic pain: Secondary | ICD-10-CM | POA: Diagnosis not present

## 2021-07-12 DIAGNOSIS — M5442 Lumbago with sciatica, left side: Secondary | ICD-10-CM

## 2021-07-12 MED ORDER — METHYLPREDNISOLONE 4 MG PO TBPK: ORAL_TABLET | ORAL | 0 refills | Status: DC

## 2021-07-12 MED ORDER — CETIRIZINE HCL 5 MG PO TABS: 5.0000 mg | ORAL_TABLET | Freq: Every day | ORAL | 1 refills | Status: DC

## 2021-07-12 NOTE — ED Triage Notes (Signed)
Pt report having pain to left leg with warmness.

## 2021-07-12 NOTE — ED Provider Notes (Signed)
[facility]    CSN: [id number] Arrival date & time: 07/12/21  1343    HISTORY   Chief Complaint  Patient presents with   Leg Pain   HPI Christopher Shepherd is a 88 y.o. male. Patient reports a history of left sciatic nerve pain, states has been going on for about 6 months.  Patient states symptoms wax and wane.  Patient states he has a warm burning sensation sometimes in his lateral left thigh, sometimes in the anterior aspect of his left lower leg, patient states he has a history of arthritis and total knee replacement in his right knee, walks with a cane at this time and tends to favor his right leg.  Patient states at night his leg feels jumpy, burns, has difficulty lying on his left side.  Patient states he also has intermittent swelling in his left lower extremity.  Patient complains of waking up in the morning with a hoarse voice and mucus in his throat.  EMR reviewed, patient has a previous prescription for mometasone nasal spray.  Patient states he is not currently using it.  The history is provided by the patient.  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   Chronic asthmatic bronchitis with acute exacerbation ([place]) 07/16/2014   Pain in lower limb 11/24/2013   S/P total knee arthroplasty 10/23/2013   Hyperlipidemia 10/08/2013   Constipation 10/08/2013   Essential hypertension,  benign 10/08/2013   Hiccups 10/08/2013   Acute blood loss anemia 10/06/2013   OA (osteoarthritis) of knee 10/04/2013   Onychomycosis 10/30/2012   Pain in joint, ankle and foot 10/30/2012   Allergic rhinitis due to pollen 09/05/2010   Vitamin D deficiency 03/03/2009   Obstructive sleep apnea 04/16/2007   Allergic-infective asthma 04/16/2007   COUGH 04/16/2007   History of colonic polyps 08/06/2005   Past Surgical History:  Procedure Laterality Date   COLONOSCOPY W/ POLYPECTOMY     none     TOTAL KNEE ARTHROPLASTY Right 10/04/2013   Procedure: RIGHT TOTAL KNEE ARTHROPLASTY;  Surgeon: Gearlean Alf, MD;  Location: [facility];  Service: Orthopedics;  Laterality: Right;    Home Medications    Prior to Admission medications   Medication Sig Start Date End Date Taking? Authorizing Provider  amLODipine (NORVASC) 10 MG tablet Take 10 mg by mouth daily.   Yes [provider]  cetirizine (ZYRTEC) 5 MG tablet Take 1 tablet (5 mg total) by mouth at bedtime. 07/12/21 09/10/21 Yes Lynden Oxford Scales, PA-C  methylPREDNISolone (MEDROL DOSEPAK) 4 MG TBPK tablet Take 24 mg on day 1, 20 mg on day 2, 16 mg on day 3, 12 mg on day 4, 8 mg on day 5, 4 mg on day 6. 07/12/21  Yes Lynden Oxford Scales, PA-C  acetaminophen (TYLENOL) 650 MG CR tablet Take 650 mg by mouth  every 8 (eight) hours as needed for pain.    [provider]  chlorhexidine (PERIDEX) 0.12 % solution chlorhexidine gluconate 0.12 % mouthwash    [provider]  Cholecalciferol (VITAMIN D-3) 1000 units CAPS Take 1 capsule by mouth daily.     [provider]  Coenzyme Q10 (CO Q 10 PO) Co Q-10 300 mg capsule  Take 1 capsule every day by oral route.    [provider]  furosemide (LASIX) 20 MG tablet Take 1 tablet by mouth daily. 08/23/20   [provider]  hydrALAZINE (APRESOLINE) 25 MG tablet Take 50 mg by mouth 2 (two) times daily. 03/10/20   [provider]  losartan (COZAAR) 100  MG tablet Take 100 mg by mouth every morning.    [provider]  metoprolol succinate (TOPROL-XL) 25 MG 24 hr tablet Take 12.5 mg by mouth daily.     [provider]  mometasone (NASONEX) 50 MCG/ACT nasal spray 1-2 sprays each nostril daily at bedtime while needed Patient taking differently: Place 1-2 sprays into the nose at bedtime as needed (allergies). 08/12/16   Baird Lyons D, MD  Omega 3 1000 MG CAPS Take 1 capsule by mouth.     [provider]  rosuvastatin (CRESTOR) 20 MG tablet Take 10 mg by mouth every evening.     [provider]  vitamin B-12 (CYANOCOBALAMIN) 100 MCG tablet Take 100 mcg by mouth daily.    [provider]   Family History Family History  Problem Relation Age of Onset   Cancer Father        cause of death   Colon cancer Mother        cause of death   Social History Social History   Tobacco Use   Smoking status: Former    Packs/day: 0.20    Years: 15.00    Pack years: 3.00    Types: Cigarettes    Quit date: 07/01/1968    Years since quitting: 53.0   Smokeless tobacco: Former   Tobacco comments:    Quit 50 yrs ago as of 2014  Vaping Use   Vaping Use: Never used  Substance Use Topics   Alcohol use: No    Alcohol/week: 3.0 standard drinks    Types: 3 Cans of beer per week    Comment: occasionally   Drug use: No   Allergies   Fosinopril  Review of Systems Review of Systems Pertinent findings noted in history of present illness.   Physical Exam Triage Vital Signs ED Triage Vitals  Enc Vitals Group     BP 04/27/21 0827 (!) 147/82     Pulse Rate 04/27/21 0827 72     Resp 04/27/21 0827 18     Temp 04/27/21 0827 98.3 F (36.8 C)     Temp Source 04/27/21 0827 Oral     SpO2 04/27/21 0827 98 %     Weight --      Height --      Head Circumference --      Peak Flow --      Pain Score 04/27/21 0826 5     Pain Loc --      Pain Edu? --      Excl. in [place]? --   No data found.  Updated Vital Signs BP  (!) 130/56 (BP Location: Left Arm)    Pulse 65    Temp 98.1 F (36.7 C) (Oral)    Resp 16    SpO2  98%   Physical Exam Vitals and nursing note reviewed.  Constitutional:      General: He is not in acute distress.    Appearance: Normal appearance. He is not ill-appearing.  HENT:     Head: Normocephalic and atraumatic.     Right Ear: External ear normal. A middle ear effusion is present. Tympanic membrane is bulging. Tympanic membrane is not injected or erythematous.     Left Ear: External ear normal. A middle ear effusion is present. Tympanic membrane is bulging. Tympanic membrane is not injected or erythematous.     Ears:     Comments: Bilateral EACs normal, both TMs bulging with clear fluid    Nose: Rhinorrhea present. No nasal deformity, septal deviation, signs of injury or nasal tenderness. Rhinorrhea is clear.     Right Nostril: Occlusion present. No foreign body, epistaxis or septal hematoma.     Left Nostril: Occlusion present. No foreign body, epistaxis or septal hematoma.     Right Turbinates: Enlarged, swollen and pale.     Left Turbinates: Enlarged, swollen and pale.     Right Sinus: No maxillary sinus tenderness or frontal sinus tenderness.     Left Sinus: No maxillary sinus tenderness or frontal sinus tenderness.     Mouth/Throat:     Comments: Postnasal drip Eyes:     General: Lids are normal.        Right eye: No discharge.        Left eye: No discharge.     Extraocular Movements: Extraocular movements intact.     Conjunctiva/sclera: Conjunctivae normal.     Right eye: Right conjunctiva is not injected.     Left eye: Left conjunctiva is not injected.  Neck:     Trachea: Trachea and phonation normal.  Cardiovascular:     Rate and Rhythm: Normal rate and regular rhythm.     Pulses: Normal pulses.     Heart sounds: Normal heart sounds. No murmur heard.   No friction rub. No gallop.  Pulmonary:     Effort: Pulmonary effort is normal. No accessory muscle usage,  prolonged expiration or respiratory distress.     Breath sounds: Normal breath sounds. No stridor, decreased air movement or transmitted upper airway sounds. No decreased breath sounds, wheezing, rhonchi or rales.  Chest:     Chest wall: No tenderness.  Musculoskeletal:        General: No swelling, tenderness, deformity or signs of injury. Normal range of motion.     Cervical back: Normal range of motion and neck supple. Normal range of motion.     Right lower leg: No edema.     Left lower leg: No edema.     Comments: There is no reproducible pain in his left leg on exam.  Patient does have mild tenderness at his left SI joint.  Lymphadenopathy:     Cervical: No cervical adenopathy.  Skin:    General: Skin is warm and dry.     Findings: No erythema or rash.  Neurological:     General: No focal deficit present.     Mental Status: He is alert and oriented to person, place, and time.  Psychiatric:        Mood and Affect: Mood normal.        Behavior: Behavior normal.    Visual Acuity Right Eye Distance:   Left Eye Distance:   Bilateral Distance:    Right Eye Near:   Left Eye Near:    Bilateral  Near:     UC Couse / Diagnostics / Procedures:    EKG  Radiology No results found.  Procedures Procedures (including critical care time)  UC Diagnoses / Final Clinical Impressions(s)   I have reviewed the triage vital signs and the nursing notes.  Pertinent labs & imaging results that were available during my care of the patient were reviewed by me and considered in my medical decision making (see chart for details).   Final diagnoses:  Chronic left-sided low back pain with left-sided sciatica  Allergic rhinitis, unspecified seasonality, unspecified trigger   Patient states that in the past he has had good results with a Medrol Dosepak.  I renewed this for him.  In addition to mometasone nightly, I recommend that he also begin Zyrtec 5 mg every night for his allergy symptoms.   Patient advised to follow-up with his primary care provider for these issues.  ED Prescriptions     Medication Sig Dispense Auth. Provider   methylPREDNISolone (MEDROL DOSEPAK) 4 MG TBPK tablet Take 24 mg on day 1, 20 mg on day 2, 16 mg on day 3, 12 mg on day 4, 8 mg on day 5, 4 mg on day 6. 21 tablet Lynden Oxford Scales, PA-C   cetirizine (ZYRTEC) 5 MG tablet Take 1 tablet (5 mg total) by mouth at bedtime. 30 tablet Lynden Oxford Scales, PA-C      PDMP not reviewed this encounter.  Pending results:  Labs Reviewed - No data to display  Medications Ordered in UC: Medications - No data to display  Disposition Upon Discharge:  Condition: stable for discharge home Home: take medications as prescribed; routine discharge instructions as discussed; follow up as advised.  Patient presented with an acute illness with associated systemic symptoms and significant discomfort requiring urgent management. In my opinion, this is a condition that a prudent lay person (someone who possesses an average knowledge of health and medicine) may potentially expect to result in complications if not addressed urgently such as respiratory distress, impairment of bodily function or dysfunction of bodily organs.   Routine symptom specific, illness specific and/or disease specific instructions were discussed with the patient and/or caregiver at length.   As such, the patient has been evaluated and assessed, work-up was performed and treatment was provided in alignment with urgent care protocols and evidence based medicine.  Patient/parent/caregiver has been advised that the patient may require follow up for further testing and treatment if the symptoms continue in spite of treatment, as clinically indicated and appropriate.  If the patient was tested for COVID-19, Influenza and/or RSV, then the patient/parent/guardian was advised to isolate at home pending the results of his/her diagnostic coronavirus test and  potentially longer if theyre positive. I have also advised pt that if his/her COVID-19 test returns positive, it's recommended to self-isolate for at least 10 days after symptoms first appeared AND until fever-free for 24 hours without fever reducer AND other symptoms have improved or resolved. Discussed self-isolation recommendations as well as instructions for household member/close contacts as per the [facility] and [place] DHHS, and also gave patient the [facility] packet with this information.  Patient/parent/caregiver has been advised to return to the [facility] or PCP in 3-5 days if no better; to PCP or the Emergency Department if new signs and symptoms develop, or if the current signs or symptoms continue to change or worsen for further workup, evaluation and treatment as clinically indicated and appropriate  The patient will follow up with their current PCP  if and as advised. If the patient does not currently have a PCP we will assist them in obtaining one.   The patient may need specialty follow up if the symptoms continue, in spite of conservative treatment and management, for further workup, evaluation, consultation and treatment as clinically indicated and appropriate.  Patient/parent/caregiver verbalized understanding and agreement of plan as discussed.  All questions were addressed during visit.  Please see discharge instructions below for further details of plan.  Discharge Instructions:   Discharge Instructions      Please begin methylprednisolone, 1 row of tablets daily for your lower back pain that radiates down your leg.    Please begin Zyrtec 5 mg 1 tablet every night at bedtime for your allergy symptoms and voice hoarseness.  Please also be sure that you are using Nasonex nasal spray every night at bedtime.  Please follow-up with your primary care physician to discuss these 2 issues, your progress and to see if there is any further treatment that would be of benefit.    This office note has  been dictated using Museum/gallery curator.  Unfortunately, and despite my best efforts, this method of dictation can sometimes lead to occasional typographical or grammatical errors.  I apologize in advance if this occurs.     Lynden Oxford Scales, PA-C 07/12/21 1421

## 2021-07-12 NOTE — Discharge Instructions (Addendum)
Please begin methylprednisolone, 1 row of tablets daily for your lower back pain that radiates down your leg.    Please begin Zyrtec 5 mg 1 tablet every night at bedtime for your allergy symptoms and voice hoarseness.  Please also be sure that you are using Nasonex nasal spray every night at bedtime.  Please follow-up with your primary care physician to discuss these 2 issues, your progress and to see if there is any further treatment that would be of benefit.

## 2021-07-28 ENCOUNTER — Emergency Department (HOSPITAL_BASED_OUTPATIENT_CLINIC_OR_DEPARTMENT_OTHER)
Admit: 2021-07-28 | Discharge: 2021-07-28 | Disposition: A | Discharge status: Home / Self Care | Payer: No Typology Code available for payment source

## 2021-07-28 ENCOUNTER — Emergency Department (HOSPITAL_COMMUNITY)
Admission: EM | Admit: 2021-07-28 | Discharge: 2021-07-28 | Disposition: A | Discharge status: Home / Self Care | Payer: No Typology Code available for payment source | Attending: Emergency Medicine | Admitting: Emergency Medicine

## 2021-07-28 ENCOUNTER — Encounter (HOSPITAL_COMMUNITY): Payer: Self-pay | Admitting: Emergency Medicine

## 2021-07-28 DIAGNOSIS — M79605 Pain in left leg: Secondary | ICD-10-CM | POA: Diagnosis not present

## 2021-07-28 DIAGNOSIS — J45909 Unspecified asthma, uncomplicated: Secondary | ICD-10-CM | POA: Insufficient documentation

## 2021-07-28 DIAGNOSIS — J029 Acute pharyngitis, unspecified: Secondary | ICD-10-CM | POA: Insufficient documentation

## 2021-07-28 DIAGNOSIS — Z79899 Other long term (current) drug therapy: Secondary | ICD-10-CM | POA: Insufficient documentation

## 2021-07-28 DIAGNOSIS — I10 Essential (primary) hypertension: Secondary | ICD-10-CM | POA: Insufficient documentation

## 2021-07-28 DIAGNOSIS — M7989 Other specified soft tissue disorders: Secondary | ICD-10-CM | POA: Diagnosis not present

## 2021-07-28 DIAGNOSIS — M549 Dorsalgia, unspecified: Secondary | ICD-10-CM | POA: Diagnosis not present

## 2021-07-28 LAB — CBC WITH DIFFERENTIAL/PLATELET
Abs Immature Granulocytes: 0.01 10*3/uL (ref 0.00–0.07)
Basophils Absolute: 0 10*3/uL (ref 0.0–0.1)
Basophils Relative: 0 %
Eosinophils Absolute: 0 10*3/uL (ref 0.0–0.5)
Eosinophils Relative: 1 %
HCT: 34.6 % — ABNORMAL LOW (ref 39.0–52.0)
Hemoglobin: 10.9 g/dL — ABNORMAL LOW (ref 13.0–17.0)
Immature Granulocytes: 0 %
Lymphocytes Relative: 17 %
Lymphs Abs: 0.9 10*3/uL (ref 0.7–4.0)
MCH: 28.6 pg (ref 26.0–34.0)
MCHC: 31.5 g/dL (ref 30.0–36.0)
MCV: 90.8 fL (ref 80.0–100.0)
Monocytes Absolute: 0.6 10*3/uL (ref 0.1–1.0)
Monocytes Relative: 11 %
Neutro Abs: 3.9 10*3/uL (ref 1.7–7.7)
Neutrophils Relative %: 71 %
Platelets: 152 10*3/uL (ref 150–400)
RBC: 3.81 MIL/uL — ABNORMAL LOW (ref 4.22–5.81)
RDW: 13.9 % (ref 11.5–15.5)
WBC: 5.5 10*3/uL (ref 4.0–10.5)
nRBC: 0 % (ref 0.0–0.2)

## 2021-07-28 LAB — BASIC METABOLIC PANEL
Anion gap: 5 (ref 5–15)
BUN: 18 mg/dL (ref 8–23)
CO2: 28 mmol/L (ref 22–32)
Calcium: 8.3 mg/dL — ABNORMAL LOW (ref 8.9–10.3)
Chloride: 106 mmol/L (ref 98–111)
Creatinine, Ser: 1.68 mg/dL — ABNORMAL HIGH (ref 0.61–1.24)
GFR, Estimated: 38 mL/min — ABNORMAL LOW (ref >60)
Glucose, Bld: 92 mg/dL (ref 70–99)
Potassium: 4 mmol/L (ref 3.5–5.1)
Sodium: 139 mmol/L (ref 135–145)

## 2021-07-28 NOTE — ED Provider Notes (Signed)
[facility] DEPT Provider Note   CSN: [id number] Arrival date & time: 07/28/21  1240     History  Chief Complaint  Patient presents with   Leg Pain   Sore Throat   Back Pain     Christopher Shepherd is a 88 y.o. male with history of hypertension, asthma and hyperlipidemia who presents to the ED for evaluation of left lower leg and swelling.  Patient states that he has been worked up about 2 or 3 times in the last 6 to 8 months for potential blood clot due to this pain.  Initially, his pain started left lateral thigh and is now progressed down to the left posterior lateral calf.  Pain is described as burning.  He does have occasional swelling, although this resolved when he lies on his back and his legs up against the wall for about 30 minutes.  He is here again today concern for blood clot.  He has no previous history of this and is not currently on any blood thinners.  He denies numbness, tingling, chest pain, shortness of breath or cough.    Leg Pain Associated symptoms: no fever   Sore Throat Pertinent negatives include no abdominal pain, no headaches and no shortness of breath.  Back Pain Associated symptoms: leg pain   Associated symptoms: no abdominal pain, no fever and no headaches       Home Medications Prior to Admission medications   Medication Sig Start Date End Date Taking? Authorizing Provider  acetaminophen (TYLENOL) 650 MG CR tablet Take 650 mg by mouth every 8 (eight) hours as needed for pain.    [provider]  amLODipine (NORVASC) 10 MG tablet Take 10 mg by mouth daily.    [provider]  cetirizine (ZYRTEC) 5 MG tablet Take 1 tablet (5 mg total) by mouth at bedtime. 07/12/21 09/10/21  Lynden Oxford Scales, PA-C  chlorhexidine (PERIDEX) 0.12 % solution chlorhexidine gluconate 0.12 % mouthwash    [provider]  Cholecalciferol (VITAMIN D-3) 1000 units CAPS Take 1 capsule by mouth daily.     [provider]  Coenzyme Q10 (CO Q 10 PO) Co Q-10 300 mg capsule  Take 1 capsule every day by oral route.    [provider]  furosemide (LASIX) 20 MG tablet Take 1 tablet by mouth daily. 08/23/20   [provider]  hydrALAZINE (APRESOLINE) 25 MG tablet Take 50 mg by mouth 2 (two) times daily. 03/10/20   [provider]  losartan (COZAAR) 100 MG tablet Take 100 mg by mouth every morning.    [provider]  methylPREDNISolone (MEDROL DOSEPAK) 4 MG TBPK tablet Take 24 mg on day 1, 20 mg on day 2, 16 mg on day 3, 12 mg on day 4, 8 mg on day 5, 4 mg on day 6. 07/12/21   Lynden Oxford Scales, PA-C  metoprolol succinate (TOPROL-XL) 25 MG 24 hr tablet Take 12.5 mg by mouth daily.     [provider]  mometasone (NASONEX) 50 MCG/ACT nasal spray 1-2 sprays each nostril daily at bedtime while needed Patient taking differently: Place 1-2 sprays into the nose at bedtime as needed (allergies). 08/12/16   Baird Lyons D, MD  Omega 3 1000 MG CAPS Take 1 capsule by mouth.     [provider]  rosuvastatin (CRESTOR) 20 MG tablet Take 10 mg by mouth every evening.     [provider]  vitamin B-12 (CYANOCOBALAMIN) 100 MCG tablet Take  100 mcg by mouth daily.    [provider]      Allergies    Fosinopril    Review of Systems   Review of Systems  Constitutional:  Negative for fever.  HENT: Negative.    Eyes: Negative.   Respiratory:  Negative for shortness of breath.   Cardiovascular: Negative.   Gastrointestinal:  Negative for abdominal pain and vomiting.  Endocrine: Negative.   Genitourinary: Negative.   Musculoskeletal:  Positive for myalgias.  Skin:  Negative for rash.  Neurological:  Negative for headaches.  All other systems reviewed and are negative.  Physical Exam Updated Vital Signs BP (!) 125/56 (BP Location: Left Arm)    Pulse (!) 59    Temp 98.1 F (36.7 C) (Oral)    Resp 16    Ht 5\' 6"  (1.676 m)    Wt 77.1 kg     SpO2 98%    BMI 27.44 kg/m  Physical Exam Vitals and nursing note reviewed.  Constitutional:      General: He is not in acute distress.    Appearance: He is not ill-appearing.  HENT:     Head: Atraumatic.  Eyes:     Conjunctiva/sclera: Conjunctivae normal.  Cardiovascular:     Rate and Rhythm: Normal rate and regular rhythm.     Pulses: Normal pulses.     Heart sounds: No murmur heard. Pulmonary:     Effort: Pulmonary effort is normal. No respiratory distress.     Breath sounds: Normal breath sounds.  Abdominal:     General: Abdomen is flat. There is no distension.     Palpations: Abdomen is soft.     Tenderness: There is no abdominal tenderness.  Musculoskeletal:        General: Normal range of motion.     Cervical back: Normal range of motion.     Comments: Bilateral lower extremities without edema.  2+ DP pulses.  Left leg is without acute injury or palpable deformities.  No erythema, induration or fluctuance indicative of infection.  Overall, leg is unimpressive on evaluation.  ROM of knee and hip intact bilaterally.  Skin:    General: Skin is warm and dry.     Capillary Refill: Capillary refill takes less than 2 seconds.  Neurological:     General: No focal deficit present.     Mental Status: He is alert.  Psychiatric:        Mood and Affect: Mood normal.    ED Results / Procedures / Treatments   Labs (all labs ordered are listed, but only abnormal results are displayed) Labs Reviewed  CBC WITH DIFFERENTIAL/PLATELET - Abnormal; Notable for the following components:      Result Value   RBC 3.81 (*)    Hemoglobin 10.9 (*)    HCT 34.6 (*)    All other components within normal limits  BASIC METABOLIC PANEL - Abnormal; Notable for the following components:   Creatinine, Ser 1.68 (*)    Calcium 8.3 (*)    GFR, Estimated 38 (*)    All other components within normal limits    EKG None  Radiology VAS [place] LOWER EXTREMITY VENOUS (DVT) (7a-7p)  Result Date:  07/28/2021  Lower Venous DVT Study Patient Name:  Christopher Shepherd  Date of Exam:   07/28/2021 Medical Rec #: [id number]         Accession #:    [id number] Date of Birth: July 25, 1926          Patient Gender:  M Patient Age:   64 years Exam Location:  [facility] Procedure:      VAS [place] LOWER EXTREMITY VENOUS (DVT) Referring Phys: Markanthony Gedney --------------------------------------------------------------------------------  Indications: Swelling.  Risk Factors: None identified. Comparison Study: 03/21/2021, 03/28/2021, 05/26/2021 - Negative for DVT Performing Technologist: Oliver Hum RVT  Examination Guidelines: A complete evaluation includes B-mode imaging, spectral Doppler, color Doppler, and power Doppler as needed of all accessible portions of each vessel. Bilateral testing is considered an integral part of a complete examination. Limited examinations for reoccurring indications may be performed as noted. The reflux portion of the exam is performed with the patient in reverse Trendelenburg.  +-----+---------------+---------+-----------+----------+--------------+  RIGHT Compressibility Phasicity Spontaneity Properties Thrombus Aging  +-----+---------------+---------+-----------+----------+--------------+  CFV   Full            Yes       Yes                                    +-----+---------------+---------+-----------+----------+--------------+   +---------+---------------+---------+-----------+----------+--------------+  LEFT      Compressibility Phasicity Spontaneity Properties Thrombus Aging  +---------+---------------+---------+-----------+----------+--------------+  CFV       Full            Yes       Yes                                    +---------+---------------+---------+-----------+----------+--------------+  SFJ       Full                                                             +---------+---------------+---------+-----------+----------+--------------+  FV Prox   Full                                                              +---------+---------------+---------+-----------+----------+--------------+  FV Mid    Full                                                             +---------+---------------+---------+-----------+----------+--------------+  FV Distal Full                                                             +---------+---------------+---------+-----------+----------+--------------+  PFV       Full                                                             +---------+---------------+---------+-----------+----------+--------------+  POP       Full            Yes       Yes                                    +---------+---------------+---------+-----------+----------+--------------+  PTV       Full                                                             +---------+---------------+---------+-----------+----------+--------------+  PERO      Full                                                             +---------+---------------+---------+-----------+----------+--------------+     Summary: RIGHT: - No evidence of common femoral vein obstruction. - No cystic structure found in the popliteal fossa.  LEFT: - There is no evidence of deep vein thrombosis in the lower extremity.  - No cystic structure found in the popliteal fossa.  *See table(s) above for measurements and observations.    Preliminary     Procedures Procedures    Medications Ordered in ED Medications - No data to display  ED Course/ Medical Decision Making/ A&P                           Medical Decision Making Amount and/or Complexity of Data Reviewed Labs: ordered. ECG/medicine tests: ordered.   History:  Christopher Shepherd is a 88 y.o. male with history of hypertension, asthma and hyperlipidemia who presents to the ED for evaluation of left lower leg and swelling.  Patient states that he has been worked up about 2 or 3 times in the last 6 to 8 months for potential blood clot due to this pain.  Initially,  his pain started left lateral thigh and is now progressed down to the left posterior lateral calf.  Pain is described as burning.  He does have occasional swelling, although this resolved when he lies on his back and his legs up against the wall for about 30 minutes.  He is here again today concern for blood clot.  He has no previous history of this and is not currently on any blood thinners.  He denies numbness, tingling, chest pain, shortness of breath or cough. External records from outside source obtained and reviewed including previous venous ultrasounds Details: Patient had negative DVT on ultrasound in November and twice in September 2022. This patient presents to the ED for concern of left leg pain, this involves an extensive number of treatment options, and is a complaint that carries with it a high risk of complications and morbidity.   Differential diagnosis of this complaint includes DVT, Baker's cyst, traumatic injury, cellulitis, arterial occlusion, muscular strain  Lab Tests and EKG:  I Ordered, reviewed, and interpreted labs and EKG.  The pertinent results include: CBC without leukocytosis BMP unremarkable, at patient's baseline.  Imaging Studies ordered:  I ordered imaging studies including venous ultrasound of the left leg  that was negative for DVT or cyst I independently visualized and interpreted imaging and I agree with the radiologist interpretation. Decisions made regarding results are detailed in the ED course and/or initial impression section above.   Disposition:  After consideration of the diagnostic results, physical exam, history and the patients response to treatment feel that the patent would benefit from discharge with orthopedic follow-up.   Left leg pain: Patient is negative for DVT, and physical exam is turning for cellulitis, trauma, edema or arterial occlusion.  All labs and imaging were discussed with the patient.  This is likely orthopedic in nature, and I  have provided patient referral for Ortho follow-up.  We discussed signs and symptoms that warrant return to the ED.  Patient is understanding and amenable to plan.  Discharged in good condition.   Final Clinical Impression(s) / ED Diagnoses Final diagnoses:  Left leg pain    Rx / DC Orders ED Discharge Orders     None         [name], PA-C 07/28/21 [place], [place], DO 07/29/21 [id number]

## 2021-07-28 NOTE — Progress Notes (Signed)
Left lower extremity venous duplex has been completed. Preliminary results can be found in CV Proc through chart review.  Results were given to [facility] PA.  07/28/21 2:44 PM Carlos Levering RVT

## 2021-07-28 NOTE — ED Triage Notes (Signed)
Reports L lower leg pain x2-3 months, calf tenderness w/ ambulation, was previously having L hip pain. He also reports he is hoarse and has back pian. Has had steroids in the past, did not help.

## 2021-07-28 NOTE — Discharge Instructions (Addendum)
Your work-up today was reassuring and your ultrasound was negative for blood clot.  Since this is the third time that its been worked up over the last several months, I think we can safely rule out a blood clot pain the cause of your left leg swelling and pain.  Your leg also does not look infected or have an obvious injury.  I have sent you in a referral for an orthopedic doctor you can schedule an appointment with may be able to do further testing or evaluation to help with pain.  Continue to elevate the leg when you are at rest and use Tylenol or Motrin as needed to help with your pain.  Return if you develop sudden numbness or tingling of the leg, chest pain or shortness of breath.

## 2021-07-28 NOTE — ED Provider Triage Note (Signed)
Emergency Medicine Provider Triage Evaluation Note  Christopher Shepherd , a 88 y.o. male  was evaluated in triage.  Pt complains of left lower extremity edema and pain that has been ongoing for about 3 months but seems to have worsened in the last day or so.  Patient states that he was able to improve some of the edema by putting his feet up on a wall for about 30 minutes and helping to train.  However the pain in the posterior calf will not improve.  Pain is worsened with walking or on palpation.  Patient is concerned about a blood clot and is here for evaluation of that.  He denies chest pain, shortness of breath, cough, injury or trauma.  Patient is not on anticoagulation.  Review of Systems  Positive: Left lower calf pain, swelling Negative: Chest pain, shortness of breath, cough  Physical Exam  BP (!) 125/56 (BP Location: Left Arm)    Pulse (!) 59    Temp 98.1 F (36.7 C) (Oral)    Resp 16    Ht 5\' 6"  (1.676 m)    Wt 77.1 kg    SpO2 98%    BMI 27.44 kg/m  Gen:   Awake, no distress   Resp:  Normal effort  MSK:   Moves extremities without difficulty, TTP of the posterior lateral left calf, without edema.  2+ DP pulses bilaterally. Other:    Medical Decision Making  Medically screening exam initiated at 1:12 PM.  Appropriate orders placed.  Christopher Shepherd was informed that the remainder of the evaluation will be completed by another provider, this initial triage assessment does not replace that evaluation, and the importance of remaining in the ED until their evaluation is complete.     Christopher Shepherd, [place] 07/28/21 1314

## 2021-08-08 ENCOUNTER — Ambulatory Visit (INDEPENDENT_AMBULATORY_CARE_PROVIDER_SITE_OTHER): Payer: Medicare Other | Admitting: Orthopaedic Surgery

## 2021-08-08 ENCOUNTER — Ambulatory Visit (INDEPENDENT_AMBULATORY_CARE_PROVIDER_SITE_OTHER): Payer: Medicare Other

## 2021-08-08 ENCOUNTER — Encounter: Payer: Self-pay | Admitting: Orthopaedic Surgery

## 2021-08-08 ENCOUNTER — Other Ambulatory Visit: Payer: Self-pay

## 2021-08-08 VITALS — Ht 66.0 in | Wt 170.0 lb

## 2021-08-08 DIAGNOSIS — G8929 Other chronic pain: Secondary | ICD-10-CM | POA: Diagnosis not present

## 2021-08-08 DIAGNOSIS — M545 Low back pain, unspecified: Secondary | ICD-10-CM

## 2021-08-08 MED ORDER — GABAPENTIN 300 MG PO CAPS: 300.0000 mg | ORAL_CAPSULE | Freq: Every day | ORAL | 3 refills | Status: DC

## 2021-08-08 NOTE — Progress Notes (Signed)
The patient is a 88 year old gentleman I am seeing for the first time as it relates to low back pain and left lower extremity radicular symptoms.  He is not a diabetic and not on blood thinning medications.  He denies any injuries and this is been going on for about 3 to 4 months now with a burning pain in his left thigh and left lower leg.  He says when his posture is better it does feel better but when he bends to lean over to wash dishes or holds a grocery cart in the store to push around that he gets this pain.  He denies any change in bowel or bladder function.  He did have 2 rounds of oral steroids and that gave him minimal relief.  I did help him some at night.  He does have a hard time getting restful sleep at night.  His wife is with him.  He does ambulate using a cane.  I was able to review his medical records in epic including his medications.  He currently denies any headache or chest pain.  Denies any fever, chills, nausea, vomiting.  On exam he does have pain with flexion extension of the lumbar spine.  Both hips and knees move normally.  He has 5 out of 5 strength in the muscle groups of both his lower extremities and negative straight leg raise.  There is subjective numbness in the side of his leg on the left side and into his foot.  Plain films of the lumbar spine do show some degenerative changes in the lower lumbar spine.  There is likely some foraminal stenosis at L5-S1.  He would like to try just medical treatment for now so we will start him on Neurontin 300 mg just at bedtime.  He will work on his posture as well.  I would like to see him back in 3 weeks to see how he is done with the Neurontin.  I talked him about the possibility of an MRI of the lumbar spine and considering an epidural steroid injection but he wants to just try medication first which I think is reasonable.  All questions and concerns were answered and addressed.  No x-rays are needed at his next visit.

## 2021-08-29 ENCOUNTER — Ambulatory Visit: Payer: No Typology Code available for payment source | Admitting: Orthopaedic Surgery

## 2021-09-03 ENCOUNTER — Encounter (HOSPITAL_COMMUNITY): Payer: Self-pay

## 2021-09-03 ENCOUNTER — Emergency Department (HOSPITAL_COMMUNITY)
Admission: EM | Admit: 2021-09-03 | Discharge: 2021-09-03 | Disposition: A | Discharge status: Home / Self Care | Payer: Medicare Other | Attending: Emergency Medicine | Admitting: Emergency Medicine

## 2021-09-03 ENCOUNTER — Emergency Department (HOSPITAL_COMMUNITY): Payer: Medicare Other

## 2021-09-03 ENCOUNTER — Other Ambulatory Visit: Payer: Self-pay

## 2021-09-03 ENCOUNTER — Emergency Department (HOSPITAL_BASED_OUTPATIENT_CLINIC_OR_DEPARTMENT_OTHER): Payer: Medicare Other

## 2021-09-03 DIAGNOSIS — Z20822 Contact with and (suspected) exposure to covid-19: Secondary | ICD-10-CM | POA: Diagnosis not present

## 2021-09-03 DIAGNOSIS — M545 Low back pain, unspecified: Secondary | ICD-10-CM | POA: Insufficient documentation

## 2021-09-03 DIAGNOSIS — Z8616 Personal history of COVID-19: Secondary | ICD-10-CM | POA: Insufficient documentation

## 2021-09-03 DIAGNOSIS — H9312 Tinnitus, left ear: Secondary | ICD-10-CM | POA: Diagnosis not present

## 2021-09-03 DIAGNOSIS — M7989 Other specified soft tissue disorders: Secondary | ICD-10-CM | POA: Diagnosis not present

## 2021-09-03 LAB — RESP PANEL BY RT-PCR (FLU A&B, COVID) ARPGX2
Influenza A by PCR: NEGATIVE
Influenza B by PCR: NEGATIVE
SARS Coronavirus 2 by RT PCR: NEGATIVE

## 2021-09-03 MED ORDER — LIDOCAINE 5 % EX PTCH: 1.0000 | MEDICATED_PATCH | CUTANEOUS | 0 refills | Status: AC

## 2021-09-03 NOTE — ED Provider Notes (Signed)
[facility] DEPT Provider Note   CSN: [id number] Arrival date & time: 09/03/21  1534     History  Chief Complaint  Patient presents with   Back Pain    Christopher Shepherd is a 88 y.o. male.  Patient is a 88 year old male who presents with multiple complaints.  He does complain of some low back pain.  He said he has had it for several months.  He does not report any radicular symptoms but he does have some pain which he describes as a burning pain in his left thigh and left calf area.  No loss of bowel or bladder control.  No numbness or weakness to his extremities.  He says it does not hurt all the time but when he is doing stuff like bending over to wash dishes or lift things, it hurts.  He has seen Dr. Ninfa Linden with orthopedic surgery on February 8.  At that time it was decided to try medication management first.  He was started on Neurontin but patient read the side effects and decided not to take it.  There was some discussion of doing an MRI but the patient had wanted to do medication management first.  He does not report any worsening of the symptoms but they just have not gotten better.  He said he has taken some steroids in the past that have made it better for short period of time.  He also has some swelling of his legs.  They seem symmetric and he does wear compression stockings but he does have a burning type pain in his left calf area which has been going on for several months as well.  He is concerned about a possible blood clot.  No chest pain or shortness of breath.    He also states he had COVID around Labor Day and since that time he has had some intermittent hoarseness and ringing in his ear.  It will come and go.  He was requesting another COVID test.  He denies any cough or chest congestion.  No fevers.  No nasal congestion.  No difficulty swallowing.        Home Medications Prior to Admission medications   Medication Sig Start Date End Date  Taking? Authorizing Provider  lidocaine (LIDODERM) 5 % Place 1 patch onto the skin daily. Remove & Discard patch within 12 hours or as directed by MD 09/03/21  Yes Malvin Johns, MD  acetaminophen (TYLENOL) 650 MG CR tablet Take 650 mg by mouth every 8 (eight) hours as needed for pain.    [provider]  amLODipine (NORVASC) 10 MG tablet Take 10 mg by mouth daily.    [provider]  cetirizine (ZYRTEC) 5 MG tablet Take 1 tablet (5 mg total) by mouth at bedtime. 07/12/21 09/10/21  Lynden Oxford Scales, PA-C  chlorhexidine (PERIDEX) 0.12 % solution chlorhexidine gluconate 0.12 % mouthwash    [provider]  Cholecalciferol (VITAMIN D-3) 1000 units CAPS Take 1 capsule by mouth daily.     [provider]  Coenzyme Q10 (CO Q 10 PO) Co Q-10 300 mg capsule  Take 1 capsule every day by oral route.    [provider]  furosemide (LASIX) 20 MG tablet Take 1 tablet by mouth daily. 08/23/20   [provider]  gabapentin (NEURONTIN) 300 MG capsule Take 1 capsule (300 mg total) by mouth at bedtime. 08/08/21   Mcarthur Rossetti, MD  hydrALAZINE (APRESOLINE) 25 MG tablet Take 50 mg by  mouth 2 (two) times daily. 03/10/20   [provider]  losartan (COZAAR) 100 MG tablet Take 100 mg by mouth every morning.    [provider]  methylPREDNISolone (MEDROL DOSEPAK) 4 MG TBPK tablet Take 24 mg on day 1, 20 mg on day 2, 16 mg on day 3, 12 mg on day 4, 8 mg on day 5, 4 mg on day 6. Patient not taking: Reported on 08/08/2021 07/12/21   Lynden Oxford Scales, PA-C  metoprolol succinate (TOPROL-XL) 25 MG 24 hr tablet Take 12.5 mg by mouth daily.     [provider]  mometasone (NASONEX) 50 MCG/ACT nasal spray 1-2 sprays each nostril daily at bedtime while needed Patient taking differently: Place 1-2 sprays into the nose at bedtime as needed (allergies). 08/12/16   Baird Lyons D, MD  Omega 3 1000 MG CAPS Take 1 capsule by mouth.     [provider]  rosuvastatin (CRESTOR) 20 MG tablet Take 10 mg by mouth every evening.     [provider]  vitamin B-12 (CYANOCOBALAMIN) 100 MCG tablet Take 100 mcg by mouth daily.    [provider]      Allergies    Fosinopril    Review of Systems   Review of Systems  Constitutional:  Negative for chills, diaphoresis, fatigue and fever.  HENT:  Negative for congestion, rhinorrhea and sneezing.        Intermittent hoarseness  Eyes: Negative.   Respiratory:  Negative for cough, chest tightness and shortness of breath.   Cardiovascular:  Positive for leg swelling. Negative for chest pain.  Gastrointestinal:  Negative for abdominal pain, blood in stool, diarrhea, nausea and vomiting.  Genitourinary:  Negative for difficulty urinating, flank pain, frequency and hematuria.  Musculoskeletal:  Positive for back pain. Negative for arthralgias.  Skin:  Negative for rash.  Neurological:  Negative for dizziness, speech difficulty, weakness, numbness and headaches.   Physical Exam Updated Vital Signs BP (!) 144/61 (BP Location: Left Arm)    Pulse 68    Temp 98.3 F (36.8 C) (Oral)    Resp 18    Ht 5\' 6"  (1.676 m)    Wt 76.7 kg    SpO2 97%    BMI 27.28 kg/m  Physical Exam Constitutional:      Appearance: He is well-developed.  HENT:     Head: Normocephalic and atraumatic.     Nose: Nose normal.     Mouth/Throat:     Mouth: Mucous membranes are moist.     Pharynx: No oropharyngeal exudate or posterior oropharyngeal erythema.  Eyes:     Pupils: Pupils are equal, round, and reactive to light.  Cardiovascular:     Rate and Rhythm: Normal rate and regular rhythm.     Heart sounds: Normal heart sounds.  Pulmonary:     Effort: Pulmonary effort is normal. No respiratory distress.     Breath sounds: Normal breath sounds. No wheezing or rales.  Chest:     Chest wall: No tenderness.  Abdominal:     General: Bowel sounds are normal.     Palpations: Abdomen is soft.      Tenderness: There is no abdominal tenderness. There is no guarding or rebound.  Musculoskeletal:        General: Normal range of motion.     Cervical back: Normal range of motion and neck supple.     Comments: Mild TTP mid lower lumbar spine.  Neg SLR, motor/sensory intact to lower  ext bilaterally, pedal pulses present.  Mild edema to lower extremities bilaterally,  Lymphadenopathy:     Cervical: No cervical adenopathy.  Skin:    General: Skin is warm and dry.     Findings: No rash.  Neurological:     Mental Status: He is alert and oriented to person, place, and time.    ED Results / Procedures / Treatments   Labs (all labs ordered are listed, but only abnormal results are displayed) Labs Reviewed  RESP PANEL BY RT-PCR (FLU A&B, COVID) [id number]    EKG None  Radiology DG Lumbar Spine Complete  Result Date: 09/03/2021 CLINICAL DATA:  88 year old with back pain. Recurrent low back pain. EXAM: LUMBAR SPINE - COMPLETE 4+ VIEW COMPARISON:  Lumbar radiograph 1 month ago 08/08/2021 FINDINGS: There are 5 lumbar type vertebra. Stable alignment with slight straightening of normal lordosis. Trace grade 1 anterolisthesis of L4 on L5. No fracture or compression deformity. Diffuse degenerative disc disease with disc space narrowing, endplate spurring, and vacuum phenomenon. Prominent lower lumbar facet hypertrophy. No evidence of focal bone lesion or bony destruction. The sacroiliac joints are congruent. Aortic atherosclerosis. IMPRESSION: 1. Multilevel degenerative disc disease and facet hypertrophy, unchanged from radiographs last month. 2. No acute radiographic findings. Electronically Signed   By: Keith Rake M.D.   On: 09/03/2021 17:26   VAS [place] LOWER EXTREMITY VENOUS (DVT) (7a-7p)  Result Date: 09/03/2021  Lower Venous DVT Study Patient Name:  Christopher Shepherd  Date of Exam:   09/03/2021 Medical Rec #: [id number]         Accession #:    [id number] Date of Birth: 04-Feb-1927          Patient Gender: M  Patient Age:   71 years Exam Location:  [facility] Procedure:      VAS [place] LOWER EXTREMITY VENOUS (DVT) Referring Phys: Domenic Moras --------------------------------------------------------------------------------  Indications: Swelling.  Comparison Study: Previous exams on 07/28/21, 05/26/21, 03/28/21, 03/21/21, and                   10/30/16 were ALL negative for DVT. Performing Technologist: Rogelia Rohrer RVT, RDMS  Examination Guidelines: A complete evaluation includes B-mode imaging, spectral Doppler, color Doppler, and power Doppler as needed of all accessible portions of each vessel. Bilateral testing is considered an integral part of a complete examination. Limited examinations for reoccurring indications may be performed as noted. The reflux portion of the exam is performed with the patient in reverse Trendelenburg.  +-----+---------------+---------+-----------+----------+--------------+  RIGHT Compressibility Phasicity Spontaneity Properties Thrombus Aging  +-----+---------------+---------+-----------+----------+--------------+  CFV   Full            Yes       Yes                                    +-----+---------------+---------+-----------+----------+--------------+   +---------+---------------+---------+-----------+----------+--------------+  LEFT      Compressibility Phasicity Spontaneity Properties Thrombus Aging  +---------+---------------+---------+-----------+----------+--------------+  CFV       Full            Yes       Yes                                    +---------+---------------+---------+-----------+----------+--------------+  SFJ       Full                                                             +---------+---------------+---------+-----------+----------+--------------+  FV Prox   Full            Yes       Yes                                    +---------+---------------+---------+-----------+----------+--------------+  FV Mid    Full            Yes       Yes                                     +---------+---------------+---------+-----------+----------+--------------+  FV Distal Full            Yes       Yes                                    +---------+---------------+---------+-----------+----------+--------------+  PFV       Full                                                             +---------+---------------+---------+-----------+----------+--------------+  POP       Full            Yes       Yes                                    +---------+---------------+---------+-----------+----------+--------------+  PTV       Full                                                             +---------+---------------+---------+-----------+----------+--------------+  PERO      Full                                                             +---------+---------------+---------+-----------+----------+--------------+    *See table(s) above for measurements and observations. Electronically signed by Servando Snare MD on 09/03/2021 at 5:10:42 PM.    Final     Procedures Procedures    Medications Ordered in ED Medications - No data to display  ED Course/ Medical Decision Making/ A&P                           Medical Decision Making Amount and/or Complexity of Data Reviewed External Data Reviewed: notes.    Details: Dr. Trevor Mace notes Radiology: ordered and independent interpretation performed.  Risk Prescription drug management.   Patient presents with multiple complaints.  He has back pain which seems to be ongoing for several months.  He did not want to start the Neurontin that was prescribed to him.  We discussed  options.  He does not have any neurologic deficits or signs of cauda equina.  He had imaging today which showed degenerative changes.  This was interpreted by me.  It appears similar to his prior x-rays.  He does not have symptoms that would warrant MRI emergently tonight.  No fevers.  Discussed treatment options.  We will start Lidoderm patches and he will also use  Voltaren gel to the area.  He will follow-up with Dr. Ninfa Linden to discuss possible MRI imaging.  His COVID test was negative.  He currently does not have any URI symptoms.  His lungs are clear.  No hypoxia.  He has some mild swelling of the lower extremities bilaterally.  He had Doppler ultrasound of his left leg which shows no evidence of DVT.  There is no evidence of cellulitis.  He has good perfusion.  Good pulses.  Was advised to continue using his compression stockings and follow-up with his PCP regarding this.  He was discharged home in good condition.  Return precautions were given.  Final Clinical Impression(s) / ED Diagnoses Final diagnoses:  Acute midline low back pain without sciatica  Leg swelling    Rx / DC Orders ED Discharge Orders          Ordered    lidocaine (LIDODERM) 5 %  Every 24 hours        09/03/21 1928              Malvin Johns, MD 09/03/21 [phone]

## 2021-09-03 NOTE — Discharge Instructions (Signed)
Use of lidocaine patches and Voltaren gel as needed for symptomatic relief.  Follow-up with your primary care doctor regarding the leg swelling.  Follow-up with Dr. Ninfa Linden regarding your back pain.  Return to the emergency room if you have any worsening symptoms. ?

## 2021-09-03 NOTE — ED Triage Notes (Signed)
Patient c/o lower back pain x 3 weeks. ? ?Patient c/o left calf pain x 2 months and bilateral foot swelling that he says happens frequently.  Patient reports a history of DVT's ?

## 2021-09-03 NOTE — Progress Notes (Signed)
LLE venous duplex has been completed.  Preliminary results given to Domenic Moras, PA-C via secure chat. ? ?Results can be found under chart review under CV PROC. ?09/03/2021 5:06 PM ?Christopher Shepherd RVT, RDMS ? ?

## 2021-09-03 NOTE — ED Notes (Signed)
Pt NAD, c/o multiple complaints, including "hoarseness" of voice, intermittent feet swelling x 6 months, and back pain x 3 weeks. Speaking in full and complete sentences. LS dim. Denies orthopnea, dyspnea, CP. Has +2 edema to feet ?

## 2021-09-03 NOTE — ED Provider Triage Note (Signed)
Emergency Medicine Provider Triage Evaluation Note ? ?Christopher Shepherd , a 88 y.o. male  was evaluated in triage.  Pt complains of multiple complaints.  Pt report having recurrent lower back pain, pain to L leg with swelling to leg x several weeks.  Also report ringing in ears and hoarseness similar to prior covid infection and request covid test ? ?Review of Systems  ?Positive: Back pain, L leg pain, swelling, ringing in ears, hoarseness ?Negative: Fever, cough, sob, injury ? ?Physical Exam  ?BP (!) 144/61 (BP Location: Left Arm)   Pulse 68   Temp 98.3 ?F (36.8 ?C) (Oral)   Resp 18   Ht 5\' 6"  (1.676 m)   Wt 76.7 kg   SpO2 97%   BMI 27.28 kg/m?  ?Gen:   Awake, no distress   ?Resp:  Normal effort  ?MSK:   Moves extremities without difficulty  ?Other:   ? ?Medical Decision Making  ?Medically screening exam initiated at 4:28 PM.  Appropriate orders placed.  Christopher Shepherd was informed that the remainder of the evaluation will be completed by another provider, this initial triage assessment does not replace that evaluation, and the importance of remaining in the ED until their evaluation is complete. ? ?Multiple complaints ?  ?Domenic Moras, PA-C ?09/03/21 1630 ? ?

## 2021-09-03 NOTE — ED Notes (Signed)
Pt NAD, a/ox4. Pt verbalizes understanding of all DC and f/u instructions. All questions answered. Pt walks with steady gait to lobby at DC.  ? ?

## 2021-09-05 ENCOUNTER — Telehealth: Payer: Self-pay | Admitting: Orthopaedic Surgery

## 2021-09-05 ENCOUNTER — Other Ambulatory Visit: Payer: Self-pay

## 2021-09-05 DIAGNOSIS — G8929 Other chronic pain: Secondary | ICD-10-CM

## 2021-09-05 NOTE — Telephone Encounter (Signed)
Pt called and is wanting to get a cat scan done for his back pain.  ? ?CB [phone]  ?

## 2021-09-05 NOTE — Telephone Encounter (Signed)
MRI

## 2021-09-05 NOTE — Telephone Encounter (Signed)
LMOM for patient letting him know we sent referral for MRI  ?

## 2021-09-10 ENCOUNTER — Ambulatory Visit (INDEPENDENT_AMBULATORY_CARE_PROVIDER_SITE_OTHER): Payer: Medicare Other | Admitting: Podiatry

## 2021-09-10 ENCOUNTER — Other Ambulatory Visit: Payer: Self-pay

## 2021-09-10 ENCOUNTER — Encounter: Payer: Self-pay | Admitting: Podiatry

## 2021-09-10 DIAGNOSIS — B351 Tinea unguium: Secondary | ICD-10-CM | POA: Diagnosis not present

## 2021-09-10 DIAGNOSIS — N183 Chronic kidney disease, stage 3 unspecified: Secondary | ICD-10-CM

## 2021-09-10 NOTE — Progress Notes (Signed)
This patient returns to the office for evaluation and treatment of long thick painful nails .  This patient is unable to trim his own nails since the patient cannot reach his feet.  Patient says the nails are painful walking and wearing his shoes.  He returns for preventive foot care services. ? ?General Appearance  Alert, conversant and in no acute stress. ? ?Vascular  Dorsalis pedis and posterior tibial  pulses are weakly  palpable  bilaterally.  Capillary return is within normal limits  bilaterally. Temperature is within normal limits  bilaterally. ? ?Neurologic  Senn-Weinstein monofilament wire test within normal limits  bilaterally. Muscle power within normal limits bilaterally. ? ?Nails Thick disfigured discolored nails with subungual debris  from hallux to fifth toes bilaterally. No evidence of bacterial infection or drainage bilaterally. ? ?Orthopedic  No limitations of motion  feet .  No crepitus or effusions noted.  No bony pathology or digital deformities noted.  HAV  B/L. ? ?Skin  normotropic skin with no porokeratosis noted bilaterally.  No signs of infections or ulcers noted.    ? ?Onychomycosis  Pain in toes right foot  Pain in toes left foot ? ?Debridement  of nails  1-5  B/L with a nail nipper.  Nails were then filed using a dremel tool with no incidents.    RTC 12 weeks ? ? ?Gardiner Barefoot DPM  ?

## 2021-09-25 ENCOUNTER — Ambulatory Visit
Admission: RE | Admit: 2021-09-25 | Discharge: 2021-09-25 | Disposition: A | Discharge status: Home / Self Care | Payer: Medicare Other | Source: Ambulatory Visit | Attending: Orthopaedic Surgery | Admitting: Orthopaedic Surgery

## 2021-09-25 ENCOUNTER — Other Ambulatory Visit: Payer: Self-pay

## 2021-09-25 DIAGNOSIS — M545 Low back pain, unspecified: Secondary | ICD-10-CM

## 2021-10-10 ENCOUNTER — Encounter: Payer: Self-pay | Admitting: Orthopaedic Surgery

## 2021-10-10 ENCOUNTER — Other Ambulatory Visit: Payer: Self-pay

## 2021-10-10 ENCOUNTER — Ambulatory Visit (INDEPENDENT_AMBULATORY_CARE_PROVIDER_SITE_OTHER): Payer: Medicare Other | Admitting: Orthopaedic Surgery

## 2021-10-10 DIAGNOSIS — M5442 Lumbago with sciatica, left side: Secondary | ICD-10-CM | POA: Diagnosis not present

## 2021-10-10 DIAGNOSIS — G8929 Other chronic pain: Secondary | ICD-10-CM | POA: Diagnosis not present

## 2021-10-10 NOTE — Progress Notes (Signed)
The patient is a 88 year old gentleman who comes in today to go over MRI of his lumbar spine.  He is very active and younger appearing 67.  He does ambulate with a cane.  He has been having left-sided low back pain that does radiate in the sciatic area and down his left leg.  He does say Neurontin has helped him to 300 mg is too strong.  He is following up with the [place] this Friday and I recommended that he go down to just 100 mg.  He does have stated that helps him get rest at night. ? 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  He does seem to have a positive straight leg raise on the left side but no gross weakness.  There is numbness and tingling as well. ? ?Given the MRI findings I have suggested he consider an epidural steroid injection to the left side at L5-S1.  He is interested in that given the failure of conservative treatment.  He is not on blood thinning medications.  We will work on getting him scheduled for that type of intervention.  He agrees and all questions and concerns were answered and addressed. ?

## 2021-11-14 ENCOUNTER — Encounter: Payer: Self-pay | Admitting: Physical Medicine and Rehabilitation

## 2021-11-14 ENCOUNTER — Ambulatory Visit (INDEPENDENT_AMBULATORY_CARE_PROVIDER_SITE_OTHER): Payer: Medicare Other | Admitting: Physical Medicine and Rehabilitation

## 2021-11-14 ENCOUNTER — Ambulatory Visit: Payer: Self-pay

## 2021-11-14 DIAGNOSIS — M5416 Radiculopathy, lumbar region: Secondary | ICD-10-CM | POA: Diagnosis not present

## 2021-11-14 MED ORDER — METHYLPREDNISOLONE ACETATE 80 MG/ML IJ SUSP
80.0000 mg | Freq: Once | INTRAMUSCULAR | Status: AC
  Administered 2021-11-14: 80 mg

## 2021-11-14 NOTE — Patient Instructions (Signed)

## 2021-11-14 NOTE — Progress Notes (Signed)
Pt state lower back pain that travels down his left leg. Pt state bending and walking makes the pain worse. Pt state he takes over the counter pain meds to help ease his pain.  Numeric Pain Rating Scale and Functional Assessment Average Pain 6   In the last MONTH (on 0-10 scale) has pain interfered with the following?  1. General activity like being  able to carry out your everyday physical activities such as walking, climbing stairs, carrying groceries, or moving a chair?  Rating(8)   +Driver, -BT, -Dye Allergies.

## 2021-11-28 NOTE — Procedures (Signed)
Lumbar Epidural Steroid Injection - Interlaminar Approach with Fluoroscopic Guidance  Patient: Christopher Shepherd      Date of Birth: 05-01-1927 MRN: [id number] PCP: Willey Blade, MD      Visit Date: 11/14/2021   Universal Protocol:     Consent Given By: the patient  Position: PRONE  Additional Comments: Vital signs were monitored before and after the procedure. Patient was prepped and draped in the usual sterile fashion. The correct patient, procedure, and site was verified.   Injection Procedure Details:   Procedure diagnoses: Lumbar radiculopathy [M54.16]   Meds Administered:  Meds ordered this encounter  Medications   methylPREDNISolone acetate (DEPO-MEDROL) injection 80 mg     Laterality: Left  Location/Site:  L5-S1  Needle: 3.5 in., 20 ga. Tuohy  Needle Placement: Paramedian epidural  Findings:   -Comments: Excellent flow of contrast into the epidural space.  Procedure Details: Using a paramedian approach from the side mentioned above, the region overlying the inferior lamina was localized under fluoroscopic visualization and the soft tissues overlying this structure were infiltrated with 4 ml. of 1% Lidocaine without Epinephrine. The Tuohy needle was inserted into the epidural space using a paramedian approach.   The epidural space was localized using loss of resistance along with counter oblique bi-planar fluoroscopic views.  After negative aspirate for air, blood, and CSF, a 2 ml. volume of Isovue-250 was injected into the epidural space and the flow of contrast was observed. Radiographs were obtained for documentation purposes.    The injectate was administered into the level noted above.   Additional Comments:  The patient tolerated the procedure well Dressing: 2 x 2 sterile gauze and Band-Aid    Post-procedure details: Patient was observed during the procedure. Post-procedure instructions were reviewed.  Patient left the clinic in stable  condition.

## 2021-11-28 NOTE — Progress Notes (Signed)
Christopher Shepherd - 88 y.o. male MRN [id number]  Date of birth: 02-19-1927  Office Visit Note: Visit Date: 11/14/2021 PCP: Willey Blade, MD Referred by: Willey Blade, MD  Subjective: Chief Complaint  Patient presents with   Lower Back - Pain   Left Leg - Pain   HPI:  Christopher Shepherd is a 87 y.o. male who comes in today at the request of Dr. Jean Rosenthal for planned Left L5-S1 Lumbar Interlaminar epidural steroid injection with fluoroscopic guidance.  The patient has failed conservative care including home exercise, medications, time and activity modification.  This injection will be diagnostic and hopefully therapeutic.  Please see requesting physician notes for further details and justification. MRI reviewed with images and spine model.  MRI reviewed in the note below.   ROS Otherwise per HPI.  Assessment & Plan: Visit Diagnoses:    ICD-10-CM   1. Lumbar radiculopathy  M54.16 XR C-ARM NO REPORT    Epidural Steroid injection    methylPREDNISolone acetate (DEPO-MEDROL) injection 80 mg      Plan: No additional findings.   Meds & Orders:  Meds ordered this encounter  Medications   methylPREDNISolone acetate (DEPO-MEDROL) injection 80 mg    Orders Placed This Encounter  Procedures   XR C-ARM NO REPORT   Epidural Steroid injection    Follow-up: Return if symptoms worsen or fail to improve.   Procedures: No procedures performed  Lumbar Epidural Steroid Injection - Interlaminar Approach with Fluoroscopic Guidance  Patient: Christopher Shepherd      Date of Birth: [birth year] MRN: [id number] PCP: Willey Blade, MD      Visit Date: 11/14/2021   Universal Protocol:     Consent Given By: the patient  Position: PRONE  Additional Comments: Vital signs were monitored before and after the procedure. Patient was prepped and draped in the usual sterile fashion. The correct patient, procedure, and site was verified.   Injection Procedure Details:    Procedure diagnoses: Lumbar radiculopathy [M54.16]   Meds Administered:  Meds ordered this encounter  Medications   methylPREDNISolone acetate (DEPO-MEDROL) injection 80 mg     Laterality: Left  Location/Site:  L5-S1  Needle: 3.5 in., 20 ga. Tuohy  Needle Placement: Paramedian epidural  Findings:   -Comments: Excellent flow of contrast into the epidural space.  Procedure Details: Using a paramedian approach from the side mentioned above, the region overlying the inferior lamina was localized under fluoroscopic visualization and the soft tissues overlying this structure were infiltrated with 4 ml. of 1% Lidocaine without Epinephrine. The Tuohy needle was inserted into the epidural space using a paramedian approach.   The epidural space was localized using loss of resistance along with counter oblique bi-planar fluoroscopic views.  After negative aspirate for air, blood, and CSF, a 2 ml. volume of Isovue-250 was injected into the epidural space and the flow of contrast was observed. Radiographs were obtained for documentation purposes.    The injectate was administered into the level noted above.   Additional Comments:  The patient tolerated the procedure well Dressing: 2 x 2 sterile gauze and Band-Aid    Post-procedure details: Patient was observed during the procedure. Post-procedure instructions were reviewed.  Patient left the clinic in stable condition.   Clinical History: No specialty comments available.     Objective:  VS:  HT:    WT:   BMI:     BP:   HR: bpm  TEMP: ( )  RESP:  Physical Exam Vitals and nursing note reviewed.  Constitutional:      General: He is not in acute distress.    Appearance: Normal appearance. He is not ill-appearing.  HENT:     Head: Normocephalic and atraumatic.     Right Ear: External ear normal.     Left Ear: External ear normal.     Nose: No congestion.  Eyes:     Extraocular Movements: Extraocular movements intact.   Cardiovascular:     Rate and Rhythm: Normal rate.     Pulses: Normal pulses.  Pulmonary:     Effort: Pulmonary effort is normal. No respiratory distress.  Abdominal:     General: There is no distension.     Palpations: Abdomen is soft.  Musculoskeletal:        General: No tenderness or signs of injury.     Cervical back: Neck supple.     Right lower leg: No edema.     Left lower leg: No edema.     Comments: Patient has good distal strength without clonus.  Skin:    Findings: No erythema or rash.  Neurological:     General: No focal deficit present.     Mental Status: He is alert and oriented to person, place, and time.     Sensory: No sensory deficit.     Motor: No weakness or abnormal muscle tone.     Coordination: Coordination normal.  Psychiatric:        Mood and Affect: Mood normal.        Behavior: Behavior normal.     Imaging: No results found.

## 2021-11-30 ENCOUNTER — Encounter: Payer: Self-pay | Admitting: Podiatry

## 2021-11-30 ENCOUNTER — Ambulatory Visit (INDEPENDENT_AMBULATORY_CARE_PROVIDER_SITE_OTHER): Payer: Medicare Other | Admitting: Podiatry

## 2021-11-30 DIAGNOSIS — Z23 Encounter for immunization: Secondary | ICD-10-CM | POA: Insufficient documentation

## 2021-11-30 DIAGNOSIS — N183 Chronic kidney disease, stage 3 unspecified: Secondary | ICD-10-CM

## 2021-11-30 DIAGNOSIS — B351 Tinea unguium: Secondary | ICD-10-CM | POA: Diagnosis not present

## 2021-11-30 DIAGNOSIS — M47816 Spondylosis without myelopathy or radiculopathy, lumbar region: Secondary | ICD-10-CM | POA: Insufficient documentation

## 2021-11-30 NOTE — Progress Notes (Signed)
This patient returns to the office for evaluation and treatment of long thick painful nails .  This patient is unable to trim his own nails since the patient cannot reach his feet.  Patient says the nails are painful walking and wearing his shoes.  He returns for preventive foot care services.  General Appearance  Alert, conversant and in no acute stress.  Vascular  Dorsalis pedis and posterior tibial  pulses are weakly  palpable  bilaterally.  Capillary return is within normal limits  bilaterally. Temperature is within normal limits  bilaterally.  Neurologic  Senn-Weinstein monofilament wire test within normal limits  bilaterally. Muscle power within normal limits bilaterally.  Nails Thick disfigured discolored nails with subungual debris  from hallux to fifth toes bilaterally. No evidence of bacterial infection or drainage bilaterally.  Orthopedic  No limitations of motion  feet .  No crepitus or effusions noted.  No bony pathology or digital deformities noted.  HAV  B/L.  Skin  normotropic skin with no porokeratosis noted bilaterally.  No signs of infections or ulcers noted.     Onychomycosis  Pain in toes right foot  Pain in toes left foot  Debridement  of nails  1-5  B/L with a nail nipper.  Nails were then filed using a dremel tool with no incidents.    RTC 12 weeks   Gardiner Barefoot DPM

## 2021-12-04 ENCOUNTER — Encounter: Payer: Self-pay | Admitting: Internal Medicine

## 2021-12-04 ENCOUNTER — Ambulatory Visit (INDEPENDENT_AMBULATORY_CARE_PROVIDER_SITE_OTHER): Payer: Medicare Other | Admitting: Internal Medicine

## 2021-12-04 DIAGNOSIS — G4733 Obstructive sleep apnea (adult) (pediatric): Secondary | ICD-10-CM | POA: Diagnosis not present

## 2021-12-04 DIAGNOSIS — R052 Subacute cough: Secondary | ICD-10-CM

## 2021-12-04 NOTE — Progress Notes (Signed)
Subjective:    Patient ID: Christopher Shepherd, male    DOB: Oct 10, 1926, 88 y.o.   MRN: [id number]  HPI male never smoker followed for cough, asthma, allergic rhinitis, obstructive sleep apnea NPSG 04/06/2018-AHI 60.8/hour, desaturation to 91%, body weight 202 pounds ---------------------------------------------------------------------------------------------  09/05/20- 88 year old male [name], never smoker followed for Cough, Asthma, Allergic Rhinitis, OSA, Lung Nodule, complicated by HTN, Osteoarthritis, Aortic Atherosclerosis, Orthostatic Hypotension,  CPAP auto 10-20/ [facility]    Replaced March, 2022  ResMed AirSense 10 Download- Body weight today 182 lbs Covid vax-3 Moderna Flu vax-had ECHO 02/17/20- did not suggest pulmonary hypertension -----Patient states that he is sleeps good, has new machine and just got new supplies and has not been using machine. Wants to talk about CT scan done on 3/3/022 He agrees to start using replacement machine now.  CT reviewed with discussion of possible PAH. He feels well and denies chest symptoms. I suggested at age 43, conservative observation is reasonable. CT chest 08/31/20- IMPRESSION: 1. Interval resolution of a flattened, nodular density of the inferior right upper lobe abutting the minor fissure, consistent with resolved infection or inflammation. No further follow-up required. 2. Gross enlargement of the main pulmonary artery, measuring up to 4.2 cm, as can be seen in pulmonary hypertension. 3. Coronary artery disease. Aortic Atherosclerosis (ICD10-I70.0).   12/04/21- 88 year old male [name], never smoker followed for Cough, Asthma, Allergic Rhinitis, OSA, Lung Nodule, complicated by HTN, Osteoarthritis, Aortic Atherosclerosis, CAD, Orthostatic Hypotension,  CPAP auto 10-20/ [facility]    Replaced March, 2022  ResMed AirSense 10 Download-not using Body weight today 165 lbs Covid vax-5 Moderna Flu vax-had ECHO in 2021 indicated normal PA  pressure. Here today with wife.  He stopped using CPAP and does not feel he needs it although she indicates she does still snore.  He describes some hoarseness in the morning that fades during the day and I suggested this morning hoarseness may be because of snoring since he stopped CPAP.  Note he has lost almost 40 pounds since his original sleep study.  Gabapentin for peripheral neuropathy was causing daytime sleepiness and dosage has been reduced.  He declines ENT referral for hoarseness and denies significant cough or difficulty swallowing.  He does want to continue being followed here.   Review of Systems- see HPI + = positive Constitutional:   +  weight loss, night sweats, fevers, chills, fatigue, lassitude. HEENT:   No-  headaches, difficulty swallowing, tooth/dental problems, sore throat,       No-  sneezing, itching, ear ache,    nasal congestion, +post nasal drip,  CV:  No-   chest pain, orthopnea, PND, swelling in lower extremities, anasarca,  dizziness, palpitations Resp: No-   shortness of breath with exertion or at rest.               productive cough,   non-productive cough,  No-  coughing up of blood.               change in color of mucus.   wheezing.   Skin: No-   rash or lesions. GI:  No-   heartburn, indigestion, abdominal pain, nausea, vomiting,  GU:  MS:  No-   joint pain or swelling.   Neuro- nothing unusual  Psych:  No- change in mood or affect. No depression or anxiety.  No memory loss.   Objective:   Physical Exam General- Alert, Oriented, Affect-appropriate, Distress- none acute; elderly Skin- rash-none, lesions- none, excoriation- none Lymphadenopathy- none Head- atraumatic  Eyes- Gross vision intact, PERRLA, conjunctivae clear secretions            Ears- Hearing, canals-normal            Nose- + turbinate edema, no-Septal dev, mucus, polyps, erosion, perforation             Throat- Mallampati IV , mucosa dry , drainage- none, tonsils- atrophic Neck-  flexible , trachea midline, no stridor , thyroid nl, carotid no bruit Chest - symmetrical excursion , unlabored           Heart/CV- RRR with frequent skipped beats , no murmur , no gallop  , no rub, nl s1 s2                           - JVD- none , edema+trace, stasis changes- none, varices- none           Lung- clear, wheeze- none, cough-none , dullness-none, rub- none           Chest wall-  Abd-  Br/ Gen/ Rectal- Not done, not indicated Extrem- cyanosis- none, clubbing, none, atrophy- none, strength- nl, + cane Neuro- grossly intact to observation Assessment & Plan:

## 2021-12-04 NOTE — Patient Instructions (Signed)
Ok to stop using CPAP if you choose.  You can also decide to start using it again at any time.  I suspect your morning hoarseness might get better if you were using CPAP to prevent snoring.  Please call if we can help

## 2021-12-04 NOTE — Assessment & Plan Note (Signed)
He minimizes this currently.

## 2021-12-04 NOTE — Assessment & Plan Note (Addendum)
At age 88 and after losing almost 40 pounds, if he chooses not to use CPAP I think that is quite all right as discussed.  I reminded him of symptoms-especially daytime tiredness, loud snoring as close that he might want to try CPAP again.  Mild morning hoarseness may be related to snoring as discussed.

## 2021-12-10 ENCOUNTER — Emergency Department (HOSPITAL_COMMUNITY)
Admission: EM | Admit: 2021-12-10 | Discharge: 2021-12-10 | Disposition: A | Discharge status: Home / Self Care | Payer: No Typology Code available for payment source | Attending: Emergency Medicine | Admitting: Emergency Medicine

## 2021-12-10 ENCOUNTER — Other Ambulatory Visit: Payer: Self-pay

## 2021-12-10 ENCOUNTER — Emergency Department (HOSPITAL_COMMUNITY): Payer: No Typology Code available for payment source

## 2021-12-10 ENCOUNTER — Encounter (HOSPITAL_COMMUNITY): Payer: Self-pay | Admitting: Emergency Medicine

## 2021-12-10 DIAGNOSIS — N189 Chronic kidney disease, unspecified: Secondary | ICD-10-CM | POA: Diagnosis not present

## 2021-12-10 DIAGNOSIS — T887XXA Unspecified adverse effect of drug or medicament, initial encounter: Secondary | ICD-10-CM

## 2021-12-10 DIAGNOSIS — R400 Somnolence: Secondary | ICD-10-CM | POA: Diagnosis present

## 2021-12-10 DIAGNOSIS — D631 Anemia in chronic kidney disease: Secondary | ICD-10-CM | POA: Insufficient documentation

## 2021-12-10 DIAGNOSIS — T426X5A Adverse effect of other antiepileptic and sedative-hypnotic drugs, initial encounter: Secondary | ICD-10-CM | POA: Insufficient documentation

## 2021-12-10 DIAGNOSIS — D696 Thrombocytopenia, unspecified: Secondary | ICD-10-CM | POA: Diagnosis not present

## 2021-12-10 DIAGNOSIS — J45909 Unspecified asthma, uncomplicated: Secondary | ICD-10-CM | POA: Insufficient documentation

## 2021-12-10 LAB — CBC
HCT: 37.6 % — ABNORMAL LOW (ref 39.0–52.0)
Hemoglobin: 11.8 g/dL — ABNORMAL LOW (ref 13.0–17.0)
MCH: 28.4 pg (ref 26.0–34.0)
MCHC: 31.4 g/dL (ref 30.0–36.0)
MCV: 90.4 fL (ref 80.0–100.0)
Platelets: 132 10*3/uL — ABNORMAL LOW (ref 150–400)
RBC: 4.16 MIL/uL — ABNORMAL LOW (ref 4.22–5.81)
RDW: 13.6 % (ref 11.5–15.5)
WBC: 4.9 10*3/uL (ref 4.0–10.5)
nRBC: 0 % (ref 0.0–0.2)

## 2021-12-10 LAB — BASIC METABOLIC PANEL
Anion gap: 7 (ref 5–15)
BUN: 26 mg/dL — ABNORMAL HIGH (ref 8–23)
CO2: 28 mmol/L (ref 22–32)
Calcium: 9.1 mg/dL (ref 8.9–10.3)
Chloride: 108 mmol/L (ref 98–111)
Creatinine, Ser: 1.74 mg/dL — ABNORMAL HIGH (ref 0.61–1.24)
GFR, Estimated: 36 mL/min — ABNORMAL LOW (ref >60)
Glucose, Bld: 100 mg/dL — ABNORMAL HIGH (ref 70–99)
Potassium: 3.8 mmol/L (ref 3.5–5.1)
Sodium: 143 mmol/L (ref 135–145)

## 2021-12-10 NOTE — Discharge Instructions (Signed)
Follow-up with the [facility] on Wednesday as scheduled. I have attached copies of your labs and CT on back for physician review. Return here for new concerns-- focal numbness/weakness, trouble walking, etc.

## 2021-12-10 NOTE — ED Triage Notes (Addendum)
Per EMS, patient from home, increased drowsiness and dizziness x2 weeks each morning after taking medications. Takes blood pressure medications and gabapentin. States symptoms decrease throughout the day. A&Ox4. Lives with his wife.  BP 110/60

## 2021-12-10 NOTE — ED Provider Triage Note (Signed)
Emergency Medicine Provider Triage Evaluation Note  Christopher Shepherd , a 88 y.o. male  was evaluated in triage.  Pt complains of balance problem, weakness, dizziness, drowsiness. He thinks associated with medication. Take gabapentin, blood pressure medicine. Worried about sleepiness in mornings. Reports he had an episode this morning where he just fell asleep and didn't know how, took a while to be aroused.  Review of Systems  Positive: Drowsiness, dizziness, weakness Negative: Numbness, facial droop  Physical Exam  BP (!) 116/52 (BP Location: Left Arm)   Pulse 61   Temp 98.4 F (36.9 C) (Oral)   Resp 16   Ht 5\' 6"  (1.676 m)   Wt 74.8 kg   SpO2 100%   BMI 26.63 kg/m  Gen:   Awake, no distress   Resp:  Normal effort  MSK:   Moves extremities without difficulty  Other:    Medical Decision Making  Medically screening exam initiated at 12:18 PM.  Appropriate orders placed.  Christopher Shepherd was informed that the remainder of the evaluation will be completed by another provider, this initial triage assessment does not replace that evaluation, and the importance of remaining in the ED until their evaluation is complete.  Workup initiated   Christopher Shepherd, [place] 12/10/21 1223

## 2021-12-10 NOTE — ED Provider Notes (Signed)
[facility] DEPT Provider Note   CSN: [id number] Arrival date & time: 12/10/21  1149     History  Chief Complaint  Patient presents with   Medication Reaction    Quintavius Niebuhr is a 88 y.o. male.  The history is provided by the patient and medical records.   88 y.o. M with hx of anemia, asthma, CKD, OSA, vit d deficiency, presenting to the ED for possible medication reaction.  He was recently started on gabapentin 300mg  TID.  States he felt very drowsy while taking this so they decreased it to 100mg  TID.  He states it seems better with that dosage but still have episodes where he feels drowsy.  Sister reports she has called him a few times and he did not answer, reported that he could hear the phone rang but could not wake up enough to answer.  He has not had any episodes of frank confusion, limb numbness or weakness, difficulty speaking.  He has not had any falls.  He denies any headache, chest pain, or shortness of breath.  Also reports recently at the [place], they have increased his hydralazine from 25 to 50 mg twice daily.  He is not sure if that may be contributing as well.  He had an appointment at the [facility] Wednesday for review of his medications, however given his episodes family brought him here instead.  He has no history of TIA or stroke.  Currently in the ED, he is asymptomatic.  Home Medications Prior to Admission medications   Medication Sig Start Date End Date Taking? Authorizing Provider  acetaminophen (TYLENOL) 325 MG tablet TAKE TWO TABLETS BY MOUTH DAILY AS NEEDED FOR PAIN 09/05/21   [provider]  acetaminophen (TYLENOL) 650 MG CR tablet Take 650 mg by mouth every 8 (eight) hours as needed for pain.    [provider]  amLODipine (NORVASC) 10 MG tablet Take 10 mg by mouth daily. Patient not taking: Reported on 12/04/2021    [provider]  cetirizine (ZYRTEC) 5 MG tablet Take 1 tablet (5 mg total) by mouth at bedtime.  07/12/21 09/10/21  Lynden Oxford Scales, PA-C  chlorhexidine (PERIDEX) 0.12 % solution chlorhexidine gluconate 0.12 % mouthwash    [provider]  Cholecalciferol (VITAMIN D-3) 1000 units CAPS Take 1 capsule by mouth daily.     [provider]  Cholecalciferol 50 MCG (2000 UT) TABS Take 1 tablet by mouth daily. 09/06/21   [provider]  Coenzyme Q10 (CO Q 10 PO) Co Q-10 300 mg capsule  Take 1 capsule every day by oral route.    [provider]  furosemide (LASIX) 20 MG tablet Take 1 tablet by mouth daily. 08/23/20   [provider]  gabapentin (NEURONTIN) 100 MG capsule TAKE TWO CAPSULES BY MOUTH AT BEDTIME FOR BACK PAIN 10/12/21   [provider]  gabapentin (NEURONTIN) 300 MG capsule Take 1 capsule (300 mg total) by mouth at bedtime. 08/08/21   Mcarthur Rossetti, MD  hydrALAZINE (APRESOLINE) 25 MG tablet Take 50 mg by mouth 2 (two) times daily. 03/10/20   [provider]  lidocaine (LIDODERM) 5 % Place 1 patch onto the skin daily. Remove & Discard patch within 12 hours or as directed by MD 09/03/21   Malvin Johns, MD  losartan (COZAAR) 100 MG tablet Take 100 mg by mouth every morning.    [provider]  methylPREDNISolone (MEDROL DOSEPAK) 4 MG TBPK tablet Take 24 mg on day 1,  20 mg on day 2, 16 mg on day 3, 12 mg on day 4, 8 mg on day 5, 4 mg on day 6. 07/12/21   Lynden Oxford Scales, PA-C  metoprolol succinate (TOPROL-XL) 25 MG 24 hr tablet Take 12.5 mg by mouth daily.     [provider]  mometasone (NASONEX) 50 MCG/ACT nasal spray 1-2 sprays each nostril daily at bedtime while needed Patient taking differently: Place 1-2 sprays into the nose at bedtime as needed (allergies). 08/12/16   Baird Lyons D, MD  Omega 3 1000 MG CAPS Take 1 capsule by mouth.     [provider]  rosuvastatin (CRESTOR) 20 MG tablet Take 10 mg by mouth every evening.     [provider]  vitamin B-12 (CYANOCOBALAMIN)  100 MCG tablet Take 100 mcg by mouth daily.    [provider]      Allergies    Fosinopril    Review of Systems   Review of Systems  Constitutional:        Drowsy episodes  All other systems reviewed and are negative.   Physical Exam Updated Vital Signs BP (!) 150/71   Pulse 60   Temp 98.4 F (36.9 C) (Oral)   Resp 20   Ht 5\' 6"  (1.676 m)   Wt 74.8 kg   SpO2 100%   BMI 26.63 kg/m   Physical Exam Vitals and nursing note reviewed.  Constitutional:      Appearance: He is well-developed.     Comments: Elderly, very pleasant  HENT:     Head: Normocephalic and atraumatic.  Eyes:     General: Lids are normal.     Conjunctiva/sclera: Conjunctivae normal.     Pupils: Pupils are equal, round, and reactive to light.     Comments: PERRL bilaterally  Cardiovascular:     Rate and Rhythm: Normal rate and regular rhythm.     Heart sounds: Normal heart sounds.  Pulmonary:     Effort: Pulmonary effort is normal.     Breath sounds: Normal breath sounds.  Abdominal:     General: Bowel sounds are normal.     Palpations: Abdomen is soft.  Musculoskeletal:        General: Normal range of motion.     Cervical back: Normal range of motion.  Skin:    General: Skin is warm and dry.  Neurological:     Mental Status: He is alert and oriented to person, place, and time.     Comments: AAOx3, able to answer questions and follow commands without difficulty, equal strength of UE and LE bilaterally, no dysmetria with finger-nose-finger bilaterally; speech is clear and goal oriented, no facial asymmetry     ED Results / Procedures / Treatments   Labs (all labs ordered are listed, but only abnormal results are displayed) Labs Reviewed  CBC - Abnormal; Notable for the following components:      Result Value   RBC 4.16 (*)    Hemoglobin 11.8 (*)    HCT 37.6 (*)    Platelets 132 (*)    All other components within normal limits  BASIC METABOLIC PANEL - Abnormal; Notable for the  following components:   Glucose, Bld 100 (*)    BUN 26 (*)    Creatinine, Ser 1.74 (*)    GFR, Estimated 36 (*)    All other components within normal limits    EKG None  Radiology CT Head Wo Contrast  Result Date: 12/10/2021 CLINICAL DATA:  Neuro deficit, acute, stroke suspected EXAM: CT HEAD WITHOUT CONTRAST TECHNIQUE: Contiguous axial images were obtained from the base of the skull through the vertex without intravenous contrast. RADIATION DOSE REDUCTION: This exam was performed according to the departmental dose-optimization program which includes automated exposure control, adjustment of the mA and/or kV according to patient size and/or use of iterative reconstruction technique. COMPARISON:  CT head April 02, 2017. FINDINGS: Brain: No evidence of acute infarction, hemorrhage, hydrocephalus, extra-axial collection or mass lesion/mass effect. Patchy white matter hypodensities, nonspecific but compatible with chronic microvascular disease. Vascular: No hyperdense vessel identified. Skull: No acute fracture. Sinuses/Orbits: Clear sinuses.  No acute orbital findings. Other: No mastoid effusions. IMPRESSION: No evidence of acute intracranial abnormality. Electronically Signed   By: Margaretha Sheffield M.D.   On: 12/10/2021 13:31    Procedures Procedures    Medications Ordered in ED Medications - No data to display  ED Course/ Medical Decision Making/ A&P                           Medical Decision Making  88 year old male presenting to the ED with possible medication reaction.  He was recently started on gabapentin at 300 mg 3 times daily, now decreased to 100 mg 3 times daily.  Still having episodes of sleepiness/drowsiness.  States he falls asleep at random times, hard to arouse when so.  Has not had any falls or head trauma.  Currently he is awake, alert, oriented.  He is answering questions and following commands without difficulty.  He has no focal neurologic deficits.  His speech is  clear and goal oriented.  He does not seem drowsy or confused currently.   EKG is nonischemic.  Labs reviewed--serum creatinine appears slightly above his baseline, has thrombocytopenia which is known and chronic.  CT head obtained which is negative for any acute findings.  Patient is currently asymptomatic, states he feels at baseline.  Orthostatics were obtained and are WNL.  Denies dizziness with standing.  I suspect a lot of his symptoms related to gabapentin.  He does not have any focal deficits on exam to suggest TIA/CVA.  [facility] will discuss [facility] then.  Given copies of labs and CT from today for review.  Can return here for any new/acute changes.  Discussed with attending physician, Dr. Alvino Chapel, who agrees with plan of care.  Final Clinical Impression(s) / ED Diagnoses Final diagnoses:  Medication side effect    Rx / DC Orders ED Discharge Orders     None         Larene Pickett, PA-C 12/10/21 2340    Davonna Belling, MD 12/11/21 0003

## 2022-01-14 ENCOUNTER — Ambulatory Visit
Admission: EM | Admit: 2022-01-14 | Discharge: 2022-01-14 | Disposition: A | Discharge status: Home / Self Care | Payer: No Typology Code available for payment source

## 2022-01-14 ENCOUNTER — Encounter: Payer: Self-pay | Admitting: Emergency Medicine

## 2022-01-14 DIAGNOSIS — N184 Chronic kidney disease, stage 4 (severe): Secondary | ICD-10-CM

## 2022-01-14 NOTE — ED Triage Notes (Signed)
Pt here with bilateral leg and foot swelling with bottom foot pain x 1 year. States it is better after elevating his legs.

## 2022-01-14 NOTE — ED Provider Notes (Addendum)
[facility]    CSN: [id number] Arrival date & time: 01/14/22  1453    HISTORY   Chief Complaint  Patient presents with   Foot Pain   Leg Swelling   HPI Christopher Shepherd is a pleasant, 88 y.o. male who presents to urgent care today. Patient is complaining of swelling and both of his lower extremities.  Patient was also complaining of pain on the bottom of his feet this been going on for the past year.  Patient states that elevating his feet provides relief.  Patient states before COVID-19, he used to garden and grows and vegetables but has not done so over the past few years and has been relying on eating a lot of shelf stable foods and canned vegetables.  Patient states he believes that this is about when the swelling in his legs began EMR reviewed.  Patient has a known history of chronic kidney disease stage IV, last GFR was 36 in June 2023.  Patient has been to the emergency room multiple times complaining of lower extremity swelling, last venous Doppler did not show any sign of DVT.  The history is provided by the patient.   Past Medical History:  Diagnosis Date   Allergic rhinitis    Anemia    Arthritis    osteoarthritis   Asthma 1994   with bronchitis   Cough    Hyperlipidemia    Hypertension    Obstructive sleep apnea    uses a C-Pap   Patient Active Problem List   Diagnosis Date Noted   Encounter for immunization 11/30/2021   Spondylosis without myelopathy or radiculopathy, lumbar region 11/30/2021   Encounter for fitting and adjustment of hearing aid 12/13/2020   Abnormal glucose level 09/06/2020   Artificial knee joint present 09/06/2020   Chronic kidney disease 09/06/2020   Edema 09/06/2020   Obesity 09/06/2020   Polyphagia 09/06/2020   Primary localized osteoarthrosis, lower leg 09/06/2020   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   Chronic asthmatic bronchitis with acute exacerbation ([place]) 07/16/2014   Pain in lower limb 11/24/2013    S/P total knee arthroplasty 10/23/2013   Hyperlipidemia 10/08/2013   Constipation 10/08/2013   Essential hypertension, benign 10/08/2013   Hiccups 10/08/2013   Acute blood loss anemia 10/06/2013   OA (osteoarthritis) of knee 10/04/2013   Onychomycosis 10/30/2012   Pain in joint, ankle and foot 10/30/2012   Allergic rhinitis due to pollen 09/05/2010   Vitamin D deficiency 03/03/2009   Obstructive sleep apnea 04/16/2007   Allergic-infective asthma 04/16/2007   COUGH 04/16/2007   History of colonic polyps 08/06/2005   Past Surgical History:  Procedure Laterality Date   COLONOSCOPY W/ POLYPECTOMY     none     TOTAL KNEE ARTHROPLASTY Right 10/04/2013   Procedure: RIGHT TOTAL KNEE ARTHROPLASTY;  Surgeon: Gearlean Alf, MD;  Location: [facility];  Service: Orthopedics;  Laterality: Right;    Home Medications    Prior to Admission medications   Medication Sig Start Date End Date Taking? Authorizing Provider  acetaminophen (TYLENOL) 325 MG tablet TAKE TWO TABLETS BY MOUTH DAILY AS NEEDED FOR PAIN 09/05/21   [provider]  acetaminophen (TYLENOL) 650 MG CR tablet Take 650 mg by mouth every 8 (eight) hours as needed for pain.    [provider]  amLODipine (NORVASC) 10 MG tablet Take 10 mg by mouth daily. Patient not taking: Reported on 12/04/2021    [provider]  cetirizine (ZYRTEC) 5 MG tablet Take  1 tablet (5 mg total) by mouth at bedtime. 07/12/21 09/10/21  Lynden Oxford Scales, PA-C  chlorhexidine (PERIDEX) 0.12 % solution chlorhexidine gluconate 0.12 % mouthwash    [provider]  Cholecalciferol (VITAMIN D-3) 1000 units CAPS Take 1 capsule by mouth daily.     [provider]  Cholecalciferol 50 MCG (2000 UT) TABS Take 1 tablet by mouth daily. 09/06/21   [provider]  Coenzyme Q10 (CO Q 10 PO) Co Q-10 300 mg capsule  Take 1 capsule every day by oral route.    [provider]  furosemide (LASIX) 20 MG tablet Take 1 tablet  by mouth daily. 08/23/20   [provider]  gabapentin (NEURONTIN) 100 MG capsule TAKE TWO CAPSULES BY MOUTH AT BEDTIME FOR BACK PAIN 10/12/21   [provider]  gabapentin (NEURONTIN) 300 MG capsule Take 1 capsule (300 mg total) by mouth at bedtime. 08/08/21   Mcarthur Rossetti, MD  hydrALAZINE (APRESOLINE) 25 MG tablet Take 50 mg by mouth 2 (two) times daily. 03/10/20   [provider]  lidocaine (LIDODERM) 5 % Place 1 patch onto the skin daily. Remove & Discard patch within 12 hours or as directed by MD 09/03/21   Malvin Johns, MD  losartan (COZAAR) 100 MG tablet Take 100 mg by mouth every morning.    [provider]  methylPREDNISolone (MEDROL DOSEPAK) 4 MG TBPK tablet Take 24 mg on day 1, 20 mg on day 2, 16 mg on day 3, 12 mg on day 4, 8 mg on day 5, 4 mg on day 6. 07/12/21   Lynden Oxford Scales, PA-C  metoprolol succinate (TOPROL-XL) 25 MG 24 hr tablet Take 12.5 mg by mouth daily.     [provider]  mometasone (NASONEX) 50 MCG/ACT nasal spray 1-2 sprays each nostril daily at bedtime while needed Patient taking differently: Place 1-2 sprays into the nose at bedtime as needed (allergies). 08/12/16   Baird Lyons D, MD  Omega 3 1000 MG CAPS Take 1 capsule by mouth.     [provider]  rosuvastatin (CRESTOR) 20 MG tablet Take 10 mg by mouth every evening.     [provider]  vitamin B-12 (CYANOCOBALAMIN) 100 MCG tablet Take 100 mcg by mouth daily.    [provider]    Family History Family History  Problem Relation Age of Onset   Cancer Father        cause of death   Colon cancer Mother        cause of death   Social History Social History   Tobacco Use   Smoking status: Former    Packs/day: 0.20    Years: 15.00    Total pack years: 3.00    Types: Cigarettes    Quit date: 07/01/1968    Years since quitting: 53.5   Smokeless tobacco: Former   Tobacco comments:    Quit 50 yrs ago as of 2014  Vaping Use    Vaping Use: Never used  Substance Use Topics   Alcohol use: No    Alcohol/week: 3.0 standard drinks of alcohol    Types: 3 Cans of beer per week   Drug use: No   Allergies   Fosinopril  Review of Systems Review of Systems Pertinent findings revealed after performing a 14 point review of systems has been noted in the history of present illness.  Physical Exam Triage Vital Signs ED Triage Vitals  Enc Vitals Group     BP  04/27/21 0827 (!) 147/82     Pulse Rate 04/27/21 0827 72     Resp 04/27/21 0827 18     Temp 04/27/21 0827 98.3 F (36.8 C)     Temp Source 04/27/21 0827 Oral     SpO2 04/27/21 0827 98 %     Weight --      Height --      Head Circumference --      Peak Flow --      Pain Score 04/27/21 0826 5     Pain Loc --      Pain Edu? --      Excl. in [place]? --   No data found.  Updated Vital Signs BP (!) 149/71   Pulse (!) 57   Temp 98.4 F (36.9 C)   Resp 20   SpO2 98%   Physical Exam Vitals reviewed: Physical exam deferred.     Visual Acuity Right Eye Distance:   Left Eye Distance:   Bilateral Distance:    Right Eye Near:   Left Eye Near:    Bilateral Near:     UC Couse / Diagnostics / Procedures:     Radiology No results found.  Procedures Procedures (including critical care time) EKG  Pending results:  Labs Reviewed - No data to display  Medications Ordered in UC: Medications - No data to display  UC Diagnoses / Final Clinical Impressions(s)   I have reviewed the triage vital signs and the nursing notes.  Pertinent labs & imaging results that were available during my care of the patient were reviewed by me and considered in my medical decision making (see chart for details).    Final diagnoses:  Chronic kidney disease, stage IV (severe) ([place])   Patient was provided with detailed results of his recent laboratory work, patient states he was unaware that he has CKD stage IV.  Patient advised to follow-up with primary care.  No  services were provided during his visit today.  ED Prescriptions   None    PDMP not reviewed this encounter.  Pending results:  Labs Reviewed - No data to display  Discharge Instructions: Discharge Instructions   None     Disposition Upon Discharge:  Condition: stable for discharge home  Patient presented with an acute illness with associated systemic symptoms and significant discomfort requiring urgent management. In my opinion, this is a condition that a prudent lay person (someone who possesses an average knowledge of health and medicine) may potentially expect to result in complications if not addressed urgently such as respiratory distress, impairment of bodily function or dysfunction of bodily organs.   Routine symptom specific, illness specific and/or disease specific instructions were discussed with the patient and/or caregiver at length.   As such, the patient has been evaluated and assessed, work-up was performed and treatment was provided in alignment with urgent care protocols and evidence based medicine.  Patient/parent/caregiver has been advised that the patient may require follow up for further testing and treatment if the symptoms continue in spite of treatment, as clinically indicated and appropriate.  Patient/parent/caregiver has been advised to return to the [facility] or PCP if no better; to PCP or the Emergency Department if new signs and symptoms develop, or if the current signs or symptoms continue to change or worsen for further workup, evaluation and treatment as clinically indicated and appropriate  The patient will follow up with their current PCP if and as advised. If the patient does not currently have a  PCP we will assist them in obtaining one.   The patient may need specialty follow up if the symptoms continue, in spite of conservative treatment and management, for further workup, evaluation, consultation and treatment as clinically indicated and  appropriate.   Patient/parent/caregiver verbalized understanding and agreement of plan as discussed.  All questions were addressed during visit.  Please see discharge instructions below for further details of plan.  This office note has been dictated using Museum/gallery curator.  Unfortunately, this method of dictation can sometimes lead to typographical or grammatical errors.  I apologize for your inconvenience in advance if this occurs.  Please do not hesitate to reach out to me if clarification is needed.      Lynden Oxford Scales, PA-C 01/14/22 1542    Lynden Oxford Scales, PA-C 01/14/22 [phone]

## 2022-01-25 ENCOUNTER — Ambulatory Visit
Admission: EM | Admit: 2022-01-25 | Discharge: 2022-01-25 | Disposition: A | Discharge status: Home / Self Care | Payer: Medicare Other | Attending: Emergency Medicine | Admitting: Emergency Medicine

## 2022-01-25 DIAGNOSIS — J029 Acute pharyngitis, unspecified: Secondary | ICD-10-CM | POA: Diagnosis not present

## 2022-01-25 DIAGNOSIS — Z20822 Contact with and (suspected) exposure to covid-19: Secondary | ICD-10-CM

## 2022-01-25 DIAGNOSIS — R490 Dysphonia: Secondary | ICD-10-CM | POA: Diagnosis not present

## 2022-01-25 NOTE — Discharge Instructions (Signed)
You were tested for COVID-19 today.  The result of your COVID-19 test will be posted to your MyChart once it is complete, typically this takes 36 to 48 hours.    If your COVID-19 test is positive, please advise the nurse whether you are interested in taking the recommended antiviral for COVID-19 called Molnupiravir.  Thank you for visiting urgent care today, it was a pleasure to see you again.

## 2022-01-25 NOTE — ED Provider Notes (Signed)
[facility]    CSN: [id number] Arrival date & time: 01/25/22  4098    HISTORY  No chief complaint on file.  HPI Christopher Shepherd is a pleasant, 88 y.o. male who presents to urgent care today. Patient complains of hoarseness of voice since Labor Day.  Of note, patient has been seen at this clinic as well as the ED since [facility] and did not express concerns about his voice during either of those visits.    Past Medical History:  Diagnosis Date   Allergic rhinitis    Anemia    Arthritis    osteoarthritis   Asthma 1994   with bronchitis   Cough    Hyperlipidemia    Hypertension    Obstructive sleep apnea    uses a C-Pap   Patient Active Problem List   Diagnosis Date Noted   Encounter for immunization 11/30/2021   Spondylosis without myelopathy or radiculopathy, lumbar region 11/30/2021   Encounter for fitting and adjustment of hearing aid 12/13/2020   Abnormal glucose level 09/06/2020   Artificial knee joint present 09/06/2020   Chronic kidney disease 09/06/2020   Edema 09/06/2020   Obesity 09/06/2020   Polyphagia 09/06/2020   Primary localized osteoarthrosis, lower leg 09/06/2020   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   Chronic asthmatic bronchitis with acute exacerbation ([place]) 07/16/2014   Pain in lower limb 11/24/2013   S/P total knee arthroplasty 10/23/2013   Hyperlipidemia 10/08/2013   Constipation 10/08/2013   Essential hypertension, benign 10/08/2013   Hiccups 10/08/2013   Acute blood loss anemia 10/06/2013   OA (osteoarthritis) of knee 10/04/2013   Onychomycosis 10/30/2012   Pain in joint, ankle and foot 10/30/2012   Allergic rhinitis due to pollen 09/05/2010   Vitamin D deficiency 03/03/2009   Obstructive sleep apnea 04/16/2007   Allergic-infective asthma 04/16/2007   COUGH 04/16/2007   History of colonic polyps 08/06/2005   Past Surgical History:  Procedure Laterality Date   COLONOSCOPY W/ POLYPECTOMY     none      TOTAL KNEE ARTHROPLASTY Right 10/04/2013   Procedure: RIGHT TOTAL KNEE ARTHROPLASTY;  Surgeon: Gearlean Alf, MD;  Location: [facility];  Service: Orthopedics;  Laterality: Right;    Home Medications    Prior to Admission medications   Medication Sig Start Date End Date Taking? Authorizing Provider  acetaminophen (TYLENOL) 325 MG tablet TAKE TWO TABLETS BY MOUTH DAILY AS NEEDED FOR PAIN 09/05/21   [provider]  acetaminophen (TYLENOL) 650 MG CR tablet Take 650 mg by mouth every 8 (eight) hours as needed for pain.    [provider]  amLODipine (NORVASC) 10 MG tablet Take 10 mg by mouth daily. Patient not taking: Reported on 12/04/2021    [provider]  cetirizine (ZYRTEC) 5 MG tablet Take 1 tablet (5 mg total) by mouth at bedtime. 07/12/21 09/10/21  Lynden Oxford Scales, PA-C  chlorhexidine (PERIDEX) 0.12 % solution chlorhexidine gluconate 0.12 % mouthwash    [provider]  Cholecalciferol (VITAMIN D-3) 1000 units CAPS Take 1 capsule by mouth daily.     [provider]  Cholecalciferol 50 MCG (2000 UT) TABS Take 1 tablet by mouth daily. 09/06/21   [provider]  Coenzyme Q10 (CO Q 10 PO) Co Q-10 300 mg capsule  Take 1 capsule every day by oral route.    [provider]  furosemide (LASIX) 20 MG tablet Take 1 tablet by mouth daily. 08/23/20   [provider]  gabapentin (  NEURONTIN) 100 MG capsule TAKE TWO CAPSULES BY MOUTH AT BEDTIME FOR BACK PAIN 10/12/21   [provider]  gabapentin (NEURONTIN) 300 MG capsule Take 1 capsule (300 mg total) by mouth at bedtime. 08/08/21   Mcarthur Rossetti, MD  hydrALAZINE (APRESOLINE) 25 MG tablet Take 50 mg by mouth 2 (two) times daily. 03/10/20   [provider]  lidocaine (LIDODERM) 5 % Place 1 patch onto the skin daily. Remove & Discard patch within 12 hours or as directed by MD 09/03/21   Malvin Johns, MD  losartan (COZAAR) 100 MG tablet Take 100 mg by mouth every  morning.    [provider]  methylPREDNISolone (MEDROL DOSEPAK) 4 MG TBPK tablet Take 24 mg on day 1, 20 mg on day 2, 16 mg on day 3, 12 mg on day 4, 8 mg on day 5, 4 mg on day 6. 07/12/21   Lynden Oxford Scales, PA-C  metoprolol succinate (TOPROL-XL) 25 MG 24 hr tablet Take 12.5 mg by mouth daily.     [provider]  mometasone (NASONEX) 50 MCG/ACT nasal spray 1-2 sprays each nostril daily at bedtime while needed Patient taking differently: Place 1-2 sprays into the nose at bedtime as needed (allergies). 08/12/16   Baird Lyons D, MD  Omega 3 1000 MG CAPS Take 1 capsule by mouth.     [provider]  rosuvastatin (CRESTOR) 20 MG tablet Take 10 mg by mouth every evening.     [provider]  vitamin B-12 (CYANOCOBALAMIN) 100 MCG tablet Take 100 mcg by mouth daily.    [provider]    Family History Family History  Problem Relation Age of Onset   Cancer Father        cause of death   Colon cancer Mother        cause of death   Social History Social History   Tobacco Use   Smoking status: Former    Packs/day: 0.20    Years: 15.00    Total pack years: 3.00    Types: Cigarettes    Quit date: 07/01/1968    Years since quitting: 53.6   Smokeless tobacco: Former   Tobacco comments:    Quit 50 yrs ago as of 2014  Vaping Use   Vaping Use: Never used  Substance Use Topics   Alcohol use: No    Alcohol/week: 3.0 standard drinks of alcohol    Types: 3 Cans of beer per week   Drug use: No   Allergies   Fosinopril  Review of Systems Review of Systems Pertinent findings revealed after performing a 14 point review of systems has been noted in the history of present illness.  Physical Exam Triage Vital Signs ED Triage Vitals  Enc Vitals Group     BP 04/27/21 0827 (!) 147/82     Pulse Rate 04/27/21 0827 72     Resp 04/27/21 0827 18     Temp 04/27/21 0827 98.3 F (36.8 C)     Temp Source 04/27/21 0827 Oral     SpO2 04/27/21 0827 98  %     Weight --      Height --      Head Circumference --      Peak Flow --      Pain Score 04/27/21 0826 5     Pain Loc --      Pain Edu? --      Excl. in [place]? --   No data found.  Updated Vital Signs There were no vitals taken for this visit.  Physical Exam Vitals and nursing note reviewed.  Constitutional:      General: He is not in acute distress.    Appearance: Normal appearance. He is not ill-appearing.  HENT:     Head: Normocephalic and atraumatic.     Salivary Glands: Right salivary gland is not diffusely enlarged or tender. Left salivary gland is not diffusely enlarged or tender.     Right Ear: Tympanic membrane, ear canal and external ear normal. No drainage. No middle ear effusion. There is no impacted cerumen. Tympanic membrane is not erythematous or bulging.     Left Ear: Tympanic membrane, ear canal and external ear normal. No drainage.  No middle ear effusion. There is no impacted cerumen. Tympanic membrane is not erythematous or bulging.     Nose: Nose normal. No nasal deformity, septal deviation, mucosal edema, congestion or rhinorrhea.     Right Turbinates: Not enlarged, swollen or pale.     Left Turbinates: Not enlarged, swollen or pale.     Right Sinus: No maxillary sinus tenderness or frontal sinus tenderness.     Left Sinus: No maxillary sinus tenderness or frontal sinus tenderness.     Mouth/Throat:     Lips: Pink. No lesions.     Mouth: Mucous membranes are moist. No oral lesions.     Pharynx: Oropharynx is clear. Uvula midline. No posterior oropharyngeal erythema or uvula swelling.     Tonsils: No tonsillar exudate. 0 on the right. 0 on the left.  Eyes:     General: Lids are normal.        Right eye: No discharge.        Left eye: No discharge.     Extraocular Movements: Extraocular movements intact.     Conjunctiva/sclera: Conjunctivae normal.     Right eye: Right conjunctiva is not injected.     Left eye: Left conjunctiva is not injected.  Neck:      Trachea: Trachea and phonation normal.  Cardiovascular:     Rate and Rhythm: Normal rate and regular rhythm.     Pulses: Normal pulses.     Heart sounds: Normal heart sounds. No murmur heard.    No friction rub. No gallop.  Pulmonary:     Effort: Pulmonary effort is normal. No accessory muscle usage, prolonged expiration or respiratory distress.     Breath sounds: Normal breath sounds. No stridor, decreased air movement or transmitted upper airway sounds. No decreased breath sounds, wheezing, rhonchi or rales.  Chest:     Chest wall: No tenderness.  Musculoskeletal:        General: Normal range of motion.     Cervical back: Normal range of motion and neck supple. Normal range of motion.  Lymphadenopathy:     Cervical: No cervical adenopathy.  Skin:    General: Skin is warm and dry.     Findings: No erythema or rash.  Neurological:     General: No focal deficit present.     Mental Status: He is alert and oriented to person, place, and time.  Psychiatric:        Mood and Affect: Mood normal.        Behavior: Behavior normal.     Visual Acuity Right Eye Distance:   Left Eye Distance:   Bilateral Distance:    Right Eye Near:   Left Eye Near:    Bilateral Near:     UC Couse /  Diagnostics / Procedures:     Radiology No results found.  Procedures Procedures (including critical care time) EKG  Pending results:  Labs Reviewed  NOVEL CORONAVIRUS, NAA    Medications Ordered in UC: Medications - No data to display  UC Diagnoses / Final Clinical Impressions(s)   I have reviewed the triage vital signs and the nursing notes.  Pertinent labs & imaging results that were available during my care of the patient were reviewed by me and considered in my medical decision making (see chart for details).    Final diagnoses:  Sore throat   COVID-19 testing performed at patient's request.  Patient advised to hoarseness of his voice more likely due to seasonal allergies.  Patient  advised that if his COVID-19 test is positive we will be happy to provide him with prescription for molnupiravir given his kidney disease and contraindication to Paxlovid.  ED Prescriptions   None    PDMP not reviewed this encounter.  Disposition Upon Discharge:  Condition: stable for discharge home Home: take medications as prescribed; routine discharge instructions as discussed; follow up as advised.  Patient presented with an acute illness with associated systemic symptoms and significant discomfort requiring urgent management. In my opinion, this is a condition that a prudent lay person (someone who possesses an average knowledge of health and medicine) may potentially expect to result in complications if not addressed urgently such as respiratory distress, impairment of bodily function or dysfunction of bodily organs.   Routine symptom specific, illness specific and/or disease specific instructions were discussed with the patient and/or caregiver at length.   As such, the patient has been evaluated and assessed, work-up was performed and treatment was provided in alignment with urgent care protocols and evidence based medicine.  Patient/parent/caregiver has been advised that the patient may require follow up for further testing and treatment if the symptoms continue in spite of treatment, as clinically indicated and appropriate.  If the patient was tested for COVID-19, Influenza and/or RSV, then the patient/parent/guardian was advised to isolate at home pending the results of his/her diagnostic coronavirus test and potentially longer if they're positive. I have also advised pt that if his/her COVID-19 test returns positive, it's recommended to self-isolate for at least 10 days after symptoms first appeared AND until fever-free for 24 hours without fever reducer AND other symptoms have improved or resolved. Discussed self-isolation recommendations as well as instructions for household  member/close contacts as per the [facility] and [place] DHHS, and also gave patient the [place] packet with this information.  Patient/parent/caregiver has been advised to return to the [facility] or PCP in 3-5 days if no better; to PCP or the Emergency Department if new signs and symptoms develop, or if the current signs or symptoms continue to change or worsen for further workup, evaluation and treatment as clinically indicated and appropriate  The patient will follow up with their current PCP if and as advised. If the patient does not currently have a PCP we will assist them in obtaining one.   The patient may need specialty follow up if the symptoms continue, in spite of conservative treatment and management, for further workup, evaluation, consultation and treatment as clinically indicated and appropriate.  Patient/parent/caregiver verbalized understanding and agreement of plan as discussed.  All questions were addressed during visit.  Please see discharge instructions below for further details of plan.  Discharge Instructions: Discharge Instructions   None     This office note has been dictated using Dragon speech recognition  software.  Unfortunately, this method of dictation can sometimes lead to typographical or grammatical errors.  I apologize for your inconvenience in advance if this occurs.  Please do not hesitate to reach out to me if clarification is needed.      Lynden Oxford Scales, PA-C 01/25/22 1044

## 2022-01-25 NOTE — ED Triage Notes (Signed)
Patient c/o hoarseness since labor day.

## 2022-01-26 LAB — NOVEL CORONAVIRUS, NAA: SARS-CoV-2, NAA: NOT DETECTED

## 2022-03-08 ENCOUNTER — Encounter: Payer: Self-pay | Admitting: Podiatry

## 2022-03-08 ENCOUNTER — Ambulatory Visit (INDEPENDENT_AMBULATORY_CARE_PROVIDER_SITE_OTHER): Payer: Medicare Other | Admitting: Podiatry

## 2022-03-08 DIAGNOSIS — N183 Chronic kidney disease, stage 3 unspecified: Secondary | ICD-10-CM | POA: Diagnosis not present

## 2022-03-08 DIAGNOSIS — B351 Tinea unguium: Secondary | ICD-10-CM

## 2022-03-08 NOTE — Progress Notes (Signed)
This patient returns to the office for evaluation and treatment of long thick painful nails .  This patient is unable to trim his own nails since the patient cannot reach his feet.  Patient says the nails are painful walking and wearing his shoes.  He returns for preventive foot care services.  General Appearance  Alert, conversant and in no acute stress.  Vascular  Dorsalis pedis and posterior tibial  pulses are weakly  palpable  bilaterally.  Capillary return is within normal limits  bilaterally. Temperature is within normal limits  bilaterally.  Neurologic  Senn-Weinstein monofilament wire test within normal limits  bilaterally. Muscle power within normal limits bilaterally.  Nails Thick disfigured discolored nails with subungual debris  from hallux to fifth toes bilaterally. No evidence of bacterial infection or drainage bilaterally.  Orthopedic  No limitations of motion  feet .  No crepitus or effusions noted.  No bony pathology or digital deformities noted.  HAV  B/L.  Skin  normotropic skin with no porokeratosis noted bilaterally.  No signs of infections or ulcers noted.     Onychomycosis  Pain in toes right foot  Pain in toes left foot  Debridement  of nails  1-5  B/L with a nail nipper.  Nails were then filed using a dremel tool with no incidents.    RTC 12 weeks   Gardiner Barefoot DPM

## 2022-04-26 ENCOUNTER — Emergency Department (HOSPITAL_COMMUNITY): Payer: No Typology Code available for payment source

## 2022-04-26 ENCOUNTER — Emergency Department (HOSPITAL_COMMUNITY)
Admission: EM | Admit: 2022-04-26 | Discharge: 2022-04-26 | Disposition: A | Discharge status: Home / Self Care | Payer: No Typology Code available for payment source | Attending: Emergency Medicine | Admitting: Emergency Medicine

## 2022-04-26 ENCOUNTER — Encounter (HOSPITAL_COMMUNITY): Payer: Self-pay

## 2022-04-26 DIAGNOSIS — N189 Chronic kidney disease, unspecified: Secondary | ICD-10-CM | POA: Insufficient documentation

## 2022-04-26 DIAGNOSIS — Z79899 Other long term (current) drug therapy: Secondary | ICD-10-CM | POA: Insufficient documentation

## 2022-04-26 DIAGNOSIS — R600 Localized edema: Secondary | ICD-10-CM | POA: Insufficient documentation

## 2022-04-26 DIAGNOSIS — M79672 Pain in left foot: Secondary | ICD-10-CM | POA: Diagnosis present

## 2022-04-26 DIAGNOSIS — J45909 Unspecified asthma, uncomplicated: Secondary | ICD-10-CM | POA: Insufficient documentation

## 2022-04-26 DIAGNOSIS — D696 Thrombocytopenia, unspecified: Secondary | ICD-10-CM | POA: Diagnosis not present

## 2022-04-26 DIAGNOSIS — I129 Hypertensive chronic kidney disease with stage 1 through stage 4 chronic kidney disease, or unspecified chronic kidney disease: Secondary | ICD-10-CM | POA: Insufficient documentation

## 2022-04-26 DIAGNOSIS — D649 Anemia, unspecified: Secondary | ICD-10-CM | POA: Diagnosis not present

## 2022-04-26 DIAGNOSIS — G609 Hereditary and idiopathic neuropathy, unspecified: Secondary | ICD-10-CM | POA: Insufficient documentation

## 2022-04-26 DIAGNOSIS — G629 Polyneuropathy, unspecified: Secondary | ICD-10-CM

## 2022-04-26 LAB — COMPREHENSIVE METABOLIC PANEL
ALT: 20 U/L (ref 0–44)
AST: 18 U/L (ref 15–41)
Albumin: 3.3 g/dL — ABNORMAL LOW (ref 3.5–5.0)
Alkaline Phosphatase: 44 U/L (ref 38–126)
Anion gap: 6 (ref 5–15)
BUN: 17 mg/dL (ref 8–23)
CO2: 27 mmol/L (ref 22–32)
Calcium: 8.6 mg/dL — ABNORMAL LOW (ref 8.9–10.3)
Chloride: 108 mmol/L (ref 98–111)
Creatinine, Ser: 1.43 mg/dL — ABNORMAL HIGH (ref 0.61–1.24)
GFR, Estimated: 46 mL/min — ABNORMAL LOW (ref >60)
Glucose, Bld: 102 mg/dL — ABNORMAL HIGH (ref 70–99)
Potassium: 3.7 mmol/L (ref 3.5–5.1)
Sodium: 141 mmol/L (ref 135–145)
Total Bilirubin: 1 mg/dL (ref 0.3–1.2)
Total Protein: 6.5 g/dL (ref 6.5–8.1)

## 2022-04-26 LAB — URINALYSIS, ROUTINE W REFLEX MICROSCOPIC
Bilirubin Urine: NEGATIVE
Glucose, UA: NEGATIVE mg/dL
Hgb urine dipstick: NEGATIVE
Ketones, ur: NEGATIVE mg/dL
Leukocytes,Ua: NEGATIVE
Nitrite: NEGATIVE
Protein, ur: NEGATIVE mg/dL
Specific Gravity, Urine: 1.009 (ref 1.005–1.030)
pH: 7 (ref 5.0–8.0)

## 2022-04-26 LAB — CBC WITH DIFFERENTIAL/PLATELET
Abs Immature Granulocytes: 0 10*3/uL (ref 0.00–0.07)
Basophils Absolute: 0 10*3/uL (ref 0.0–0.1)
Basophils Relative: 1 %
Eosinophils Absolute: 0.1 10*3/uL (ref 0.0–0.5)
Eosinophils Relative: 3 %
HCT: 33.3 % — ABNORMAL LOW (ref 39.0–52.0)
Hemoglobin: 10.5 g/dL — ABNORMAL LOW (ref 13.0–17.0)
Immature Granulocytes: 0 %
Lymphocytes Relative: 26 %
Lymphs Abs: 1.2 10*3/uL (ref 0.7–4.0)
MCH: 28.6 pg (ref 26.0–34.0)
MCHC: 31.5 g/dL (ref 30.0–36.0)
MCV: 90.7 fL (ref 80.0–100.0)
Monocytes Absolute: 0.4 10*3/uL (ref 0.1–1.0)
Monocytes Relative: 10 %
Neutro Abs: 2.7 10*3/uL (ref 1.7–7.7)
Neutrophils Relative %: 60 %
Platelets: 130 10*3/uL — ABNORMAL LOW (ref 150–400)
RBC: 3.67 MIL/uL — ABNORMAL LOW (ref 4.22–5.81)
RDW: 13.6 % (ref 11.5–15.5)
WBC: 4.4 10*3/uL (ref 4.0–10.5)
nRBC: 0 % (ref 0.0–0.2)

## 2022-04-26 LAB — TROPONIN I (HIGH SENSITIVITY)
Troponin I (High Sensitivity): 24 ng/L — ABNORMAL HIGH (ref <18)
Troponin I (High Sensitivity): 20 ng/L — ABNORMAL HIGH (ref <18)

## 2022-04-26 LAB — LIPASE, BLOOD: Lipase: 31 U/L (ref 11–51)

## 2022-04-26 LAB — BRAIN NATRIURETIC PEPTIDE: B Natriuretic Peptide: 311.2 pg/mL — ABNORMAL HIGH (ref 0.0–100.0)

## 2022-04-26 LAB — MAGNESIUM: Magnesium: 2.1 mg/dL (ref 1.7–2.4)

## 2022-04-26 MED ORDER — GABAPENTIN 100 MG PO CAPS
100.0000 mg | ORAL_CAPSULE | Freq: Once | ORAL | Status: DC
  Filled 2022-04-26: qty 1

## 2022-04-26 MED ORDER — DICLOFENAC SODIUM 1 % EX GEL
2.0000 g | Freq: Once | CUTANEOUS | Status: AC
  Administered 2022-04-26: 2 g via TOPICAL
  Filled 2022-04-26: qty 50
  Filled 2022-04-26: qty 100

## 2022-04-26 MED ORDER — FUROSEMIDE 20 MG PO TABS: 20.0000 mg | ORAL_TABLET | Freq: Every day | ORAL | 0 refills | Status: DC

## 2022-04-26 MED ORDER — FUROSEMIDE 40 MG PO TABS
20.0000 mg | ORAL_TABLET | Freq: Once | ORAL | Status: AC
  Administered 2022-04-26: 20 mg via ORAL
  Filled 2022-04-26: qty 1

## 2022-04-26 MED ORDER — ACETAMINOPHEN 500 MG PO TABS
1000.0000 mg | ORAL_TABLET | Freq: Once | ORAL | Status: AC
  Administered 2022-04-26: 1000 mg via ORAL
  Filled 2022-04-26: qty 2

## 2022-04-26 NOTE — ED Triage Notes (Signed)
Pt arrived via POV, c/o bilateral leg pain and swelling as well as back pain.

## 2022-04-26 NOTE — ED Provider Notes (Signed)
[facility] DEPT Provider Note   CSN: [id number] Arrival date & time: 04/26/22  1038     History  Chief Complaint  Patient presents with   Back Pain   Leg Pain    Shaydon Lease is a 88 y.o. male.  With PMH of HTN, HLD, anemia, asthma, OSA, CKD who presents with multiple chronic issues but main complaint is increased burning pain in bilateral feet.  Patient is here for increased burning pain in bilateral feet.  He said he was diagnosed with neuropathy with no history of diabetes and has been taking gabapentin which helps with his symptoms but when he was taking too much got too sleepy.  He denies any recent injury or falls or change of color of the skin of the feet, skin breakdown or any skin changes.  He has had no fevers or other infectious symptoms.  He also notes having chronic pain in his right posterior shoulder.  No recent injuries, numbness or tingling or loss of sensation.  He has no active chest pain or abdominal pain, no nausea or vomiting, no shortness of breath, no recent cough or URI symptoms.  However, he notes sometimes having some dyspepsia and discomfort when swallowing.  He also notes some increased swelling in bilateral feet no swelling in the legs.  He uses compression stockings and elevates his legs to help with this.  No history of heart failure that he is aware of.  He says with his age he just wanted to be checked out due to all the ongoing symptoms to make sure everything was still working okay.   Back Pain Associated symptoms: leg pain   Leg Pain Associated symptoms: back pain        Home Medications Prior to Admission medications   Medication Sig Start Date End Date Taking? Authorizing Provider  furosemide (LASIX) 20 MG tablet Take 1 tablet (20 mg total) by mouth daily for 3 days. 04/26/22 04/29/22 Yes Elgie Congo, MD  acetaminophen (TYLENOL) 325 MG tablet TAKE TWO TABLETS BY MOUTH DAILY AS NEEDED FOR PAIN 09/05/21    [provider]  acetaminophen (TYLENOL) 650 MG CR tablet Take 650 mg by mouth every 8 (eight) hours as needed for pain.    [provider]  amLODipine (NORVASC) 10 MG tablet Take 10 mg by mouth daily. Patient not taking: Reported on 12/04/2021    [provider]  cetirizine (ZYRTEC) 5 MG tablet Take 1 tablet (5 mg total) by mouth at bedtime. 07/12/21 09/10/21  Lynden Oxford Scales, PA-C  chlorhexidine (PERIDEX) 0.12 % solution chlorhexidine gluconate 0.12 % mouthwash    [provider]  Cholecalciferol (VITAMIN D-3) 1000 units CAPS Take 1 capsule by mouth daily.     [provider]  Cholecalciferol 50 MCG (2000 UT) TABS Take 1 tablet by mouth daily. 09/06/21   [provider]  Coenzyme Q10 (CO Q 10 PO) Co Q-10 300 mg capsule  Take 1 capsule every day by oral route.    [provider]  furosemide (LASIX) 20 MG tablet Take 1 tablet by mouth daily. 08/23/20   [provider]  gabapentin (NEURONTIN) 100 MG capsule TAKE TWO CAPSULES BY MOUTH AT BEDTIME FOR BACK PAIN 10/12/21   [provider]  gabapentin (NEURONTIN) 300 MG capsule Take 1 capsule (300 mg total) by mouth at bedtime. 08/08/21   Mcarthur Rossetti, MD  hydrALAZINE (APRESOLINE) 25 MG tablet Take 50 mg by mouth 2 (two) times daily. 03/10/20  [provider]  lidocaine (LIDODERM) 5 % Place 1 patch onto the skin daily. Remove & Discard patch within 12 hours or as directed by MD 09/03/21   Malvin Johns, MD  losartan (COZAAR) 100 MG tablet Take 100 mg by mouth every morning.    [provider]  metoprolol succinate (TOPROL-XL) 25 MG 24 hr tablet Take 12.5 mg by mouth daily.     [provider]  mometasone (NASONEX) 50 MCG/ACT nasal spray 1-2 sprays each nostril daily at bedtime while needed Patient taking differently: Place 1-2 sprays into the nose at bedtime as needed (allergies). 08/12/16   Baird Lyons D, MD  Omega 3 1000 MG CAPS Take 1  capsule by mouth.     [provider]  rosuvastatin (CRESTOR) 20 MG tablet Take 10 mg by mouth every evening.     [provider]  vitamin B-12 (CYANOCOBALAMIN) 100 MCG tablet Take 100 mcg by mouth daily.    [provider]      Allergies    Fosinopril    Review of Systems   Review of Systems  Musculoskeletal:  Positive for back pain.    Physical Exam Updated Vital Signs BP (!) 145/68   Pulse (!) 51   Temp 98.2 F (36.8 C) (Oral)   Resp 13   SpO2 98%  Physical Exam Constitutional: Alert and oriented. Well appearing and in no distress. Eyes: Conjunctivae are normal. ENT      Head: Normocephalic and atraumatic.      Nose: No congestion.      Mouth/Throat: Mucous membranes are moist.      Neck: No stridor. Cardiovascular: S1, S2,  RRR, Normal and symmetric distal pulses are present in all extremities.Warm and well perfused. Respiratory: Normal respiratory effort. Breath sounds are normal.  O2 sat 100 on RA Gastrointestinal: Soft and nontender.  Musculoskeletal: Normal range of motion in all extremities.  Mild tenderness of right posterior trapezoid region, full strength and sensation intact of right shoulder no external evidence of injury. Mild equal edema of bilateral feet, no erythema, no warmth, no discharge, no lesions.  Full strength and sensation intact in bilateral legs and feet. Neurologic:Normal speech and language without aphasia. AAOx4.  EOMI. Face symmetrical without droop. 5/5 strength in upper and lower extremities. Normal sensation to light touch in all extremities. Normal gait. No focal neurologic deficits are appreciated. Skin: Skin is warm, dry and intact. No rash noted. Psychiatric: Mood and affect are normal. Speech and behavior are normal.  ED Results / Procedures / Treatments   Labs (all labs ordered are listed, but only abnormal results are displayed) Labs Reviewed  COMPREHENSIVE METABOLIC PANEL - Abnormal; Notable for the  following components:      Result Value   Glucose, Bld 102 (*)    Creatinine, Ser 1.43 (*)    Calcium 8.6 (*)    Albumin 3.3 (*)    GFR, Estimated 46 (*)    All other components within normal limits  CBC WITH DIFFERENTIAL/PLATELET - Abnormal; Notable for the following components:   RBC 3.67 (*)    Hemoglobin 10.5 (*)    HCT 33.3 (*)    Platelets 130 (*)    All other components within normal limits  BRAIN NATRIURETIC PEPTIDE - Abnormal; Notable for the following components:   B Natriuretic Peptide 311.2 (*)    All other components within normal limits  TROPONIN I (HIGH SENSITIVITY) - Abnormal; Notable for the following components:   Troponin I (High  Sensitivity) 24 (*)    All other components within normal limits  TROPONIN I (HIGH SENSITIVITY) - Abnormal; Notable for the following components:   Troponin I (High Sensitivity) 20 (*)    All other components within normal limits  URINALYSIS, ROUTINE W REFLEX MICROSCOPIC  MAGNESIUM  LIPASE, BLOOD    EKG EKG Interpretation  Date/Time:  Friday April 26 2022 11:23:29 EDT Ventricular Rate:  58 PR Interval:  217 QRS Duration: 97 QT Interval:  457 QTC Calculation: 449 R Axis:   5 Text Interpretation: Sinus rhythm Atrial premature complexes Borderline prolonged PR interval Confirmed by Georgina Snell [phone]) on 04/26/2022 11:46:53 AM  Radiology DG Chest 2 View  Result Date: 04/26/2022 CLINICAL DATA:  Right shoulder and back pain EXAM: CHEST - 2 VIEW COMPARISON:  Chest radiograph dated 02/03/2021, CT chest dated 08/31/2020 FINDINGS: Normal lung volumes. No focal consolidations. No pleural effusion or pneumothorax. Similar mildly enlarged cardiomediastinal silhouette. Similar diaphragmatic eventration/hernia at the posteromedial left hemidiaphragm. The visualized skeletal structures are unremarkable. IMPRESSION: 1. No active cardiopulmonary disease. 2. Similar mildly enlarged cardiomediastinal silhouette. Electronically Signed   By:  Darrin Nipper M.D.   On: 04/26/2022 12:12    Procedures Procedures    Medications Ordered in ED Medications  gabapentin (NEURONTIN) capsule 100 mg (100 mg Oral Patient Refused/Not Given 04/26/22 1131)  acetaminophen (TYLENOL) tablet 1,000 mg (1,000 mg Oral Given 04/26/22 1306)  diclofenac Sodium (VOLTAREN) 1 % topical gel 2 g (2 g Topical Given 04/26/22 1241)  furosemide (LASIX) tablet 20 mg (20 mg Oral Given 04/26/22 1407)    ED Course/ Medical Decision Making/ A&P                           Medical Decision Making Amitai Delaughter is a 88 y.o. male.  With PMH of HTN, HLD, anemia, asthma, OSA, CKD who presents with multiple chronic issues but main complaint is increased burning pain in bilateral feet.  Regarding patient's bilateral feet pain which has been chronic in nature, suspect nonspecific musculoskeletal pain or neuropathic consistent with known neuropathy.  He has palpable pulses bilaterally no concern for ischemic limb or dissection with no tearing or ripping chest pain, well appearance focal neurologic or pulse deficits with no mediastinal widening on chest x-ray.  He has no signs of cellulitis on exam.  He has had no recent falls or injuries.  Considered metabolic issue such as acute electrolyte abnormalities contributing to burning pain Labs obtained and reviewed which showed creatinine 1.43 improved from baseline with no acute electrolyte abnormalities sodium 141 potassium 3.7.  Anemia hemoglobin 10.5 and mild thrombocytopenia 130 consistent with prior labs.  Regarding patient's chronic right trapezius pain, no recent injuries likely nonspecific MSK strain or pain.  He is neurovascularly intact.  Chest x-ray obtained which I personally reviewed no consolidation concerning for pneumonia, no evidence of fractures or dislocation, no pneumothorax no pleural effusion.  Based on age considered atypical ACS although highly unlikely due to chronicity of issue and no concerning associated  symptoms and no active chest pain.  EKG reviewed sinus rhythm with no ST/T changes c/f ischemia. Troponin 24 and repeat 20 improved from baseline not concerned for ACS with no active chest pain and no changes on EKG.  BNP 311 slightly elevated possibly due to underlying mild heart failure however no pulmonary edema no increased work of breathing, will give dose of p.o. Lasix 20 mg in the ED.  Patient was given Tylenol, Voltaren  gel, and Lasix in ED.  He is currently asymptomatic and feeling well, discharging with Voltaren gel prescription and advised continued home medication gabapentin with Tylenol and 3 days of Lasix 20 mg to help with swelling.  Advise follow-up with PCP and strict return precaution discussed.  He is in agreement with plan safe for discharge.  Amount and/or Complexity of Data Reviewed Labs: ordered. Radiology: ordered.  Risk OTC drugs. Prescription drug management.    Final Clinical Impression(s) / ED Diagnoses Final diagnoses:  Foot pain, bilateral  Neuropathy  Edema of foot    Rx / DC Orders ED Discharge Orders          Ordered    furosemide (LASIX) 20 MG tablet  Daily        04/26/22 1404              Elgie Congo, MD 04/26/22 1411

## 2022-04-26 NOTE — Discharge Instructions (Addendum)
You were seen for bilateral foot pain and shoulder/back pain in the ER today.  Your work-up including your physical exam, lab, EKG and chest x-ray were reassuring.  We do not think you have any concerning findings requiring admission to the hospital or further work-up at this time.  Your kidney function is improved from your normal.  Your chest x-ray showed no pneumonia or fluid on the lungs.  Continue using your gabapentin for neuropathy gram every 6-8 hours.  You can also continue using the Voltaren gel provided to you and take the Lasix the next 3 days to help with swelling in your feet.  Continue wearing compression stockings and elevate your legs when resting.  Follow-up with your doctor regarding your visit to the ER today.  Come back if any severe worsening or new chest pain, shortness of breath, fainting, weakness, or any other symptoms concerning to you.

## 2022-04-27 ENCOUNTER — Encounter: Payer: Self-pay | Admitting: *Deleted

## 2022-04-27 ENCOUNTER — Ambulatory Visit
Admission: EM | Admit: 2022-04-27 | Discharge: 2022-04-27 | Disposition: A | Discharge status: Home / Self Care | Payer: Medicare Other | Attending: Urgent Care | Admitting: Urgent Care

## 2022-04-27 DIAGNOSIS — Z20822 Contact with and (suspected) exposure to covid-19: Secondary | ICD-10-CM | POA: Insufficient documentation

## 2022-04-27 DIAGNOSIS — Z1152 Encounter for screening for COVID-19: Secondary | ICD-10-CM | POA: Diagnosis present

## 2022-04-27 HISTORY — DX: Localized edema: R60.0

## 2022-04-27 LAB — SARS CORONAVIRUS 2 (TAT 6-24 HRS): SARS Coronavirus 2: NEGATIVE

## 2022-04-27 NOTE — ED Triage Notes (Signed)
Pt denies any sxs. States has been exposed to spouse, who was exposed to Covid. Pt reports being in ED yesterday for extremity swelling, but states he got all taken care of.

## 2022-04-27 NOTE — ED Provider Notes (Signed)
[facility]  Note:  This document was prepared using Systems analyst and may include unintentional dictation errors.  MRN: [id number] DOB: Apr 21, 1927  Subjective:   Christopher Shepherd is a 88 y.o. male presenting for COVID screening.  Patient had indirect exposure through his wife.  She did not test positive but was exposed to somebody who tested positive for COVID-19.  Patient denies fever, chest pain, shortness of breath or wheezing.  He was seen yesterday through the emergency room for lower leg swelling and is currently undergoing treatment for that.  He would like COVID antiviral medication should he test positive for COVID-19.  No current facility-administered medications for this encounter.  Current Outpatient Medications:    Cholecalciferol (VITAMIN D-3) 1000 units CAPS, Take 1 capsule by mouth daily. , Disp: , Rfl:    furosemide (LASIX) 20 MG tablet, Take 1 tablet by mouth daily., Disp: , Rfl:    gabapentin (NEURONTIN) 100 MG capsule, TAKE TWO CAPSULES BY MOUTH AT BEDTIME FOR BACK PAIN, Disp: , Rfl:    hydrALAZINE (APRESOLINE) 25 MG tablet, Take 50 mg by mouth 2 (two) times daily., Disp: , Rfl:    losartan (COZAAR) 100 MG tablet, Take 100 mg by mouth every morning., Disp: , Rfl:    metoprolol succinate (TOPROL-XL) 25 MG 24 hr tablet, Take 12.5 mg by mouth daily. , Disp: , Rfl:    Omega 3 1000 MG CAPS, Take 1 capsule by mouth. , Disp: , Rfl:    rosuvastatin (CRESTOR) 20 MG tablet, Take 10 mg by mouth every evening. , Disp: , Rfl:    vitamin B-12 (CYANOCOBALAMIN) 100 MCG tablet, Take 100 mcg by mouth daily., Disp: , Rfl:    acetaminophen (TYLENOL) 325 MG tablet, TAKE TWO TABLETS BY MOUTH DAILY AS NEEDED FOR PAIN, Disp: , Rfl:    acetaminophen (TYLENOL) 650 MG CR tablet, Take 650 mg by mouth every 8 (eight) hours as needed for pain., Disp: , Rfl:    amLODipine (NORVASC) 10 MG tablet, Take 10 mg by mouth daily. (Patient not taking: Reported on  12/04/2021), Disp: , Rfl:    chlorhexidine (PERIDEX) 0.12 % solution, chlorhexidine gluconate 0.12 % mouthwash, Disp: , Rfl:    Cholecalciferol 50 MCG (2000 UT) TABS, Take 1 tablet by mouth daily., Disp: , Rfl:    furosemide (LASIX) 20 MG tablet, Take 1 tablet (20 mg total) by mouth daily for 3 days., Disp: 3 tablet, Rfl: 0   lidocaine (LIDODERM) 5 %, Place 1 patch onto the skin daily. Remove & Discard patch within 12 hours or as directed by MD, Disp: 30 patch, Rfl: 0   Allergies  Allergen Reactions   Fosinopril Cough    Past Medical History:  Diagnosis Date   Allergic rhinitis    Anemia    Arthritis    osteoarthritis   Asthma 1994   with bronchitis   Cough    Extremity edema    Hyperlipidemia    Hypertension    Obstructive sleep apnea    uses a C-Pap     Past Surgical History:  Procedure Laterality Date   COLONOSCOPY W/ POLYPECTOMY     TOTAL KNEE ARTHROPLASTY Right 10/04/2013   Procedure: RIGHT TOTAL KNEE ARTHROPLASTY;  Surgeon: Gearlean Alf, MD;  Location: [facility];  Service: Orthopedics;  Laterality: Right;    Family History  Problem Relation Age of Onset   Cancer Father        cause of death   Colon  cancer Mother        cause of death    Social History   Tobacco Use   Smoking status: Former    Packs/day: 0.20    Years: 15.00    Total pack years: 3.00    Types: Cigarettes    Quit date: 07/01/1968    Years since quitting: 53.8   Smokeless tobacco: Former   Tobacco comments:    Quit 50 yrs ago as of 2014  Vaping Use   Vaping Use: Never used  Substance Use Topics   Alcohol use: No    Alcohol/week: 3.0 standard drinks of alcohol    Types: 3 Cans of beer per week   Drug use: No    ROS   Objective:   Vitals: BP 130/60   Pulse (!) 56   Temp 98.2 F (36.8 C) (Oral)   Resp 20   SpO2 97%   Physical Exam Constitutional:      General: He is not in acute distress.    Appearance: Normal appearance. He is well-developed and normal weight. He is not  ill-appearing, toxic-appearing or diaphoretic.  HENT:     Head: Normocephalic and atraumatic.     Right Ear: External ear normal.     Left Ear: External ear normal.     Nose: Nose normal.     Mouth/Throat:     Mouth: Mucous membranes are moist.  Eyes:     General: No scleral icterus.       Right eye: No discharge.        Left eye: No discharge.     Extraocular Movements: Extraocular movements intact.  Cardiovascular:     Rate and Rhythm: Normal rate and regular rhythm.     Heart sounds: Normal heart sounds. No murmur heard.    No friction rub. No gallop.  Pulmonary:     Effort: Pulmonary effort is normal. No respiratory distress.     Breath sounds: Normal breath sounds. No stridor. No wheezing, rhonchi or rales.  Musculoskeletal:     Cervical back: Normal range of motion.  Neurological:     Mental Status: He is alert and oriented to person, place, and time.  Psychiatric:        Mood and Affect: Mood normal.        Behavior: Behavior normal.        Thought Content: Thought content normal.        Judgment: Judgment normal.     Results for orders placed or performed during the hospital encounter of 04/26/22 (from the past 24 hour(s))  Comprehensive metabolic panel     Status: Abnormal   Collection Time: 04/26/22 11:33 AM  Result Value Ref Range   Sodium 141 135 - 145 mmol/L   Potassium 3.7 3.5 - 5.1 mmol/L   Chloride 108 98 - 111 mmol/L   CO2 27 22 - 32 mmol/L   Glucose, Bld 102 (H) 70 - 99 mg/dL   BUN 17 8 - 23 mg/dL   Creatinine, Ser 1.43 (H) 0.61 - 1.24 mg/dL   Calcium 8.6 (L) 8.9 - 10.3 mg/dL   Total Protein 6.5 6.5 - 8.1 g/dL   Albumin 3.3 (L) 3.5 - 5.0 g/dL   AST 18 15 - 41 U/L   ALT 20 0 - 44 U/L   Alkaline Phosphatase 44 38 - 126 U/L   Total Bilirubin 1.0 0.3 - 1.2 mg/dL   GFR, Estimated 46 (L) >60 mL/min   Anion gap 6 5 -  15  CBC with Differential     Status: Abnormal   Collection Time: 04/26/22 11:33 AM  Result Value Ref Range   WBC 4.4 4.0 - 10.5 K/uL    RBC 3.67 (L) 4.22 - 5.81 MIL/uL   Hemoglobin 10.5 (L) 13.0 - 17.0 g/dL   HCT 33.3 (L) 39.0 - 52.0 %   MCV 90.7 80.0 - 100.0 fL   MCH 28.6 26.0 - 34.0 pg   MCHC 31.5 30.0 - 36.0 g/dL   RDW 13.6 11.5 - 15.5 %   Platelets 130 (L) 150 - 400 K/uL   nRBC 0.0 0.0 - 0.2 %   Neutrophils Relative % 60 %   Neutro Abs 2.7 1.7 - 7.7 K/uL   Lymphocytes Relative 26 %   Lymphs Abs 1.2 0.7 - 4.0 K/uL   Monocytes Relative 10 %   Monocytes Absolute 0.4 0.1 - 1.0 K/uL   Eosinophils Relative 3 %   Eosinophils Absolute 0.1 0.0 - 0.5 K/uL   Basophils Relative 1 %   Basophils Absolute 0.0 0.0 - 0.1 K/uL   Immature Granulocytes 0 %   Abs Immature Granulocytes 0.00 0.00 - 0.07 K/uL  Troponin I (High Sensitivity)     Status: Abnormal   Collection Time: 04/26/22 11:33 AM  Result Value Ref Range   Troponin I (High Sensitivity) 24 (H) <18 ng/L  Brain natriuretic peptide     Status: Abnormal   Collection Time: 04/26/22 11:33 AM  Result Value Ref Range   B Natriuretic Peptide 311.2 (H) 0.0 - 100.0 pg/mL  Magnesium     Status: None   Collection Time: 04/26/22 11:33 AM  Result Value Ref Range   Magnesium 2.1 1.7 - 2.4 mg/dL  Lipase, blood     Status: None   Collection Time: 04/26/22 11:33 AM  Result Value Ref Range   Lipase 31 11 - 51 U/L  Urinalysis, Routine w reflex microscopic     Status: None   Collection Time: 04/26/22  1:08 PM  Result Value Ref Range   Color, Urine YELLOW YELLOW   APPearance CLEAR CLEAR   Specific Gravity, Urine 1.009 1.005 - 1.030   pH 7.0 5.0 - 8.0   Glucose, UA NEGATIVE NEGATIVE mg/dL   Hgb urine dipstick NEGATIVE NEGATIVE   Bilirubin Urine NEGATIVE NEGATIVE   Ketones, ur NEGATIVE NEGATIVE mg/dL   Protein, ur NEGATIVE NEGATIVE mg/dL   Nitrite NEGATIVE NEGATIVE   Leukocytes,Ua NEGATIVE NEGATIVE  Troponin I (High Sensitivity)     Status: Abnormal   Collection Time: 04/26/22  1:10 PM  Result Value Ref Range   Troponin I (High Sensitivity) 20 (H) <18 ng/L   DG Chest 2  View  Result Date: 04/26/2022 CLINICAL DATA:  Right shoulder and back pain EXAM: CHEST - 2 VIEW COMPARISON:  Chest radiograph dated 02/03/2021, CT chest dated 08/31/2020 FINDINGS: Normal lung volumes. No focal consolidations. No pleural effusion or pneumothorax. Similar mildly enlarged cardiomediastinal silhouette. Similar diaphragmatic eventration/hernia at the posteromedial left hemidiaphragm. The visualized skeletal structures are unremarkable. IMPRESSION: 1. No active cardiopulmonary disease. 2. Similar mildly enlarged cardiomediastinal silhouette. Electronically Signed   By: Darrin Nipper M.D.   On: 04/26/2022 12:12    Assessment and Plan :   PDMP not reviewed this encounter.  1. Close exposure to COVID-19 virus     Patient would be an excellent candidate to take Paxlovid with the renal dosing, hold rosuvastatin.  Currently he is asymptomatic. Deferred imaging given clear cardiopulmonary exam, hemodynamically stable vital signs.  Labs pending.   Jaynee Eagles, PA-C 04/27/22 1238

## 2022-04-27 NOTE — Discharge Instructions (Signed)
We will notify you of your test results as they arrive and may take between about 24 hours.  I encourage you to sign up for MyChart if you have not already done so as this can be the easiest way for [place] to communicate results to you online or through a phone app.  Generally, we only contact you if it is a positive test result.  In the meantime, if you develop worsening symptoms including fever, chest pain, shortness of breath despite our current treatment plan then please report to the emergency room as this may be a sign of worsening status from possible viral infection.  Otherwise, we will manage this as a viral syndrome. For sore throat or cough try using a honey-based tea. Use 3 teaspoons of honey with juice squeezed from half lemon. Place shaved pieces of ginger into 1/2-1 cup of water and warm over stove top. Then mix the ingredients and repeat every 4 hours as needed. Please take Tylenol 500mg -650mg  every 6 hours for aches and pains, fevers. Hydrate very well with at least 2 liters of water. Eat light meals such as soups to replenish electrolytes and soft fruits, veggies. If you test positive for COVID 19 we will prescribe Paxlovid with the kidney dosing. I recommend not taking Crestor (rosuvastatin) while taking Paxlovid.

## 2022-04-29 ENCOUNTER — Telehealth: Payer: Self-pay

## 2022-04-29 NOTE — Telephone Encounter (Signed)
Patient verification complete (name and date of birth).   The patient called requesting lab results for covid test. Patient made aware, no further questions from the patient.  

## 2022-06-07 ENCOUNTER — Encounter: Payer: Self-pay | Admitting: Podiatry

## 2022-06-07 ENCOUNTER — Ambulatory Visit (INDEPENDENT_AMBULATORY_CARE_PROVIDER_SITE_OTHER): Payer: Medicare Other | Admitting: Podiatry

## 2022-06-07 VITALS — BP 155/90

## 2022-06-07 DIAGNOSIS — B351 Tinea unguium: Secondary | ICD-10-CM | POA: Diagnosis not present

## 2022-06-07 DIAGNOSIS — N183 Chronic kidney disease, stage 3 unspecified: Secondary | ICD-10-CM | POA: Diagnosis not present

## 2022-06-07 NOTE — Progress Notes (Signed)
This patient returns to the office for evaluation and treatment of long thick painful nails .  This patient is unable to trim his own nails since the patient cannot reach his feet.  Patient says the nails are painful walking and wearing his shoes.  He returns for preventive foot care services.  General Appearance  Alert, conversant and in no acute stress.  Vascular  Dorsalis pedis and posterior tibial  pulses are weakly  palpable  bilaterally.  Capillary return is within normal limits  bilaterally. Temperature is within normal limits  bilaterally.  Neurologic  Senn-Weinstein monofilament wire test within normal limits  bilaterally. Muscle power within normal limits bilaterally.  Nails Thick disfigured discolored nails with subungual debris  from hallux to fifth toes bilaterally. No evidence of bacterial infection or drainage bilaterally.  Orthopedic  No limitations of motion  feet .  No crepitus or effusions noted.  No bony pathology or digital deformities noted.  HAV  B/L.  Skin  normotropic skin with no porokeratosis noted bilaterally.  No signs of infections or ulcers noted.     Onychomycosis  Pain in toes right foot  Pain in toes left foot  Debridement  of nails  1-5  B/L with a nail nipper.  Nails were then filed using a dremel tool with no incidents.    RTC 12 weeks   Gardiner Barefoot DPM

## 2022-07-17 ENCOUNTER — Encounter: Payer: Self-pay | Admitting: Podiatry

## 2022-07-26 ENCOUNTER — Ambulatory Visit
Admission: EM | Admit: 2022-07-26 | Discharge: 2022-07-26 | Disposition: A | Discharge status: Home / Self Care | Payer: Medicare Other

## 2022-07-26 DIAGNOSIS — M7989 Other specified soft tissue disorders: Secondary | ICD-10-CM

## 2022-07-26 NOTE — ED Provider Notes (Signed)
[facility]    CSN: [id number] Arrival date & time: 07/26/22  1435      History   Chief Complaint Chief Complaint  Patient presents with   Foot Swelling   Hoarse         HPI Christopher Shepherd is a 89 y.o. male for evaluation of lower extremity swelling.  Patient has a complex medical history including hypertension, hyperlipidemia, chronic kidney disease, hypertensive heart and renal disease.  He presents today with 1 week of worsening bilateral lower extremity swelling.  States he normally has swelling in his feet that resolves or improves with elevation.  He does take furosemide daily.  He states over the past week it is failed to improve with elevation and has worsened.  He denies any cough, shortness of breath, orthopnea, chest pain.  Denies any change in medications or increase in salt intake.  Denies calf pain.  No injuries to the area.  In addition he states he has had some laryngitis/hoarseness without any URI symptoms.  No fevers or chills.  No other concerns at this time.  HPI  Past Medical History:  Diagnosis Date   Allergic rhinitis    Anemia    Arthritis    osteoarthritis   Asthma 1994   with bronchitis   Cough    Extremity edema    Hyperlipidemia    Hypertension    Obstructive sleep apnea    uses a C-Pap    Patient Active Problem List   Diagnosis Date Noted   Encounter for immunization 11/30/2021   Spondylosis without myelopathy or radiculopathy, lumbar region 11/30/2021   Encounter for fitting and adjustment of hearing aid 12/13/2020   Abnormal glucose level 09/06/2020   Artificial knee joint present 09/06/2020   Chronic kidney disease 09/06/2020   Edema 09/06/2020   Obesity 09/06/2020   Polyphagia 09/06/2020   Primary localized osteoarthrosis, lower leg 09/06/2020   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   Chronic asthmatic bronchitis with acute exacerbation ([place]) 07/16/2014   Pain in lower limb 11/24/2013   S/P total knee  arthroplasty 10/23/2013   Hyperlipidemia 10/08/2013   Constipation 10/08/2013   Essential hypertension, benign 10/08/2013   Hiccups 10/08/2013   Acute blood loss anemia 10/06/2013   OA (osteoarthritis) of knee 10/04/2013   Onychomycosis 10/30/2012   Pain in joint, ankle and foot 10/30/2012   Allergic rhinitis due to pollen 09/05/2010   Vitamin D deficiency 03/03/2009   Obstructive sleep apnea 04/16/2007   Allergic-infective asthma 04/16/2007   COUGH 04/16/2007   History of colonic polyps 08/06/2005    Past Surgical History:  Procedure Laterality Date   COLONOSCOPY W/ POLYPECTOMY     TOTAL KNEE ARTHROPLASTY Right 10/04/2013   Procedure: RIGHT TOTAL KNEE ARTHROPLASTY;  Surgeon: Gearlean Alf, MD;  Location: [facility];  Service: Orthopedics;  Laterality: Right;       Home Medications    Prior to Admission medications   Medication Sig Start Date End Date Taking? Authorizing Provider  acetaminophen (TYLENOL) 325 MG tablet TAKE TWO TABLETS BY MOUTH DAILY AS NEEDED FOR PAIN 09/05/21   [provider]  acetaminophen (TYLENOL) 650 MG CR tablet Take 650 mg by mouth every 8 (eight) hours as needed for pain.    [provider]  amLODipine (NORVASC) 10 MG tablet Take 10 mg by mouth daily. Patient not taking: Reported on 12/04/2021    [provider]  chlorhexidine (PERIDEX) 0.12 % solution chlorhexidine gluconate 0.12 % mouthwash    [provider]  Cholecalciferol (VITAMIN D-3) 1000 units CAPS Take 1 capsule by mouth daily.     [provider]  Cholecalciferol 50 MCG (2000 UT) TABS Take 1 tablet by mouth daily. 09/06/21   [provider]  furosemide (LASIX) 20 MG tablet Take 1 tablet by mouth daily. 08/23/20   [provider]  furosemide (LASIX) 20 MG tablet Take 1 tablet (20 mg total) by mouth daily for 3 days. 04/26/22 04/29/22  Elgie Congo, MD  gabapentin (NEURONTIN) 100 MG capsule TAKE TWO CAPSULES BY MOUTH AT BEDTIME FOR  BACK PAIN 10/12/21   [provider]  hydrALAZINE (APRESOLINE) 25 MG tablet Take 50 mg by mouth 2 (two) times daily. 03/10/20   [provider]  lidocaine (LIDODERM) 5 % Place 1 patch onto the skin daily. Remove & Discard patch within 12 hours or as directed by MD 09/03/21   Malvin Johns, MD  losartan (COZAAR) 100 MG tablet Take 100 mg by mouth every morning.    [provider]  metoprolol succinate (TOPROL-XL) 25 MG 24 hr tablet Take 12.5 mg by mouth daily.     [provider]  Omega 3 1000 MG CAPS Take 1 capsule by mouth.     [provider]  rosuvastatin (CRESTOR) 20 MG tablet Take 10 mg by mouth every evening.     [provider]  vitamin B-12 (CYANOCOBALAMIN) 100 MCG tablet Take 100 mcg by mouth daily.    [provider]    Family History Family History  Problem Relation Age of Onset   Cancer Father        cause of death   Colon cancer Mother        cause of death    Social History Social History   Tobacco Use   Smoking status: Former    Packs/day: 0.20    Years: 15.00    Total pack years: 3.00    Types: Cigarettes    Quit date: 07/01/1968    Years since quitting: 54.1   Smokeless tobacco: Former   Tobacco comments:    Quit 50 yrs ago as of 2014  Vaping Use   Vaping Use: Never used  Substance Use Topics   Alcohol use: No    Alcohol/week: 3.0 standard drinks of alcohol    Types: 3 Cans of beer per week   Drug use: No     Allergies   Fosinopril   Review of Systems Review of Systems  HENT:  Positive for voice change.   Cardiovascular:  Positive for leg swelling.     Physical Exam Triage Vital Signs ED Triage Vitals  Enc Vitals Group     BP 07/26/22 1509 (!) 152/69     Pulse Rate 07/26/22 1509 65     Resp 07/26/22 1509 16     Temp 07/26/22 1509 99 F (37.2 C)     Temp Source 07/26/22 1509 Oral     SpO2 07/26/22 1509 95 %     Weight --      Height --      Head Circumference --      Peak Flow  --      Pain Score 07/26/22 1513 0     Pain Loc --      Pain Edu? --      Excl. in [place]? --    No data found.  Updated Vital Signs BP (!) 152/69 (BP Location: Right Arm)   Pulse 65   Temp  99 F (37.2 C) (Oral)   Resp 16   SpO2 95%   Visual Acuity Right Eye Distance:   Left Eye Distance:   Bilateral Distance:    Right Eye Near:   Left Eye Near:    Bilateral Near:     Physical Exam Vitals and nursing note reviewed.  Constitutional:      Appearance: Normal appearance.  HENT:     Head: Normocephalic and atraumatic.     Mouth/Throat:     Mouth: Mucous membranes are moist.     Pharynx: No oropharyngeal exudate or posterior oropharyngeal erythema.  Eyes:     Pupils: Pupils are equal, round, and reactive to light.  Cardiovascular:     Rate and Rhythm: Normal rate.  Pulmonary:     Effort: Pulmonary effort is normal.     Breath sounds: Normal breath sounds. No wheezing or rhonchi.  Skin:    General: Skin is warm and dry.     Comments: +2 pitting edema bilateral feet that extends to +1 pitting edema to just below bilateral knees.  No calf pain.  No erythema or warmth of the feet or calves.  Neurological:     General: No focal deficit present.     Mental Status: He is alert and oriented to person, place, and time.  Psychiatric:        Mood and Affect: Mood normal.        Behavior: Behavior normal.      UC Treatments / Results  Labs (all labs ordered are listed, but only abnormal results are displayed) Labs Reviewed - No data to display  EKG   Radiology No results found.  Procedures Procedures (including critical care time)  Medications Ordered in UC Medications - No data to display  Initial Impression / Assessment and Plan / UC Course  I have reviewed the triage vital signs and the nursing notes.  Pertinent labs & imaging results that were available during my care of the patient were reviewed by me and considered in my medical decision making (see chart for  details).     Reviewed exam and symptoms with patient.  He is hemodynamically stable and in no acute distress.  Discussed limitations and abilities of urgent care. Given his worsening lower extremity edema I did advise him to go to the emergency room for further evaluation and treatment.  Patient is in agreement with plan will go POV to the emergency room.  He was instructed to pull over and call 911 for any worsening or new symptoms that occur in transit and he verbalized understanding. Final Clinical Impressions(s) / UC Diagnoses   Final diagnoses:  Swelling of left lower extremity  Swelling of right lower extremity     Discharge Instructions      Please go to the emergency room for further evaluation of your lower extremity swelling     ED Prescriptions   None    PDMP not reviewed this encounter.   Melynda Ripple, NP 07/26/22 [phone]

## 2022-07-26 NOTE — Discharge Instructions (Addendum)
Please go to the emergency room for further evaluation of your lower extremity swelling

## 2022-07-26 NOTE — ED Triage Notes (Signed)
Pt c/o left sided ankle swelling for about a week and hoarseness.

## 2022-07-26 NOTE — ED Notes (Signed)
Patient is being discharged from the Urgent Care and sent to the Emergency Department via POV. Per Rosario Adie NP , patient is in need of higher level of care due to need for further evaluation . Patient is aware and verbalizes understanding of plan of care.  Vitals:   07/26/22 1509  BP: (!) 152/69  Pulse: 65  Resp: 16  Temp: 99 F (37.2 C)  SpO2: 95%

## 2022-08-26 NOTE — Progress Notes (Unsigned)
Cardiology Office Note:   Date:  08/28/2022  NAME:  Christopher Shepherd    MRN: [id number] DOB:  08/23/1926   PCP:  Willey Blade, MD  Cardiologist:  None  Electrophysiologist:  None   Referring MD: Willey Blade, MD   Chief Complaint  Patient presents with   Leg Swelling   History of Present Illness:   Christopher Shepherd is a 89 y.o. male with a hx of HTN, HLD, orthostatic hypotension who presents for follow-up. Seen in ER 07/26/2022 for LE edema.  She reports over the past few months has had worsening lower extremity edema.  He was seen in the emergency room in October for lower extremity edema.  Was started on Lasix.  Weights are stable.  Still with some lower extremity edema.  He reports he is having some shortness of breath.  His most recent CBC shows he is anemic.  Also with low platelet level.  His blood pressure seems to be up-and-down.  He does describe some burning in his lower extremities.  We discussed continuing his Lasix.  I am a bit concerned he may have cardiac amyloidosis.  I recommended further evaluation with a repeat echo and PYP scan.  He denies any chest pain.  He presents with his wife and daughter.  Apparently he eats a lot of salty meals.  He does eat a lot out of a can.  Most recent EKG shows sinus rhythm.  He does have some lower extremity edema.  Weights are stable.  Lungs are clear.  Problem List 1. Advanced age 57. Orthostatic hypotension/HFpEF 3. HTN 4. HLD 5. PACs 6. 1AVB 7. OSA  Past Medical History: Past Medical History:  Diagnosis Date   Allergic rhinitis    Anemia    Arthritis    osteoarthritis   Asthma 1994   with bronchitis   Cough    Extremity edema    Hyperlipidemia    Hypertension    Obstructive sleep apnea    uses a C-Pap    Past Surgical History: Past Surgical History:  Procedure Laterality Date   COLONOSCOPY W/ POLYPECTOMY     TOTAL KNEE ARTHROPLASTY Right 10/04/2013   Procedure: RIGHT TOTAL KNEE ARTHROPLASTY;  Surgeon:  Gearlean Alf, MD;  Location: [facility];  Service: Orthopedics;  Laterality: Right;    Current Medications: Current Meds  Medication Sig   acetaminophen (TYLENOL) 650 MG CR tablet Take 650 mg by mouth every 8 (eight) hours as needed for pain.   chlorhexidine (PERIDEX) 0.12 % solution chlorhexidine gluconate 0.12 % mouthwash   Cholecalciferol (VITAMIN D-3) 1000 units CAPS Take 1 capsule by mouth daily.    Cholecalciferol 50 MCG (2000 UT) TABS Take 1 tablet by mouth daily.   furosemide (LASIX) 20 MG tablet Take 1 tablet by mouth daily.   gabapentin (NEURONTIN) 100 MG capsule TAKE TWO CAPSULES BY MOUTH AT BEDTIME FOR BACK PAIN   hydrALAZINE (APRESOLINE) 25 MG tablet Take 50 mg by mouth 2 (two) times daily.   lidocaine (LIDODERM) 5 % Place 1 patch onto the skin daily. Remove & Discard patch within 12 hours or as directed by MD   losartan (COZAAR) 100 MG tablet Take 100 mg by mouth every morning.   metoprolol succinate (TOPROL-XL) 25 MG 24 hr tablet Take 12.5 mg by mouth daily.    Omega 3 1000 MG CAPS Take 1 capsule by mouth.    rosuvastatin (CRESTOR) 20 MG tablet Take 10 mg by mouth every evening.    vitamin B-12 (CYANOCOBALAMIN)  100 MCG tablet Take 100 mcg by mouth daily.     Allergies:    Fosinopril   Social History: Social History   Socioeconomic History   Marital status: Married    Spouse name: Not on file   Number of children: 2   Years of education: Not on file   Highest education level: Not on file  Occupational History   Not on file  Tobacco Use   Smoking status: Former    Packs/day: 0.20    Years: 15.00    Total pack years: 3.00    Types: Cigarettes    Quit date: 07/01/1968    Years since quitting: 54.1   Smokeless tobacco: Former   Tobacco comments:    Quit 50 yrs ago as of 2014  Vaping Use   Vaping Use: Never used  Substance and Sexual Activity   Alcohol use: No    Alcohol/week: 3.0 standard drinks of alcohol    Types: 3 Cans of beer per week   Drug use: No    Sexual activity: Not on file  Other Topics Concern   Not on file  Social History Narrative   Not on file   Social Determinants of Health   Financial Resource Strain: Not on file  Food Insecurity: Not on file  Transportation Needs: Not on file  Physical Activity: Not on file  Stress: Not on file  Social Connections: Not on file     Family History: The patient's family history includes Cancer in his father; Colon cancer in his mother.  ROS:   All other ROS reviewed and negative. Pertinent positives noted in the HPI.     EKGs/Labs/Other Studies Reviewed:   The following studies were personally reviewed by me today:  TTE 02/17/2020  1. Left ventricular ejection fraction, by estimation, is 55 to 60%. The  left ventricle has normal function. The left ventricle has no regional  wall motion abnormalities. There is moderate eccentric left ventricular  hypertrophy. Left ventricular  diastolic parameters are consistent with Grade II diastolic dysfunction  (pseudonormalization).   2. Right ventricular systolic function is normal. The right ventricular  size is normal. There is normal pulmonary artery systolic pressure.   3. Left atrial size was moderately dilated.   4. Right atrial size was moderately dilated.   5. The mitral valve is normal in structure. Trivial mitral valve  regurgitation.   6. The aortic valve is tricuspid. Aortic valve regurgitation is not  visualized. No aortic stenosis is present.   7. The inferior vena cava is normal in size with greater than 50%  respiratory variability, suggesting right atrial pressure of 3 mmHg.   Recent Labs: 04/26/2022: ALT 20; B Natriuretic Peptide 311.2; BUN 17; Creatinine, Ser 1.43; Hemoglobin 10.5; Magnesium 2.1; Platelets 130; Potassium 3.7; Sodium 141   Recent Lipid Panel No results found for: "CHOL", "TRIG", "HDL", "CHOLHDL", "VLDL", "LDLCALC", "LDLDIRECT"  Physical Exam:   VS:  BP (!) 145/70   Pulse 68   Ht '5\' 6"'$  (1.676 m)    Wt 162 lb 3.2 oz (73.6 kg)   SpO2 98%   BMI 26.18 kg/m    Wt Readings from Last 3 Encounters:  08/28/22 162 lb 3.2 oz (73.6 kg)  12/10/21 165 lb (74.8 kg)  12/04/21 165 lb (74.8 kg)    General: Well nourished, well developed, in no acute distress Head: Atraumatic, normal size  Eyes: PEERLA, EOMI  Neck: Supple, no JVD Endocrine: No thryomegaly Cardiac: Normal S1, S2; RRR; no murmurs,  rubs, or gallops Lungs: Clear to auscultation bilaterally, no wheezing, rhonchi or rales  Abd: Soft, nontender, no hepatomegaly  Ext: 1+ lower extreme edema Musculoskeletal: No deformities, BUE and BLE strength normal and equal Skin: Warm and dry, no rashes   Neuro: Alert and oriented to person, place, time, and situation, CNII-XII grossly intact, no focal deficits  Psych: Normal mood and affect   ASSESSMENT:   Christopher Shepherd is a 89 y.o. male who presents for the following: 1. Chronic diastolic heart failure ([place])   2. Primary hypertension   3. Orthostatic hypotension   4. Neuropathy   5. Abnormal glucose level     PLAN:   1. Chronic diastolic heart failure ([place]) 2. Primary hypertension 3. Orthostatic hypotension 4. Neuropathy -He presents with symptoms of congestive heart failure.  Recent BNP value was elevated.  Testing in 2021 showed grade 2 diastolic dysfunction.  He did have some LVH.  I would like to repeat his echocardiogram and PYP scan.  He may have cardiac amyloidosis.  He will continue with Lasix 40 mg daily.  He does describe symptoms of neuropathy.  He was anemic and had thrombocytopenia on his most recent panel.  We will check a full panel of labs including CMP, CBC, TSH and vitamin B12 level.  I would also like to check an A1c.  He will see me back in 3 months to discuss further.  We discussed salt reduction strategies.  This will help him.  His blood pressure values are within limits for his age.  Disposition: Return in about 3 months (around 11/26/2022).  Medication  Adjustments/Labs and Tests Ordered: Current medicines are reviewed at length with the patient today.  Concerns regarding medicines are outlined above.  Orders Placed This Encounter  Procedures   Comprehensive metabolic panel   CBC   TSH   Hemoglobin A1c   Vitamin B12   MYOCARDIAL AMYLOID IMAGING PLANAR AND SPECT   ECHOCARDIOGRAM COMPLETE   Meds ordered this encounter  Medications   furosemide (LASIX) 20 MG tablet    Sig: Take 2 tablets (40 mg total) by mouth daily.    Dispense:  60 tablet    Refill:  1    Patient Instructions  Medication Instructions:  Increase Lasix to 40 mg daily   *If you need a refill on your cardiac medications before your next appointment, please call your pharmacy*   Lab Work: CMET, CBC, TSH, B12, A1C today   If you have labs (blood work) drawn today and your tests are completely normal, you will receive your results only by: [place] (if you have MyChart) OR A paper copy in the mail If you have any lab test that is abnormal or we need to change your treatment, we will call you to review the results.   Testing/Procedures: Echocardiogram - Your physician has requested that you have an echocardiogram. Echocardiography is a painless test that uses sound waves to create images of your heart. It provides your doctor with information about the size and shape of your heart and how well your heart's chambers and valves are working. This procedure takes approximately one hour. There are no restrictions for this procedure.   PYP SCAN    Follow-Up: At [facility], you and your health needs are our priority.  As part of our continuing mission to provide you with exceptional heart care, we have created designated Provider Care Teams.  These Care Teams include your primary Cardiologist (physician) and Advanced Practice Providers (APPs -  Physician Assistants and Nurse Practitioners) who all work together to provide you with the care you need, when  you need it.  We recommend signing up for the patient portal called "MyChart".  Sign up information is provided on this After Visit Summary.  MyChart is used to connect with patients for Virtual Visits (Telemedicine).  Patients are able to view lab/test results, encounter notes, upcoming appointments, etc.  Non-urgent messages can be sent to your provider as well.   To learn more about what you can do with MyChart, go to [email or web address].    Your next appointment:   3 month(s)  Provider:   Eleonore Chiquito, MD      Time Spent with Patient: I have spent a total of 35 minutes with patient reviewing hospital notes, telemetry, EKGs, labs and examining the patient as well as establishing an assessment and plan that was discussed with the patient.  > 50% of time was spent in direct patient care.  Signed, Addison Naegeli. Audie Box, MD, [place]  [address] [place], [place] [address] [phone]  08/28/2022 1:56 PM

## 2022-08-28 ENCOUNTER — Ambulatory Visit: Payer: Medicare Other | Attending: Cardiovascular Disease | Admitting: Cardiovascular Disease

## 2022-08-28 ENCOUNTER — Encounter: Payer: Self-pay | Admitting: Cardiovascular Disease

## 2022-08-28 VITALS — BP 145/70 | HR 68 | Ht 66.0 in | Wt 162.2 lb

## 2022-08-28 DIAGNOSIS — G629 Polyneuropathy, unspecified: Secondary | ICD-10-CM | POA: Diagnosis present

## 2022-08-28 DIAGNOSIS — I5032 Chronic diastolic (congestive) heart failure: Secondary | ICD-10-CM | POA: Insufficient documentation

## 2022-08-28 DIAGNOSIS — I10 Essential (primary) hypertension: Secondary | ICD-10-CM | POA: Diagnosis present

## 2022-08-28 DIAGNOSIS — I951 Orthostatic hypotension: Secondary | ICD-10-CM | POA: Diagnosis present

## 2022-08-28 DIAGNOSIS — R7309 Other abnormal glucose: Secondary | ICD-10-CM | POA: Insufficient documentation

## 2022-08-28 MED ORDER — FUROSEMIDE 20 MG PO TABS: 40.0000 mg | ORAL_TABLET | Freq: Every day | ORAL | 1 refills | Status: DC

## 2022-08-28 NOTE — Patient Instructions (Signed)
Medication Instructions:  Increase Lasix to 40 mg daily   *If you need a refill on your cardiac medications before your next appointment, please call your pharmacy*   Lab Work: CMET, CBC, TSH, B12, A1C today   If you have labs (blood work) drawn today and your tests are completely normal, you will receive your results only by: [place] (if you have MyChart) OR A paper copy in the mail If you have any lab test that is abnormal or we need to change your treatment, we will call you to review the results.   Testing/Procedures: Echocardiogram - Your physician has requested that you have an echocardiogram. Echocardiography is a painless test that uses sound waves to create images of your heart. It provides your doctor with information about the size and shape of your heart and how well your heart's chambers and valves are working. This procedure takes approximately one hour. There are no restrictions for this procedure.   PYP SCAN    Follow-Up: At [facility], you and your health needs are our priority.  As part of our continuing mission to provide you with exceptional heart care, we have created designated Provider Care Teams.  These Care Teams include your primary Cardiologist (physician) and Advanced Practice Providers (APPs -  Physician Assistants and Nurse Practitioners) who all work together to provide you with the care you need, when you need it.  We recommend signing up for the patient portal called "MyChart".  Sign up information is provided on this After Visit Summary.  MyChart is used to connect with patients for Virtual Visits (Telemedicine).  Patients are able to view lab/test results, encounter notes, upcoming appointments, etc.  Non-urgent messages can be sent to your provider as well.   To learn more about what you can do with MyChart, go to [email or web address].    Your next appointment:   3 month(s)  Provider:   Eleonore Chiquito, MD

## 2022-08-29 LAB — CBC
Hematocrit: 34.5 % — ABNORMAL LOW (ref 37.5–51.0)
Hemoglobin: 11.2 g/dL — ABNORMAL LOW (ref 13.0–17.7)
MCH: 27.9 pg (ref 26.6–33.0)
MCHC: 32.5 g/dL (ref 31.5–35.7)
MCV: 86 fL (ref 79–97)
Platelets: 147 10*3/uL — ABNORMAL LOW (ref 150–450)
RBC: 4.02 x10E6/uL — ABNORMAL LOW (ref 4.14–5.80)
RDW: 13.2 % (ref 11.6–15.4)
WBC: 4.9 10*3/uL (ref 3.4–10.8)

## 2022-08-29 LAB — COMPREHENSIVE METABOLIC PANEL
ALT: 17 IU/L (ref 0–44)
AST: 15 IU/L (ref 0–40)
Albumin/Globulin Ratio: 1.6 (ref 1.2–2.2)
Albumin: 4.1 g/dL (ref 3.6–4.6)
Alkaline Phosphatase: 56 IU/L (ref 44–121)
BUN/Creatinine Ratio: 13 (ref 10–24)
BUN: 20 mg/dL (ref 10–36)
Bilirubin Total: 0.9 mg/dL (ref 0.0–1.2)
CO2: 23 mmol/L (ref 20–29)
Calcium: 9.1 mg/dL (ref 8.6–10.2)
Chloride: 105 mmol/L (ref 96–106)
Creatinine, Ser: 1.53 mg/dL — ABNORMAL HIGH (ref 0.76–1.27)
Globulin, Total: 2.5 g/dL (ref 1.5–4.5)
Glucose: 83 mg/dL (ref 70–99)
Potassium: 4.2 mmol/L (ref 3.5–5.2)
Sodium: 146 mmol/L — ABNORMAL HIGH (ref 134–144)
Total Protein: 6.6 g/dL (ref 6.0–8.5)
eGFR: 42 mL/min/{1.73_m2} — ABNORMAL LOW (ref >59)

## 2022-08-29 LAB — TSH: TSH: 1.67 u[IU]/mL (ref 0.450–4.500)

## 2022-08-29 LAB — HEMOGLOBIN A1C
Est. average glucose Bld gHb Est-mCnc: 114 mg/dL
Hgb A1c MFr Bld: 5.6 % (ref 4.8–5.6)

## 2022-08-29 LAB — VITAMIN B12: Vitamin B-12: 1418 pg/mL — ABNORMAL HIGH (ref 232–1245)

## 2022-09-04 ENCOUNTER — Ambulatory Visit: Payer: Medicare Other | Admitting: Podiatry

## 2022-09-04 ENCOUNTER — Telehealth (HOSPITAL_COMMUNITY): Payer: Self-pay | Admitting: *Deleted

## 2022-09-04 NOTE — Telephone Encounter (Signed)
Attempted to call patient regarding upcoming appointment- no answer, unable to leave a message.  Christopher Shepherd

## 2022-09-10 ENCOUNTER — Ambulatory Visit: Payer: Medicare Other | Admitting: Podiatry

## 2022-09-11 ENCOUNTER — Ambulatory Visit (HOSPITAL_COMMUNITY): Payer: Medicare Other | Attending: Cardiovascular Disease

## 2022-09-11 DIAGNOSIS — I5032 Chronic diastolic (congestive) heart failure: Secondary | ICD-10-CM | POA: Diagnosis present

## 2022-09-11 MED ORDER — TECHNETIUM TC 99M PYROPHOSPHATE
20.4000 | Freq: Once | INTRAVENOUS | Status: AC
  Administered 2022-09-11: 20.4 via INTRAVENOUS

## 2022-09-25 ENCOUNTER — Ambulatory Visit (HOSPITAL_COMMUNITY): Payer: Medicare Other | Attending: Cardiovascular Disease

## 2022-09-25 DIAGNOSIS — I5032 Chronic diastolic (congestive) heart failure: Secondary | ICD-10-CM | POA: Insufficient documentation

## 2022-09-25 LAB — ECHOCARDIOGRAM COMPLETE
Area-P 1/2: 2.87 cm2
S' Lateral: 2.7 cm

## 2022-10-31 ENCOUNTER — Encounter: Payer: Self-pay | Admitting: Podiatry

## 2022-10-31 ENCOUNTER — Ambulatory Visit (INDEPENDENT_AMBULATORY_CARE_PROVIDER_SITE_OTHER): Payer: Medicare Other | Admitting: Podiatry

## 2022-10-31 DIAGNOSIS — B351 Tinea unguium: Secondary | ICD-10-CM | POA: Diagnosis not present

## 2022-10-31 DIAGNOSIS — N183 Chronic kidney disease, stage 3 unspecified: Secondary | ICD-10-CM | POA: Diagnosis not present

## 2022-10-31 NOTE — Progress Notes (Signed)
This patient returns to the office for evaluation and treatment of long thick painful nails .  This patient is unable to trim his own nails since the patient cannot reach his feet.  Patient says the nails are painful walking and wearing his shoes.  He returns for preventive foot care services.  General Appearance  Alert, conversant and in no acute stress.  Vascular  Dorsalis pedis and posterior tibial  pulses are weakly  palpable  bilaterally.  Capillary return is within normal limits  bilaterally. Temperature is within normal limits  bilaterally.  Neurologic  Senn-Weinstein monofilament wire test within normal limits  bilaterally. Muscle power within normal limits bilaterally.  Nails Thick disfigured discolored nails with subungual debris  from hallux to fifth toes bilaterally. No evidence of bacterial infection or drainage bilaterally.  Orthopedic  No limitations of motion  feet .  No crepitus or effusions noted.  No bony pathology or digital deformities noted.  HAV  B/L.  Skin  normotropic skin with no porokeratosis noted bilaterally.  No signs of infections or ulcers noted.     Onychomycosis  Pain in toes right foot  Pain in toes left foot  Debridement  of nails  1-5  B/L with a nail nipper.  Nails were then filed using a dremel tool with no incidents.    RTC 12 weeks   Mariesa Grieder DPM  

## 2022-11-18 ENCOUNTER — Ambulatory Visit (INDEPENDENT_AMBULATORY_CARE_PROVIDER_SITE_OTHER): Payer: Medicare Other | Admitting: Podiatry

## 2022-11-18 DIAGNOSIS — Z91199 Patient's noncompliance with other medical treatment and regimen due to unspecified reason: Secondary | ICD-10-CM

## 2022-11-20 NOTE — Progress Notes (Signed)
1. No-show for appointment    Recent visit with Dr. Stacie Acres in [place]. No charge.

## 2022-12-05 ENCOUNTER — Ambulatory Visit: Payer: No Typology Code available for payment source | Admitting: Internal Medicine

## 2022-12-08 NOTE — Progress Notes (Unsigned)
Cardiology Office Note:   Date:  12/08/2022  NAME:  Christopher Shepherd    MRN: [id number] DOB:  [birth year]   PCP:  Andi Devon, MD  Cardiologist:  None  Electrophysiologist:  None   Referring MD: Andi Devon, MD   No chief complaint on file. ***  History of Present Illness:   Christopher Shepherd is a 89 y.o. male with a hx of HFpEF, HTN, HLD who presents for follow-up.    Problem List 1. Advanced age 48. Orthostatic hypotension/HFpEF -PYP negative  3. HTN 4. HLD 5. PACs 6. 1AVB 7. OSA  Past Medical History: Past Medical History:  Diagnosis Date   Allergic rhinitis    Anemia    Arthritis    osteoarthritis   Asthma 1994   with bronchitis   Cough    Extremity edema    Hyperlipidemia    Hypertension    Obstructive sleep apnea    uses a C-Pap    Past Surgical History: Past Surgical History:  Procedure Laterality Date   COLONOSCOPY W/ POLYPECTOMY     TOTAL KNEE ARTHROPLASTY Right 10/04/2013   Procedure: RIGHT TOTAL KNEE ARTHROPLASTY;  Surgeon: Loanne Drilling, MD;  Location: [facility];  Service: Orthopedics;  Laterality: Right;    Current Medications: No outpatient medications have been marked as taking for the 12/09/22 encounter (Appointment) with O'Neal, Ronnald Ramp, MD.     Allergies:    Fosinopril   Social History: Social History   Socioeconomic History   Marital status: Married    Spouse name: Not on file   Number of children: 2   Years of education: Not on file   Highest education level: Not on file  Occupational History   Not on file  Tobacco Use   Smoking status: Former    Packs/day: 0.20    Years: 15.00    Additional pack years: 0.00    Total pack years: 3.00    Types: Cigarettes    Quit date: 07/01/1968    Years since quitting: 54.4   Smokeless tobacco: Former   Tobacco comments:    Quit 50 yrs ago as of 2014  Vaping Use   Vaping Use: Never used  Substance and Sexual Activity   Alcohol use: No    Alcohol/week: 3.0 standard  drinks of alcohol    Types: 3 Cans of beer per week   Drug use: No   Sexual activity: Not on file  Other Topics Concern   Not on file  Social History Narrative   Not on file   Social Determinants of Health   Financial Resource Strain: Not on file  Food Insecurity: Not on file  Transportation Needs: Not on file  Physical Activity: Not on file  Stress: Not on file  Social Connections: Not on file     Family History: The patient's ***family history includes Cancer in his father; Colon cancer in his mother.  ROS:   All other ROS reviewed and negative. Pertinent positives noted in the HPI.     EKGs/Labs/Other Studies Reviewed:   The following studies were personally reviewed by me today:  EKG:  EKG is *** ordered today.  The ekg ordered today demonstrates ***, and was personally reviewed by me.   TTE 09/25/2022  1. Left ventricular ejection fraction, by estimation, is 60 to 65%. The  left ventricle has normal function. The left ventricle has no regional  wall motion abnormalities. There is moderate asymmetric left ventricular  hypertrophy of the basal-septal  segment. Left  ventricular diastolic parameters are consistent with Grade I  diastolic dysfunction (impaired relaxation).   2. Right ventricular systolic function is normal. The right ventricular  size is normal. There is normal pulmonary artery systolic pressure. The  estimated right ventricular systolic pressure is 27.0 mmHg.   3. Left atrial size was mildly dilated.   4. The mitral valve is grossly normal. Trivial mitral valve  regurgitation.   5. The aortic valve is tricuspid. There is mild calcification of the  aortic valve. There is mild thickening of the aortic valve. Aortic valve  regurgitation is not visualized. Aortic valve sclerosis/calcification is  present, without any evidence of  aortic stenosis.   6. Aortic dilatation noted. There is borderline dilatation of the aortic  root, measuring 39 mm.   7. The  inferior vena cava is normal in size with greater than 50%  respiratory variability, suggesting right atrial pressure of 3 mmHg.   PYP   By semi-quantitative assessment scan is consistent with no increased heart uptake-Grade 0. Heart to contralateral lung ratio is between 1-1.5, indeterminate for amyloid.   Study is normal, not suggestive of TTR amyloidosis (visual score of 0/ratio <1).   Prior study not available for comparison.   Low likelihood of TTR cardiac amyloidosis. Consider evaluation for AL amyloidosis if clinically appropriaty.  Recent Labs: 04/26/2022: B Natriuretic Peptide 311.2; Magnesium 2.1 08/28/2022: ALT 17; BUN 20; Creatinine, Ser 1.53; Hemoglobin 11.2; Platelets 147; Potassium 4.2; Sodium 146; TSH 1.670   Recent Lipid Panel No results found for: "CHOL", "TRIG", "HDL", "CHOLHDL", "VLDL", "LDLCALC", "LDLDIRECT"  Physical Exam:   VS:  There were no vitals taken for this visit.   Wt Readings from Last 3 Encounters:  09/11/22 162 lb (73.5 kg)  08/28/22 162 lb 3.2 oz (73.6 kg)  12/10/21 165 lb (74.8 kg)    General: Well nourished, well developed, in no acute distress Head: Atraumatic, normal size  Eyes: PEERLA, EOMI  Neck: Supple, no JVD Endocrine: No thryomegaly Cardiac: Normal S1, S2; RRR; no murmurs, rubs, or gallops Lungs: Clear to auscultation bilaterally, no wheezing, rhonchi or rales  Abd: Soft, nontender, no hepatomegaly  Ext: No edema, pulses 2+ Musculoskeletal: No deformities, BUE and BLE strength normal and equal Skin: Warm and dry, no rashes   Neuro: Alert and oriented to person, place, time, and situation, CNII-XII grossly intact, no focal deficits  Psych: Normal mood and affect   ASSESSMENT:   Christopher Shepherd is a 89 y.o. male who presents for the following: No diagnosis found.  PLAN:   There are no diagnoses linked to this encounter.  {Are you ordering a CV Procedure (e.g. stress test, cath, DCCV, TEE, etc)?   Press F2         :[id number]}  Disposition: No follow-ups on file.  Medication Adjustments/Labs and Tests Ordered: Current medicines are reviewed at length with the patient today.  Concerns regarding medicines are outlined above.  No orders of the defined types were placed in this encounter.  No orders of the defined types were placed in this encounter.   There are no Patient Instructions on file for this visit.   Time Spent with Patient: I have spent a total of *** minutes with patient reviewing hospital notes, telemetry, EKGs, labs and examining the patient as well as establishing an assessment and plan that was discussed with the patient.  > 50% of time was spent in direct patient care.  Signed, Lenna Gilford. Flora Lipps, MD, [facility] [place]  [facility]  [address], [address] [place], [place] [address] [phone]  12/08/2022 8:17 PM

## 2022-12-09 ENCOUNTER — Encounter: Payer: Self-pay | Admitting: Cardiovascular Disease

## 2022-12-09 ENCOUNTER — Ambulatory Visit: Payer: Medicare Other | Attending: Cardiovascular Disease | Admitting: Cardiovascular Disease

## 2022-12-09 VITALS — BP 168/70 | HR 56 | Ht 66.0 in | Wt 162.4 lb

## 2022-12-09 DIAGNOSIS — I5032 Chronic diastolic (congestive) heart failure: Secondary | ICD-10-CM | POA: Diagnosis present

## 2022-12-09 DIAGNOSIS — I10 Essential (primary) hypertension: Secondary | ICD-10-CM

## 2022-12-09 DIAGNOSIS — I951 Orthostatic hypotension: Secondary | ICD-10-CM | POA: Diagnosis present

## 2022-12-09 MED ORDER — FUROSEMIDE 20 MG PO TABS: 20.0000 mg | ORAL_TABLET | Freq: Every day | ORAL | 3 refills | Status: DC

## 2022-12-09 NOTE — Patient Instructions (Signed)
Medication Instructions:  STOP Metoprolol TAKE Lasix 20 mg daily, use extra tablet as needed.  *If you need a refill on your cardiac medications before your next appointment, please call your pharmacy*   Follow-Up: At [facility], you and your health needs are our priority.  As part of our continuing mission to provide you with exceptional heart care, we have created designated Provider Care Teams.  These Care Teams include your primary Cardiologist (physician) and Advanced Practice Providers (APPs -  Physician Assistants and Nurse Practitioners) who all work together to provide you with the care you need, when you need it.  We recommend signing up for the patient portal called "MyChart".  Sign up information is provided on this After Visit Summary.  MyChart is used to connect with patients for Virtual Visits (Telemedicine).  Patients are able to view lab/test results, encounter notes, upcoming appointments, etc.  Non-urgent messages can be sent to your provider as well.   To learn more about what you can do with MyChart, go to [email or web address].    Your next appointment:   6 month(s)  Provider:   Lennie Odor, MD

## 2022-12-31 ENCOUNTER — Ambulatory Visit: Payer: No Typology Code available for payment source | Admitting: Internal Medicine

## 2023-01-22 NOTE — Progress Notes (Signed)
Subjective:    Patient ID: Christopher Shepherd, male    DOB: 30-Aug-1926, 89 y.o.   MRN: [id number]  HPI male never smoker followed for cough, asthma, allergic rhinitis, obstructive sleep apnea NPSG 04/06/2018-AHI 60.8/hour, desaturation to 91%, body weight 202 pounds ---------------------------------------------------------------------------------------------   12/04/21- 89 year old male [place], never smoker followed for Cough, Asthma, Allergic Rhinitis, OSA, Lung Nodule, complicated by HTN, Osteoarthritis, Aortic Atherosclerosis, CAD, Orthostatic Hypotension,  CPAP auto 10-20/ [facility]    Replaced March, 2022  ResMed AirSense 10 Download-not using Body weight today 165 lbs Covid vax-5 Moderna Flu vax-had ECHO in 2021 indicated normal PA pressure. Here today with wife.  He stopped using CPAP and does not feel he needs it although she indicates she does still snore.  He describes some hoarseness in the morning that fades during the day and I suggested this morning hoarseness may be because of snoring since he stopped CPAP.  Note he has lost almost 40 pounds since his original sleep study.  Gabapentin for peripheral neuropathy was causing daytime sleepiness and dosage has been reduced.  He declines ENT referral for hoarseness and denies significant cough or difficulty swallowing.  He does want to continue being followed here.  01/23/23- 89 year old male [place], never smoker followed for Cough, Asthma, Allergic Rhinitis, OSA/ Quit CPAP, Lung Nodule, complicated by HTN, Osteoarthritis, Aortic Atherosclerosis, CAD, Orthostatic Hypotension,  CPAP auto 10-20/ [facility]    Replaced March, 2022  ResMed AirSense 10- DC'd Body weight today - 161 lbs -----States he has been using cpap machine some. Last use was a month ago He feels he is sleeping okay without CPAP and at his age I told him if he was comfortable then realistically it probably was quite reasonable for him to stay off of it. There is some history of  asthma but he has no inhalers and is not wheezing.  Says his breathing is okay sometimes he notices a little hoarseness. He is doing well and he is comfortable without interventions.  I have offered to see him again as needed.  Review of Systems- see HPI + = positive Constitutional:   +  weight loss, night sweats, fevers, chills, fatigue, lassitude. HEENT:   No-  headaches, difficulty swallowing, tooth/dental problems, sore throat,       No-  sneezing, itching, ear ache,    nasal congestion, +post nasal drip,  CV:  No-   chest pain, orthopnea, PND, swelling in lower extremities, anasarca,  dizziness, palpitations Resp: No-   shortness of breath with exertion or at rest.               productive cough,   non-productive cough,  No-  coughing up of blood.               change in color of mucus.   wheezing.   Skin: No-   rash or lesions. GI:  No-   heartburn, indigestion, abdominal pain, nausea, vomiting,  GU:  MS:  No-   joint pain or swelling.   Neuro- nothing unusual  Psych:  No- change in mood or affect. No depression or anxiety.  No memory loss.   Objective:   Physical Exam General- Alert, Oriented, Affect-appropriate, Distress- none acute; elderly Skin- rash-none, lesions- none, excoriation- none Lymphadenopathy- none Head- atraumatic            Eyes- Gross vision intact, PERRLA, conjunctivae clear secretions            Ears- Hearing, canals-normal  Nose- + turbinate edema, no-Septal dev, mucus, polyps, erosion, perforation             Throat- Mallampati IV , mucosa dry , drainage- none, tonsils- atrophic Neck- flexible , trachea midline, no stridor , thyroid nl, carotid no bruit Chest - symmetrical excursion , unlabored           Heart/CV- RRR with frequent skipped beats , no murmur , no gallop  , no rub, nl s1 s2                           - JVD- none , edema+trace, stasis changes- none, varices- none           Lung- clear, wheeze- none, cough-none , dullness-none, rub-  none           Chest wall-  Abd-  Br/ Gen/ Rectal- Not done, not indicated Extrem- cyanosis- none, clubbing, none, atrophy- none, strength- nl, + cane Neuro- grossly intact to observation Assessment & Plan:

## 2023-01-23 ENCOUNTER — Ambulatory Visit (INDEPENDENT_AMBULATORY_CARE_PROVIDER_SITE_OTHER): Payer: Medicare Other | Admitting: Internal Medicine

## 2023-01-23 ENCOUNTER — Encounter: Payer: Self-pay | Admitting: Internal Medicine

## 2023-01-23 VITALS — BP 116/60 | HR 63 | Ht 66.0 in | Wt 161.4 lb

## 2023-01-23 DIAGNOSIS — J45998 Other asthma: Secondary | ICD-10-CM

## 2023-01-23 DIAGNOSIS — G4733 Obstructive sleep apnea (adult) (pediatric): Secondary | ICD-10-CM | POA: Diagnosis not present

## 2023-01-23 NOTE — Patient Instructions (Signed)
I don't have anything to suggest we do to help your breathing now.  I recommend that you follow with your primary doctor. If she needs our help, we would certainly be happy to see you again.

## 2023-01-30 ENCOUNTER — Ambulatory Visit: Payer: Medicare Other | Admitting: Podiatry

## 2023-02-04 ENCOUNTER — Ambulatory Visit: Payer: Medicare Other | Admitting: Podiatry

## 2023-02-04 ENCOUNTER — Encounter: Payer: Self-pay | Admitting: Podiatry

## 2023-02-04 DIAGNOSIS — B351 Tinea unguium: Secondary | ICD-10-CM

## 2023-02-04 DIAGNOSIS — N183 Chronic kidney disease, stage 3 unspecified: Secondary | ICD-10-CM | POA: Diagnosis not present

## 2023-02-04 NOTE — Progress Notes (Signed)
This patient returns to the office for evaluation and treatment of long thick painful nails .  This patient is unable to trim his own nails since the patient cannot reach his feet.  Patient says the nails are painful walking and wearing his shoes.  He returns for preventive foot care services.  General Appearance  Alert, conversant and in no acute stress.  Vascular  Dorsalis pedis and posterior tibial  pulses are weakly  palpable  bilaterally.  Capillary return is within normal limits  bilaterally. Temperature is within normal limits  bilaterally.  Neurologic  Senn-Weinstein monofilament wire test within normal limits  bilaterally. Muscle power within normal limits bilaterally.  Nails Thick disfigured discolored nails with subungual debris  from hallux to fifth toes bilaterally. No evidence of bacterial infection or drainage bilaterally.  Orthopedic  No limitations of motion  feet .  No crepitus or effusions noted.  No bony pathology or digital deformities noted.  HAV  B/L.  Skin  normotropic skin with no porokeratosis noted bilaterally.  No signs of infections or ulcers noted.     Onychomycosis  Pain in toes right foot  Pain in toes left foot  Debridement  of nails  1-5  B/L with a nail nipper.  Nails were then filed using a dremel tool with no incidents.    RTC 12 weeks   Gardiner Barefoot DPM

## 2023-02-14 ENCOUNTER — Encounter: Payer: Self-pay | Admitting: Internal Medicine

## 2023-02-14 NOTE — Assessment & Plan Note (Signed)
Mild intermittent uncomplicated.  He no longer uses inhalers and does not recognize significant wheeze or cough. Plan-we will be happy to see him again if we could be helpful.

## 2023-02-14 NOTE — Assessment & Plan Note (Signed)
He quit using CPAP and says he is comfortable without it.  He thinks he is sleeping okay. Plan-okay to stay off of CPAP

## 2023-02-21 ENCOUNTER — Emergency Department (HOSPITAL_COMMUNITY)
Admission: EM | Admit: 2023-02-21 | Discharge: 2023-02-21 | Disposition: A | Discharge status: Home / Self Care | Payer: No Typology Code available for payment source | Attending: Emergency Medicine | Admitting: Emergency Medicine

## 2023-02-21 ENCOUNTER — Other Ambulatory Visit: Payer: Self-pay

## 2023-02-21 ENCOUNTER — Emergency Department (HOSPITAL_COMMUNITY): Payer: No Typology Code available for payment source

## 2023-02-21 ENCOUNTER — Encounter (HOSPITAL_COMMUNITY): Payer: Self-pay

## 2023-02-21 DIAGNOSIS — W0110XA Fall on same level from slipping, tripping and stumbling with subsequent striking against unspecified object, initial encounter: Secondary | ICD-10-CM | POA: Insufficient documentation

## 2023-02-21 DIAGNOSIS — Y92009 Unspecified place in unspecified non-institutional (private) residence as the place of occurrence of the external cause: Secondary | ICD-10-CM | POA: Insufficient documentation

## 2023-02-21 DIAGNOSIS — M25511 Pain in right shoulder: Secondary | ICD-10-CM | POA: Insufficient documentation

## 2023-02-21 DIAGNOSIS — Z79899 Other long term (current) drug therapy: Secondary | ICD-10-CM | POA: Insufficient documentation

## 2023-02-21 DIAGNOSIS — I10 Essential (primary) hypertension: Secondary | ICD-10-CM | POA: Diagnosis not present

## 2023-02-21 DIAGNOSIS — R42 Dizziness and giddiness: Secondary | ICD-10-CM | POA: Diagnosis not present

## 2023-02-21 DIAGNOSIS — W19XXXA Unspecified fall, initial encounter: Secondary | ICD-10-CM

## 2023-02-21 NOTE — ED Provider Notes (Signed)
[place] EMERGENCY DEPARTMENT AT [facility] Provider Note   CSN: [id number] Arrival date & time: 02/21/23  1516     History  Chief Complaint  Patient presents with   Fall   Shoulder Pain    Christopher Shepherd is a 89 y.o. male with PMH as listed below who presents with fall at home due to dizziness, hit right shoulder and now has pain. No head injury or LOC. No use of blood thinners. States he fell because of his baseline neuropathy and sometimes feels like he "loses his feet underneath him." Has been dealing with those symptoms chronically slowly worsening for 6-12 months. Otherwise has been in his NSOH. No f/c, CP, SOB, palpitations.   Past Medical History:  Diagnosis Date   Allergic rhinitis    Anemia    Arthritis    osteoarthritis   Asthma 1994   with bronchitis   Cough    Extremity edema    Hyperlipidemia    Hypertension    Obstructive sleep apnea    uses a C-Pap       Home Medications Prior to Admission medications   Medication Sig Start Date End Date Taking? Authorizing Provider  acetaminophen (TYLENOL) 650 MG CR tablet Take 650 mg by mouth every 8 (eight) hours as needed for pain.    [provider]  chlorhexidine (PERIDEX) 0.12 % solution chlorhexidine gluconate 0.12 % mouthwash    [provider]  Cholecalciferol 50 MCG (2000 UT) TABS Take 1 tablet by mouth daily. 09/06/21   [provider]  furosemide (LASIX) 20 MG tablet Take 1 tablet (20 mg total) by mouth daily. 12/09/22   O'NealRonnald Ramp, MD  gabapentin (NEURONTIN) 100 MG capsule TAKE TWO CAPSULES BY MOUTH AT BEDTIME FOR BACK PAIN 10/12/21   [provider]  hydrALAZINE (APRESOLINE) 25 MG tablet Take 50 mg by mouth 2 (two) times daily. 03/10/20   [provider]  lidocaine (LIDODERM) 5 % Place 1 patch onto the skin daily. Remove & Discard patch within 12 hours or as directed by MD 09/03/21   Rolan Bucco, MD  losartan (COZAAR) 100 MG tablet Take 100  mg by mouth every morning.    [provider]  Omega 3 1000 MG CAPS Take 1 capsule by mouth.     [provider]  rosuvastatin (CRESTOR) 20 MG tablet Take 10 mg by mouth every evening.     [provider]  vitamin B-12 (CYANOCOBALAMIN) 100 MCG tablet Take 100 mcg by mouth daily.    [provider]      Allergies    Fosinopril    Review of Systems   Review of Systems A 10 point review of systems was performed and is negative unless otherwise reported in HPI.  Physical Exam Updated Vital Signs BP (!) 131/55   Pulse (!) 57   Temp 98.3 F (36.8 C) (Oral)   Resp 16   Ht 5\' 6"  (1.676 m)   Wt 73.2 kg   SpO2 100%   BMI 26.05 kg/m  Physical Exam General: Normal appearing elderly male, sitting in a chair.Marland Kitchen  HEENT: PERRLA, Sclera anicteric, MMM, trachea midline.  NCAT.  No midline C-spine tenderness palpation step-offs or deformities. Cardiology: RRR, no murmurs/rubs/gallops. BL radial and DP pulses equal bilaterally.  No gross signs of trauma, tenderness with patient, or crepitus of chest wall. Resp: Normal respiratory rate and effort. CTAB, no wheezes, rhonchi, crackles.  Abd: Soft, non-tender, non-distended. No rebound tenderness or guarding.  MSK: No peripheral edema or signs of trauma.  Full range of motion of bilateral upper extremities including right shoulder.  No tenderness palpation of the right shoulder.  No gross signs of trauma.  Extremities without deformity or TTP. No cyanosis or clubbing. Skin: warm, dry.  Neuro: A&Ox4, CNs II-XII grossly intact. MAEs. Sensation grossly intact.  Psych: Normal mood and affect.   ED Results / Procedures / Treatments   Labs (all labs ordered are listed, but only abnormal results are displayed) Labs Reviewed - No data to display  EKG EKG Interpretation Date/Time:  Friday February 21 2023 15:35:48 EDT Ventricular Rate:  55 PR Interval:  220 QRS Duration:  87 QT Interval:  463 QTC Calculation: 443 R  Axis:   21  Text Interpretation: Sinus rhythm Prolonged PR interval Confirmed by Vivi Barrack [phone] on 02/21/2023 3:40:44 PM  Radiology DG Shoulder Right  Result Date: 02/21/2023 CLINICAL DATA:  Pain and injury EXAM: RIGHT SHOULDER - 4 VIEW COMPARISON:  None Available. FINDINGS: Severe joint space loss of the glenohumeral joint. Osteophytes. Moderate changes of AC joint. Rounded density medial to the glenoid. Possible joint body. No fracture or dislocation. IMPRESSION: Severe degenerative changes.  Possible joint body. Electronically Signed   By: Karen Kays M.D.   On: 02/21/2023 16:49    Procedures Procedures    Medications Ordered in ED Medications - No data to display  ED Course/ Medical Decision Making/ A&P                          Medical Decision Making Amount and/or Complexity of Data Reviewed Radiology: ordered.    MDM:    Considered shoulder dislocation, fracture to the humerus, clavicle, acromioclavicular joint injury possible. Rotator cuff tear, biceps tendon rupture, and triceps tendon rupture considered as well. Patient with normal ROM, normal sensation, very low c/f serious injury to joint or rotator cuff. No overlying signs of trauma, consider more likely soft tissue contusion or injury that is very mild.  Patient did not hit his head or lose consciousness, no neck pain, ruled out by Nexus criteria do not believe he needs head or neck imaging.   Clinical Course as of 02/21/23 1655  Fri Feb 21, 2023  1655 No acute injuries to his R shoulder XR. Patient with good ROM, sensation. Feeling well. Has f/u with PCP next week. DC w/ discharge instructions/return precautions. All questions answered to patient's satisfaction.   [HN]    Clinical Course User Index [HN] Loetta Rough, MD    Has otherwise been in his normal state of health.  Blames the fall on his neuropathy which has been worsening over the last 6 to 12 months and he states that he has follow-up with his  primary care physician next week for further evaluation.  Do not believe patient requires emergent workup here in the ED and he is overall very well-appearing and hemodynamically stable.  Patient can follow-up with his primary care doctor.   Imaging Studies ordered: R Shoulder XR ordered in triage I independently visualized and interpreted imaging. I agree with the radiologist interpretation  Additional history obtained from chart review.    Social Determinants of Health: Lives independently  Disposition:  DC w/ discharge instructions/return precautions. All questions answered to patient's satisfaction.    Co morbidities that complicate the patient evaluation  Past Medical History:  Diagnosis Date   Allergic rhinitis    Anemia    Arthritis  osteoarthritis   Asthma 1994   with bronchitis   Cough    Extremity edema    Hyperlipidemia    Hypertension    Obstructive sleep apnea    uses a C-Pap     Medicines No orders of the defined types were placed in this encounter.   I have reviewed the patients home medicines and have made adjustments as needed  Problem List / ED Course: Problem List Items Addressed This Visit   None               This note was created using dictation software, which may contain spelling or grammatical errors.    Loetta Rough, MD 02/21/23 [phone]

## 2023-02-21 NOTE — ED Triage Notes (Signed)
Fall at home due to dizziness, hit right shoulder and now has pain. No head injury or LOC. No use of blood thinners.

## 2023-02-21 NOTE — Discharge Instructions (Signed)
Thank you for coming to [facility] Emergency Department. You were seen for fall and right shoulder pain. We did an exam, and imaging, and these showed no acute findings.  You can take Tylenol 1 g every 8 hours as needed for pain. Please follow up with your primary care provider within 1 week.   Do not hesitate to return to the ED or call 911 if you experience: -Worsening symptoms -Lightheadedness, passing out -Fevers/chills -Anything else that concerns you

## 2023-02-24 ENCOUNTER — Ambulatory Visit (INDEPENDENT_AMBULATORY_CARE_PROVIDER_SITE_OTHER): Payer: Medicare Other | Admitting: Orthopaedic Surgery

## 2023-02-24 ENCOUNTER — Other Ambulatory Visit: Payer: Self-pay

## 2023-02-24 DIAGNOSIS — M5442 Lumbago with sciatica, left side: Secondary | ICD-10-CM | POA: Diagnosis not present

## 2023-02-24 DIAGNOSIS — G8929 Other chronic pain: Secondary | ICD-10-CM

## 2023-02-24 DIAGNOSIS — M5416 Radiculopathy, lumbar region: Secondary | ICD-10-CM | POA: Diagnosis not present

## 2023-02-24 NOTE — Progress Notes (Signed)
The patient is a 89 year old gentleman that we have seen before.  He has severe lumbar stenosis and last year had an epidural steroid injection at the left-sided L5-S1 by Dr. Alvester Morin.  He said that his to help really well and said recently he started to have some pain again with radicular symptoms going down his left leg.  A lot of his pain is also in the lower back but it sounds like it is facet joint related.  He is able to sleep comfortably and its only different times when he does get a severe pain in his back.  He still reports left-sided radicular symptoms on the lateral aspect of that left leg.  He does ambulate with a cane.  His wife is with him today.  He has had some issues recently with balance and coordination as it relates to his age and frailty.  We talked about the possibility of outpatient physical therapy.  He does use a cane when he is out and about.  On exam he still has good strength in his bilateral lower extremities and there is some subjective numbness on the lateral aspect of his left leg.  There is some numbness in both feet.  He does have pain in the lower lumbar spine that is also facet joint mediated palpating his back and seeing him with flexion and extension.  His previous MRI last year showed severe stenosis at the lower lumbar spine especially at L5-S1 and he had that advanced facet arthritis at L5-S1.  He would like to have another intervention by Dr. Alvester Morin and I think this is reasonable as well for an epidural steroid injection to the left-sided L5-S1 but also considering at some point facet joint injections at L5-S1.  I would also like to send him to outpatient physical therapy for generalized strengthening and conditioning since he has had some balance issues.  They can work on core strengthening as well.  From my standpoint I will see her back in 6 weeks to see how he is doing overall and hopefully he will have had his intervention and therapy as well.

## 2023-03-06 ENCOUNTER — Ambulatory Visit: Payer: Medicare Other | Admitting: Rehabilitative and Restorative Service Providers"

## 2023-03-17 ENCOUNTER — Encounter: Payer: No Typology Code available for payment source | Admitting: Physical Medicine and Rehabilitation

## 2023-04-09 ENCOUNTER — Ambulatory Visit: Payer: No Typology Code available for payment source | Admitting: Orthopaedic Surgery

## 2023-05-07 ENCOUNTER — Ambulatory Visit
Admission: EM | Admit: 2023-05-07 | Discharge: 2023-05-07 | Disposition: A | Discharge status: Home / Self Care | Payer: Medicare Other | Attending: Internal Medicine | Admitting: Internal Medicine

## 2023-05-07 ENCOUNTER — Ambulatory Visit: Payer: Medicare Other | Admitting: Podiatry

## 2023-05-07 DIAGNOSIS — Z711 Person with feared health complaint in whom no diagnosis is made: Secondary | ICD-10-CM

## 2023-05-07 NOTE — Discharge Instructions (Addendum)
Please follow-up with your PCP if you develop any new or concerning symptoms.  Your exam is reassuring today.

## 2023-05-07 NOTE — ED Triage Notes (Signed)
Pt presents with bilateral leg pain  x 3 months. Pt states he was diagnosed with neuropathy. States the lt lower leg hurts more than the rt.

## 2023-05-07 NOTE — ED Provider Notes (Signed)
[facility]    CSN: [id number] Arrival date & time: 05/07/23  1236      History   Chief Complaint Chief Complaint  Patient presents with   Leg Pain    HPI Christopher Shepherd is a 89 y.o. male with a past medical history of neuropathy, OSA, osteoarthritis, hyperlipidemia, chronic kidney disease who presents for evaluation of a bump on his left calf.  Patient reports he has had a bump on his right posterior calf for greater than 6 months and possibly greater than a year.  He has had it looked at before and was told it was nothing to worry about.  He states yesterday he was rubbing his left calf when he thought he felt a similar bump causing him concern.  He wants to make sure he does not have a blood clot.  Today he is unable to locate the the bump that he felt yesterday.  He denies any swelling of his calf, redness or warmth.  No chest pain or shortness of breath.  No history of DVT or PE in the past.  He does wear compression stockings.  No other concerns at this time.   Leg Pain   Past Medical History:  Diagnosis Date   Allergic rhinitis    Anemia    Arthritis    osteoarthritis   Asthma 1994   with bronchitis   Cough    Extremity edema    Hyperlipidemia    Hypertension    Obstructive sleep apnea    uses a C-Pap    Patient Active Problem List   Diagnosis Date Noted   Encounter for immunization 11/30/2021   Spondylosis without myelopathy or radiculopathy, lumbar region 11/30/2021   Encounter for fitting and adjustment of hearing aid 12/13/2020   Abnormal glucose level 09/06/2020   Artificial knee joint present 09/06/2020   Chronic kidney disease 09/06/2020   Edema 09/06/2020   Obesity 09/06/2020   Polyphagia 09/06/2020   Primary localized osteoarthrosis, lower leg 09/06/2020   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   Chronic asthmatic bronchitis with acute exacerbation (HCC) 07/16/2014   Pain in lower limb 11/24/2013   S/P total knee arthroplasty  10/23/2013   Hyperlipidemia 10/08/2013   Constipation 10/08/2013   Essential hypertension, benign 10/08/2013   Hiccups 10/08/2013   Acute blood loss anemia 10/06/2013   OA (osteoarthritis) of knee 10/04/2013   Onychomycosis 10/30/2012   Pain in joint, ankle and foot 10/30/2012   Allergic rhinitis due to pollen 09/05/2010   Vitamin D deficiency 03/03/2009   Obstructive sleep apnea 04/16/2007   Allergic-infective asthma 04/16/2007   COUGH 04/16/2007   History of colonic polyps 08/06/2005    Past Surgical History:  Procedure Laterality Date   COLONOSCOPY W/ POLYPECTOMY     TOTAL KNEE ARTHROPLASTY Right 10/04/2013   Procedure: RIGHT TOTAL KNEE ARTHROPLASTY;  Surgeon: Loanne Drilling, MD;  Location: [facility];  Service: Orthopedics;  Laterality: Right;       Home Medications    Prior to Admission medications   Medication Sig Start Date End Date Taking? Authorizing Provider  acetaminophen (TYLENOL) 650 MG CR tablet Take 650 mg by mouth every 8 (eight) hours as needed for pain.    [provider]  chlorhexidine (PERIDEX) 0.12 % solution chlorhexidine gluconate 0.12 % mouthwash    [provider]  Cholecalciferol 50 MCG (2000 UT) TABS Take 1 tablet by mouth daily. 09/06/21   [provider]  furosemide (LASIX) 20 MG tablet Take 1  tablet (20 mg total) by mouth daily. 12/09/22   O'NealRonnald Ramp, MD  gabapentin (NEURONTIN) 100 MG capsule TAKE TWO CAPSULES BY MOUTH AT BEDTIME FOR BACK PAIN 10/12/21   [provider]  hydrALAZINE (APRESOLINE) 25 MG tablet Take 50 mg by mouth 2 (two) times daily. 03/10/20   [provider]  lidocaine (LIDODERM) 5 % Place 1 patch onto the skin daily. Remove & Discard patch within 12 hours or as directed by MD 09/03/21   Rolan Bucco, MD  losartan (COZAAR) 100 MG tablet Take 100 mg by mouth every morning.    [provider]  Omega 3 1000 MG CAPS Take 1 capsule by mouth.     [provider]   rosuvastatin (CRESTOR) 20 MG tablet Take 10 mg by mouth every evening.     [provider]  vitamin B-12 (CYANOCOBALAMIN) 100 MCG tablet Take 100 mcg by mouth daily.    [provider]    Family History Family History  Problem Relation Age of Onset   Cancer Father        cause of death   Colon cancer Mother        cause of death    Social History Social History   Tobacco Use   Smoking status: Former    Current packs/day: 0.00    Average packs/day: 0.2 packs/day for 15.0 years (3.0 ttl pk-yrs)    Types: Cigarettes    Start date: 07/01/1953    Quit date: 07/01/1968    Years since quitting: 54.8   Smokeless tobacco: Former   Tobacco comments:    Quit 50 yrs ago as of 2014  Vaping Use   Vaping status: Never Used  Substance Use Topics   Alcohol use: No    Alcohol/week: 3.0 standard drinks of alcohol    Types: 3 Cans of beer per week   Drug use: No     Allergies   Fosinopril   Review of Systems Review of Systems  Skin:        Possible bump on left leg     Physical Exam Triage Vital Signs ED Triage Vitals  Encounter Vitals Group     BP 05/07/23 1326 133/60     Systolic BP Percentile --      Diastolic BP Percentile --      Pulse Rate 05/07/23 1326 (!) 57     Resp 05/07/23 1326 16     Temp 05/07/23 1326 98.1 F (36.7 C)     Temp Source 05/07/23 1326 Oral     SpO2 05/07/23 1326 96 %     Weight --      Height --      Head Circumference --      Peak Flow --      Pain Score 05/07/23 1325 6     Pain Loc --      Pain Education --      Exclude from Growth Chart --    No data found.  Updated Vital Signs BP 133/60 (BP Location: Right Arm)   Pulse (!) 57   Temp 98.1 F (36.7 C) (Oral)   Resp 16   SpO2 96%   Visual Acuity Right Eye Distance:   Left Eye Distance:   Bilateral Distance:    Right Eye Near:   Left Eye Near:    Bilateral Near:     Physical Exam Vitals and nursing note reviewed.  Constitutional:      General: He is not  in acute distress.    Appearance: Normal appearance. He is not ill-appearing.  HENT:     Head: Normocephalic and atraumatic.  Eyes:     Pupils: Pupils are equal, round, and reactive to light.  Cardiovascular:     Rate and Rhythm: Bradycardia present.     Comments: Mildly bradycardia at 57 Pulmonary:     Effort: Pulmonary effort is normal.  Skin:    General: Skin is warm and dry.          Comments: Lipoma noted to right posterior calf.  No redness erythema warmth or tenderness with palpation.  There is no swelling, redness, erythema, warmth, tenderness, bumps or lumps to the left posterior calf.  Left calf measures 33.5 cm, right calf measures 34 cm.  Negative Homans' sign.  Neurological:     General: No focal deficit present.     Mental Status: He is alert and oriented to person, place, and time.      UC Treatments / Results  Labs (all labs ordered are listed, but only abnormal results are displayed) Labs Reviewed - No data to display  Clinical feature Score     Active cancer (treatment ongoing or within the previous six months or palliative) 0  Paralysis, paresis, or recent plaster immobilization of the lower extremities 0  Recently bedridden for more than three days or major surgery, within four weeks                                                    0  Localized tenderness along the distribution of the deep venous system 0  Entire leg swollen 0  Calf swelling by more than 3 cm when compared to the asymptomatic leg (measured below tibial tuberosity) 0  Pitting edema (greater in the symptomatic leg) 0  Collateral superficial veins (nonvaricose) 0  Alternative diagnosis as likely or more likely than that of deep venous thrombosis 0                                                                                                                                                             Total Score 0     Interpretation    High probability  3 or greater  Moderate probability  1 or 2  Low probability 0 or less  Modification   This clinical model has been modified to take one other clinical feature into account: a previously documented deep vein thrombosis (DVT) is given the score of 1. Using this modified scoring system, DVT is either likely or unlikely, as follows:  DVT likely 2 or greater   DVT unlikely 1 or less  EKG   Radiology No results found.  Procedures Procedures (including critical care time)  Medications Ordered in UC Medications - No data to display  Initial Impression / Assessment and Plan / UC Course  I have reviewed the triage vital signs and the nursing notes.  Pertinent labs & imaging results that were available during my care of the patient were reviewed by me and considered in my medical decision making (see chart for details).     Reviewed concerns and exam with patient.  There is no sign of lipoma, cyst, rashes, swelling, erythema, warmth ,lumps or bumps on the left calf.  There is no indication of a DVT and his Wells score is 0.  Advised patient to monitor and if he does develop a bump he saw yesterday to seek reevaluation with his PCP or he should go to the ER.  Reassurance provided to patient.  Follow-up with PCP as needed.  ER precautions reviewed. Final Clinical Impressions(s) / UC Diagnoses   Final diagnoses:  Feared condition not demonstrated     Discharge Instructions      Please follow-up with your PCP if you develop any new or concerning symptoms.  Your exam is reassuring today.    ED Prescriptions   None    PDMP not reviewed this encounter.   Radford Pax, NP 05/07/23 1406

## 2023-06-09 NOTE — Progress Notes (Unsigned)
  Cardiology Office Note:  .   Date:  06/09/2023  ID:  Christopher Shepherd, DOB 12-03-1926, MRN [id number] PCP: Andi Devon, MD  [facility] Providers Cardiologist:  None { Click to update primary MD,subspecialty MD or APP then REFRESH:1}   History of Present Illness: .   Christopher Shepherd is a 89 y.o. male with history of HFpEF, HTN, HLD who presents for follow-up.      Problem List 1. Advanced age 57. Orthostatic hypotension/HFpEF -PYP negative  3. HTN 4. HLD 5. PACs 6. 1AVB 7. OSA    ROS: All other ROS reviewed and negative. Pertinent positives noted in the HPI.     Studies Reviewed: Marland Kitchen       Physical Exam:   VS:  There were no vitals taken for this visit.   Wt Readings from Last 3 Encounters:  02/21/23 161 lb 6 oz (73.2 kg)  01/23/23 161 lb 6.4 oz (73.2 kg)  12/09/22 162 lb 6.4 oz (73.7 kg)    GEN: Well nourished, well developed in no acute distress NECK: No JVD; No carotid bruits CARDIAC: ***RRR, no murmurs, rubs, gallops RESPIRATORY:  Clear to auscultation without rales, wheezing or rhonchi  ABDOMEN: Soft, non-tender, non-distended EXTREMITIES:  No edema; No deformity  ASSESSMENT AND PLAN: .   ***    {Are you ordering a CV Procedure (e.g. stress test, cath, DCCV, TEE, etc)?   Press F2        :[id number]}   Follow-up: No follow-ups on file.  Time Spent with Patient: I have spent a total of *** minutes caring for this patient today face to face, ordering and reviewing labs/tests, reviewing prior records/medical history, examining the patient, establishing an assessment and plan, communicating results/findings to the patient/family, and documenting in the medical record.   Signed, Lenna Gilford. Flora Lipps, MD, [facility]  [facility]  [address] [place], [place] [address] [phone]  8:26 PM

## 2023-06-10 ENCOUNTER — Encounter: Payer: Self-pay | Admitting: Cardiovascular Disease

## 2023-06-10 ENCOUNTER — Other Ambulatory Visit (HOSPITAL_COMMUNITY): Payer: Self-pay

## 2023-06-10 ENCOUNTER — Ambulatory Visit: Payer: Medicare Other | Attending: Cardiovascular Disease | Admitting: Cardiovascular Disease

## 2023-06-10 VITALS — BP 156/78 | HR 61 | Ht 66.0 in | Wt 162.2 lb

## 2023-06-10 DIAGNOSIS — I951 Orthostatic hypotension: Secondary | ICD-10-CM | POA: Diagnosis present

## 2023-06-10 DIAGNOSIS — I10 Essential (primary) hypertension: Secondary | ICD-10-CM | POA: Insufficient documentation

## 2023-06-10 DIAGNOSIS — I5032 Chronic diastolic (congestive) heart failure: Secondary | ICD-10-CM | POA: Diagnosis present

## 2023-06-10 MED ORDER — HYDROCHLOROTHIAZIDE 50 MG PO TABS
50.0000 mg | ORAL_TABLET | Freq: Every day | ORAL | 3 refills | Status: DC
  Filled 2023-06-10: qty 90, 90d supply, fill #0

## 2023-06-10 MED ORDER — FUROSEMIDE 40 MG PO TABS
40.0000 mg | ORAL_TABLET | Freq: Every day | ORAL | 3 refills | Status: DC
  Filled 2023-06-10: qty 180, 180d supply, fill #0

## 2023-06-10 MED ORDER — FUROSEMIDE 40 MG PO TABS: 40.0000 mg | ORAL_TABLET | Freq: Every day | ORAL | 3 refills | Status: DC

## 2023-06-10 MED ORDER — HYDROCHLOROTHIAZIDE 50 MG PO TABS: 50.0000 mg | ORAL_TABLET | Freq: Every day | ORAL | 3 refills | Status: DC

## 2023-06-10 NOTE — Patient Instructions (Addendum)
Medication Instructions:  - START HYDROCHLOROTHIAZIDE 50MG  DAILY - START LASIX 40MG  DAILY   *If you need a refill on your cardiac medications before your next appointment, please call your pharmacy*   Lab Work: BMET    If you have labs (blood work) drawn today and your tests are completely normal, you will receive your results only by: MyChart Message (if you have MyChart) OR A paper copy in the mail If you have any lab test that is abnormal or we need to change your treatment, we will call you to review the results.   Testing/Procedures: NONE    Follow-Up: At [facility], you and your health needs are our priority.  As part of our continuing mission to provide you with exceptional heart care, we have created designated Provider Care Teams.  These Care Teams include your primary Cardiologist (physician) and Advanced Practice Providers (APPs -  Physician Assistants and Nurse Practitioners) who all work together to provide you with the care you need, when you need it.  We recommend signing up for the patient portal called "MyChart".  Sign up information is provided on this After Visit Summary.  MyChart is used to connect with patients for Virtual Visits (Telemedicine).  Patients are able to view lab/test results, encounter notes, upcoming appointments, etc.  Non-urgent messages can be sent to your provider as well.   To learn more about what you can do with MyChart, go to [email or web address].    Your next appointment:   1 year(s)  The format for your next appointment:   In Person  Provider:   Lennie Odor, MD   Other Instructions

## 2023-06-11 LAB — BASIC METABOLIC PANEL
BUN/Creatinine Ratio: 12 (ref 10–24)
BUN: 17 mg/dL (ref 10–36)
CO2: 24 mmol/L (ref 20–29)
Calcium: 9 mg/dL (ref 8.6–10.2)
Chloride: 107 mmol/L — ABNORMAL HIGH (ref 96–106)
Creatinine, Ser: 1.43 mg/dL — ABNORMAL HIGH (ref 0.76–1.27)
Glucose: 74 mg/dL (ref 70–99)
Potassium: 4.4 mmol/L (ref 3.5–5.2)
Sodium: 145 mmol/L — ABNORMAL HIGH (ref 134–144)
eGFR: 45 mL/min/{1.73_m2} — ABNORMAL LOW (ref >59)

## 2023-06-19 ENCOUNTER — Encounter: Payer: Self-pay | Admitting: Podiatry

## 2023-06-19 ENCOUNTER — Ambulatory Visit (INDEPENDENT_AMBULATORY_CARE_PROVIDER_SITE_OTHER): Payer: Medicare Other | Admitting: Podiatry

## 2023-06-19 DIAGNOSIS — N183 Chronic kidney disease, stage 3 unspecified: Secondary | ICD-10-CM

## 2023-06-19 DIAGNOSIS — M79675 Pain in left toe(s): Secondary | ICD-10-CM

## 2023-06-19 DIAGNOSIS — B351 Tinea unguium: Secondary | ICD-10-CM

## 2023-06-19 DIAGNOSIS — M79674 Pain in right toe(s): Secondary | ICD-10-CM | POA: Diagnosis not present

## 2023-06-19 NOTE — Progress Notes (Addendum)
This patient returns to the office for evaluation and treatment of long thick painful nails .  This patient is unable to trim his own nails since the patient cannot reach his feet.  Patient says the nails are painful walking and wearing his shoes.  He returns for preventive foot care services.  General Appearance  Alert, conversant and in no acute stress.  Vascular  Dorsalis pedis and posterior tibial  pulses are weakly  palpable  bilaterally.  Capillary return is within normal limits  bilaterally. Temperature is within normal limits  bilaterally.  Neurologic  Senn-Weinstein monofilament wire test within normal limits  bilaterally. Muscle power within normal limits bilaterally.  Nails Thick disfigured discolored nails with subungual debris  from hallux to fifth toes bilaterally. No evidence of bacterial infection or drainage bilaterally.  Orthopedic  No limitations of motion  feet .  No crepitus or effusions noted.  No bony pathology or digital deformities noted.  HAV  B/L.  Skin  normotropic skin with no porokeratosis noted bilaterally.  No signs of infections or ulcers noted.     Onychomycosis  Pain in toes right foot  Pain in toes left foot  Debridement  of nails  1-5  B/L with a nail nipper.  Nails were then filed using a dremel tool with no incidents.   Cauterized third toe right foot subungually.  RTC 12 weeks   Helane Gunther DPM

## 2023-06-24 ENCOUNTER — Encounter (HOSPITAL_COMMUNITY): Payer: Self-pay

## 2023-06-24 ENCOUNTER — Emergency Department (HOSPITAL_COMMUNITY)
Admission: EM | Admit: 2023-06-24 | Discharge: 2023-06-24 | Disposition: A | Discharge status: Home / Self Care | Payer: Medicare Other | Attending: Emergency Medicine | Admitting: Emergency Medicine

## 2023-06-24 ENCOUNTER — Other Ambulatory Visit: Payer: Self-pay

## 2023-06-24 ENCOUNTER — Emergency Department (HOSPITAL_COMMUNITY): Payer: Medicare Other

## 2023-06-24 DIAGNOSIS — Z79899 Other long term (current) drug therapy: Secondary | ICD-10-CM | POA: Insufficient documentation

## 2023-06-24 DIAGNOSIS — I509 Heart failure, unspecified: Secondary | ICD-10-CM | POA: Diagnosis not present

## 2023-06-24 DIAGNOSIS — R42 Dizziness and giddiness: Secondary | ICD-10-CM | POA: Diagnosis present

## 2023-06-24 DIAGNOSIS — I110 Hypertensive heart disease with heart failure: Secondary | ICD-10-CM | POA: Insufficient documentation

## 2023-06-24 DIAGNOSIS — G629 Polyneuropathy, unspecified: Secondary | ICD-10-CM | POA: Insufficient documentation

## 2023-06-24 LAB — CBC WITH DIFFERENTIAL/PLATELET
Abs Immature Granulocytes: 0.01 10*3/uL (ref 0.00–0.07)
Basophils Absolute: 0 10*3/uL (ref 0.0–0.1)
Basophils Relative: 0 %
Eosinophils Absolute: 0.1 10*3/uL (ref 0.0–0.5)
Eosinophils Relative: 1 %
HCT: 35.1 % — ABNORMAL LOW (ref 39.0–52.0)
Hemoglobin: 10.9 g/dL — ABNORMAL LOW (ref 13.0–17.0)
Immature Granulocytes: 0 %
Lymphocytes Relative: 23 %
Lymphs Abs: 1.1 10*3/uL (ref 0.7–4.0)
MCH: 28.3 pg (ref 26.0–34.0)
MCHC: 31.1 g/dL (ref 30.0–36.0)
MCV: 91.2 fL (ref 80.0–100.0)
Monocytes Absolute: 0.6 10*3/uL (ref 0.1–1.0)
Monocytes Relative: 11 %
Neutro Abs: 3.2 10*3/uL (ref 1.7–7.7)
Neutrophils Relative %: 65 %
Platelets: 156 10*3/uL (ref 150–400)
RBC: 3.85 MIL/uL — ABNORMAL LOW (ref 4.22–5.81)
RDW: 13.3 % (ref 11.5–15.5)
WBC: 5 10*3/uL (ref 4.0–10.5)
nRBC: 0 % (ref 0.0–0.2)

## 2023-06-24 LAB — COMPREHENSIVE METABOLIC PANEL
ALT: 16 U/L (ref 0–44)
AST: 19 U/L (ref 15–41)
Albumin: 3.8 g/dL (ref 3.5–5.0)
Alkaline Phosphatase: 47 U/L (ref 38–126)
Anion gap: 8 (ref 5–15)
BUN: 31 mg/dL — ABNORMAL HIGH (ref 8–23)
CO2: 29 mmol/L (ref 22–32)
Calcium: 9.3 mg/dL (ref 8.9–10.3)
Chloride: 106 mmol/L (ref 98–111)
Creatinine, Ser: 2.1 mg/dL — ABNORMAL HIGH (ref 0.61–1.24)
GFR, Estimated: 29 mL/min — ABNORMAL LOW (ref >60)
Glucose, Bld: 88 mg/dL (ref 70–99)
Potassium: 3.9 mmol/L (ref 3.5–5.1)
Sodium: 143 mmol/L (ref 135–145)
Total Bilirubin: 1 mg/dL (ref <1.2)
Total Protein: 7.1 g/dL (ref 6.5–8.1)

## 2023-06-24 LAB — BRAIN NATRIURETIC PEPTIDE: B Natriuretic Peptide: 151.5 pg/mL — ABNORMAL HIGH (ref 0.0–100.0)

## 2023-06-24 LAB — TROPONIN I (HIGH SENSITIVITY): Troponin I (High Sensitivity): 30 ng/L — ABNORMAL HIGH (ref <18)

## 2023-06-24 NOTE — ED Triage Notes (Addendum)
Pt present to ED from home with c/o dizziness. States feeling his "balance is off." Pt denies chest pain, shortness of breath at this time. Pt A&Ox3 at this time. Per EMS, pt states recent lasix increase. States to taking laxative last night. Pt received NS per EMS.

## 2023-06-24 NOTE — Discharge Instructions (Signed)
STOP the hydrochlorothiazide 50mg . Per Dr. Darnelle Bos note, he had recommended increasing your hydralazine 25mg  twice daily to 50mg  twice daily and did not write a note about starting hydrochlorothiazide.  If your blood pressures had been running ok at home on the 25mg  hydralazine twice a day, I don't think you need to increase the dose and can monitor your blood pressures. If you find your blood pressures are consistently very high (greater than 180), I would recommend increasing the hydralazine to 50mg  or two tablets twice a day (the dose recommended at recent visit with Dr. Scharlene Gloss.)  If you develop stroke like symptoms such as new numbness or weakness on one side or the other, trouble talking facial droop, please call 911 and return to the ED.  Follow up with your doctor and Cardiology for recheck of your labs in the next few weeks.  Have a Altamese Cabal Christmas!

## 2023-06-24 NOTE — ED Provider Notes (Signed)
[place] EMERGENCY DEPARTMENT AT [facility] Provider Note   CSN: [id number] Arrival date & time: 06/24/23  1503     History {Add pertinent medical, surgical, social history, OB history to HPI:1} Chief Complaint  Patient presents with   Dizziness    Christopher Shepherd is a 89 y.o. male.  HPI     89 year old male with a history of congestive heart failure with preserved ejection fraction, hypertension, hyperlipidemia, neuropathy   He recently saw his cardiologist on December 10 and per note had an increase in hydralazine from 25 mg twice daily to 50 mg twice daily, is also taking losartan 100 mg.  Plan to continue Lasix 40 mg daily for his heart failure.  Patient went to pharmacy and found he was started on hydrochorothiazide 50mg  which was a new medication for patient---note says hydralazine increase but rx is for hydrochlorthiazide (per records was on this in the past but it does not appear he is on it since then).  Blood pressure as low as 116 earlier today after taking medications.  Reports intermittent dizziness over the last year, however it has been worse over the last few days.  Describes the dizziness as a sensation of off-balance when he goes from sitting to standing.  Reports that this is combined also with his neuropathy and making it difficult for him to tell where his legs are in space.  Today his godson was at the door, and he stood up fairly quickly to go get the key to open it and felt more significantly lightheaded and had to grab onto a chair to keep from falling.  He has not had any chest pain, shortness of breath, nausea, vomiting, urinary symptoms.  Has chronic neuropathy in his bilateral lower extremities, but denies numbness, weakness, change in vision, difficulty talking, or difficulty walking after the initial minute after standing.  He was able to drive himself to the grocery store yesterday.    Home Medications Prior to Admission medications    Medication Sig Start Date End Date Taking? Authorizing Provider  acetaminophen (TYLENOL) 650 MG CR tablet Take 650 mg by mouth every 8 (eight) hours as needed for pain.    [provider]  chlorhexidine (PERIDEX) 0.12 % solution chlorhexidine gluconate 0.12 % mouthwash    [provider]  Cholecalciferol 50 MCG (2000 UT) TABS Take 1 tablet by mouth daily. 09/06/21   [provider]  cyanocobalamin (VITAMIN B12) 500 MCG tablet Take 2 tablets by mouth daily. 04/20/23   [provider]  furosemide (LASIX) 40 MG tablet Take 1 tablet (40 mg total) by mouth daily. 06/10/23 09/08/23  O'NealRonnald Ramp, MD  gabapentin (NEURONTIN) 100 MG capsule TAKE TWO CAPSULES BY MOUTH AT BEDTIME FOR BACK PAIN 10/12/21   [provider]  hydrALAZINE (APRESOLINE) 25 MG tablet Take 50 mg by mouth 2 (two) times daily. 03/10/20   [provider]  hydrochlorothiazide (HYDRODIURIL) 50 MG tablet Take 1 tablet (50 mg total) by mouth daily. 06/10/23   O'NealRonnald Ramp, MD  lidocaine (LIDODERM) 5 % Place 1 patch onto the skin daily. Remove & Discard patch within 12 hours or as directed by MD 09/03/21   Rolan Bucco, MD  losartan (COZAAR) 100 MG tablet Take 100 mg by mouth every morning.    [provider]  Omega 3 1000 MG CAPS Take 1 capsule by mouth.     [provider]  rosuvastatin (CRESTOR) 40 MG tablet Take 40 mg by mouth daily.  TAKE ONE-HALF TABLET BY MOUTH AT BEDTIME FOR CHOLESTEROL 03/13/23   [provider]      Allergies    Fosinopril    Review of Systems   Review of Systems  Physical Exam Updated Vital Signs BP (!) 124/54 (BP Location: Right Arm)   Pulse 74   Temp 98.8 F (37.1 C) (Oral)   Resp 18   Ht 5\' 6"  (1.676 m)   Wt 73.5 kg   SpO2 100%   BMI 26.15 kg/m  Physical Exam  ED Results / Procedures / Treatments   Labs (all labs ordered are listed, but only abnormal results are displayed) Labs Reviewed  CBC WITH  DIFFERENTIAL/PLATELET  COMPREHENSIVE METABOLIC PANEL  URINALYSIS, ROUTINE W REFLEX MICROSCOPIC  BRAIN NATRIURETIC PEPTIDE  TROPONIN I (HIGH SENSITIVITY)    EKG EKG Interpretation Date/Time:  Tuesday June 24 2023 15:27:09 EST Ventricular Rate:  65 PR Interval:  212 QRS Duration:  99 QT Interval:  450 QTC Calculation: 468 R Axis:   30  Text Interpretation: Sinus rhythm Atrial premature complexes Borderline prolonged PR interval Confirmed by Virgina Norfolk [phone]) on 06/24/2023 3:31:05 PM  Radiology No results found.  Procedures Procedures  {Document cardiac monitor, telemetry assessment procedure when appropriate:1}  Medications Ordered in ED Medications - No data to display  ED Course/ Medical Decision Making/ A&P   {   Click here for ABCD2, HEART and other calculatorsREFRESH Note before signing :1}                              Medical Decision Making  ***   Differential diagnosis for dizziness includes etiologies of lightheadedness such as arrhythmia, anemia, electrolyte abnormalities, bleed, dehydration, infection, ACS, PE, medication changes as well as dizziness related to inner ear pathology or central pathology such as CVA, ICH.  EKG completed and personally about interpreted by me shows a normal sinus rhythm without acute ST changes, no indication of endocarditis.  Labs completed and personally about interpreted by me show hemoglobin of 10.9, similar to prior values of 10 and 11, no leukocytosis, BNP less than previous 151 from 311 in 2023, creatinine mildly elevated from prior at 2.1 from 1.43, and BUN elevated to 31.  Troponins have mild stable elevation in comparison to prior with values in the past 20-36 and troponin of 30 today and have low suspicion for ACS.  Chest x-ray shows no evidence of pneumonia, pneumothorax or other abnormalities.  It does show left lung base atelectasis.  No sign of congestive heart failure.   {Document critical care time when  appropriate:1} {Document review of labs and clinical decision tools ie heart score, Chads2Vasc2 etc:1}  {Document your independent review of radiology images, and any outside records:1} {Document your discussion with family members, caretakers, and with consultants:1} {Document social determinants of health affecting pt's care:1} {Document your decision making why or why not admission, treatments were needed:1} Final Clinical Impression(s) / ED Diagnoses Final diagnoses:  None    Rx / DC Orders ED Discharge Orders     None

## 2023-06-26 ENCOUNTER — Telehealth: Payer: Self-pay

## 2023-06-26 ENCOUNTER — Telehealth: Payer: Self-pay | Admitting: Cardiovascular Disease

## 2023-06-26 NOTE — Telephone Encounter (Signed)
Called patient and confirmed that he has stopped taking his hydrochlorothiazide and increased his hydralazine to 50 mg based on the recommendations from his recent ER visit. Sent note to Dr. Flora Lipps confirming dose of med.

## 2023-06-26 NOTE — Telephone Encounter (Signed)
 Called patient with no answer. Left message to return call.

## 2023-06-26 NOTE — Telephone Encounter (Signed)
Called and spoke to patient. Verified name and DOB. Patient stated he has been having some dizziness since his increase in the Hydralazine on 12/10. He reports his balance is off, he is unsteady and had a fall on 12/24 requiring him to go to the ED for evaluation. Patient is now using a cane. He said he stopped medication today and took his last dose yesterday. Please advise. His BP readings:   12/23  138/61 HR 66 12/24  173/79  HR 65 IN THE ED 12/25   124/71  HR 61 12/26  150/78  HR 75

## 2023-06-26 NOTE — Telephone Encounter (Signed)
Patient returned call and below message relayed. He verbalized understanding and agree. No questions at this time.   OK. Let's have him hold it for now. As long as his BP <150/90, I am ok with this.

## 2023-06-26 NOTE — Telephone Encounter (Signed)
Pt c/o medication issue:  1. Name of Medication:   hydrALAZINE (APRESOLINE) 25 MG tablet    2. How are you currently taking this medication (dosage and times per day)? Take 50 mg by mouth 2 (two) times daily.   3. Are you having a reaction (difficulty breathing--STAT)? No   4. What is your medication issue? Pt states that the above medication has been causing him to feel dizzy and light headed. Please advise

## 2023-09-25 ENCOUNTER — Ambulatory Visit: Payer: Medicare Other | Admitting: Podiatry

## 2023-10-27 IMAGING — CR DG LUMBAR SPINE COMPLETE 4+V
5 series · 5 of 5 positions shown · non-contrast
Comparison: Lumbar radiograph 1 month ago 08/08/2021

CLINICAL DATA: [AGE] with back pain. Recurrent low back pain.

EXAM:
LUMBAR SPINE - COMPLETE 4+ VIEW

[t lumbar spine ap]
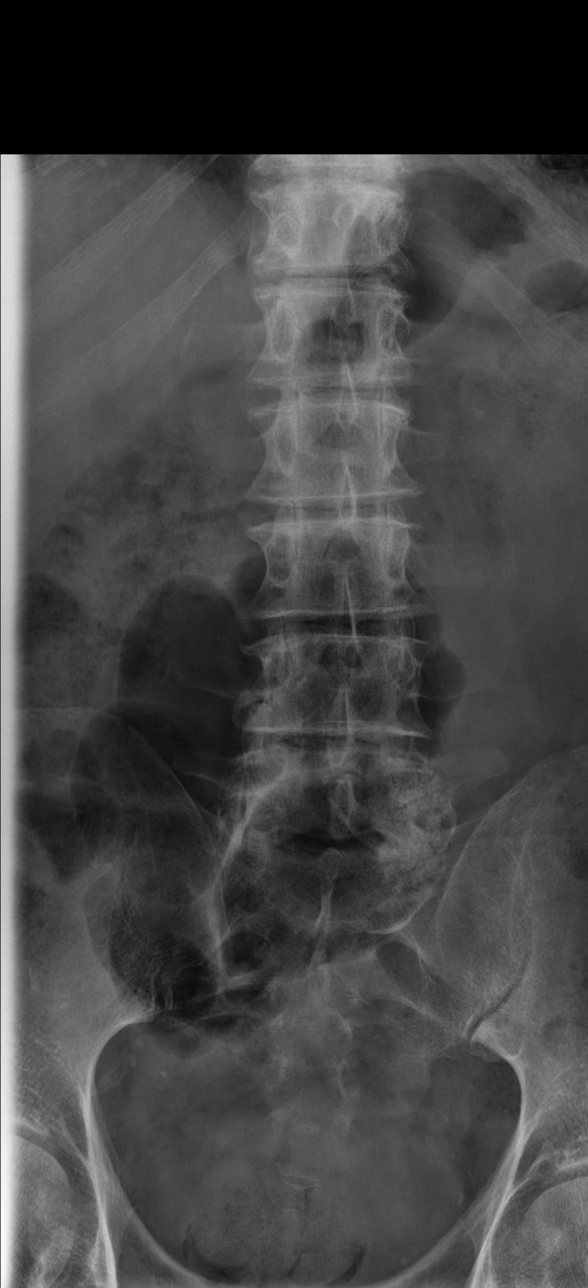

[t lumbar spine obl (1 of 2)]
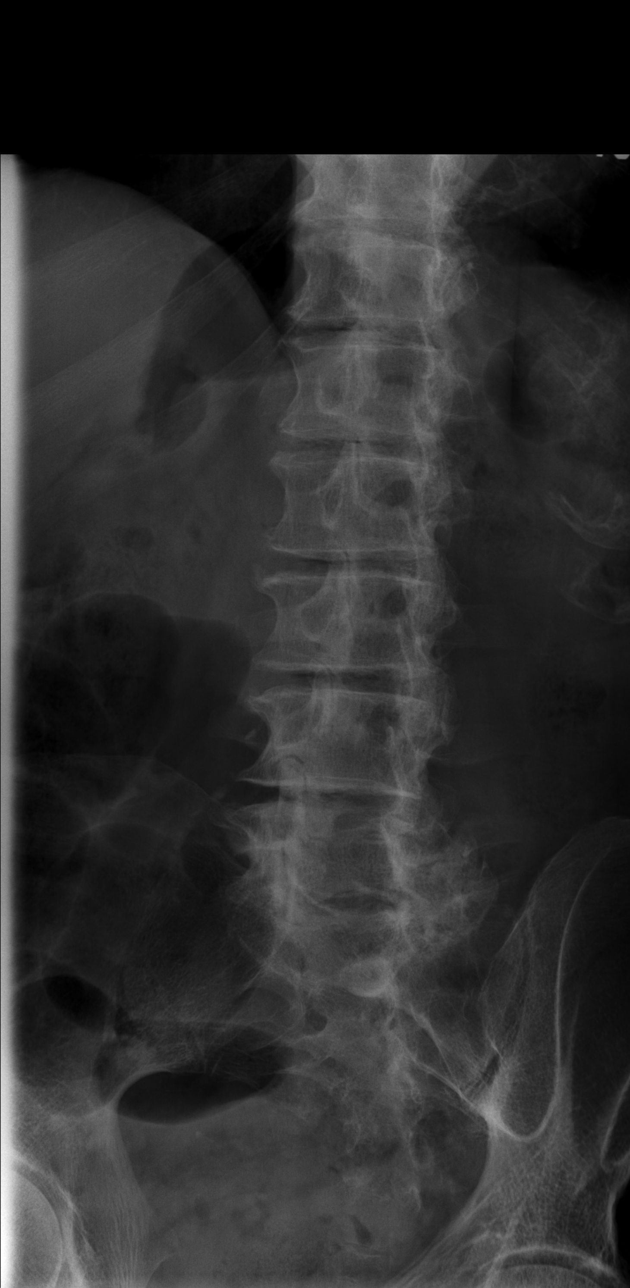

[t lumbar spine obl (2 of 2)]
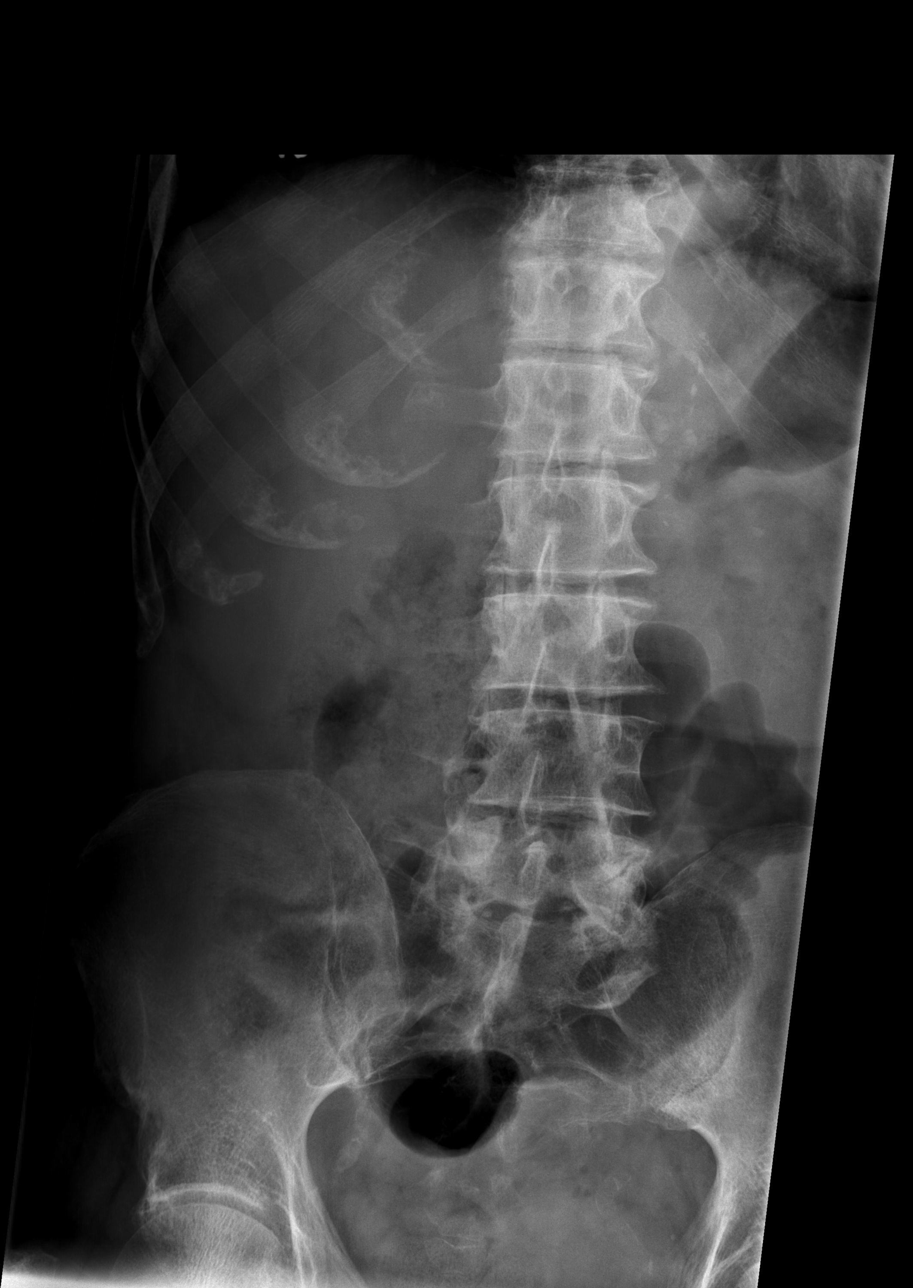

[t lumbar spine lat]
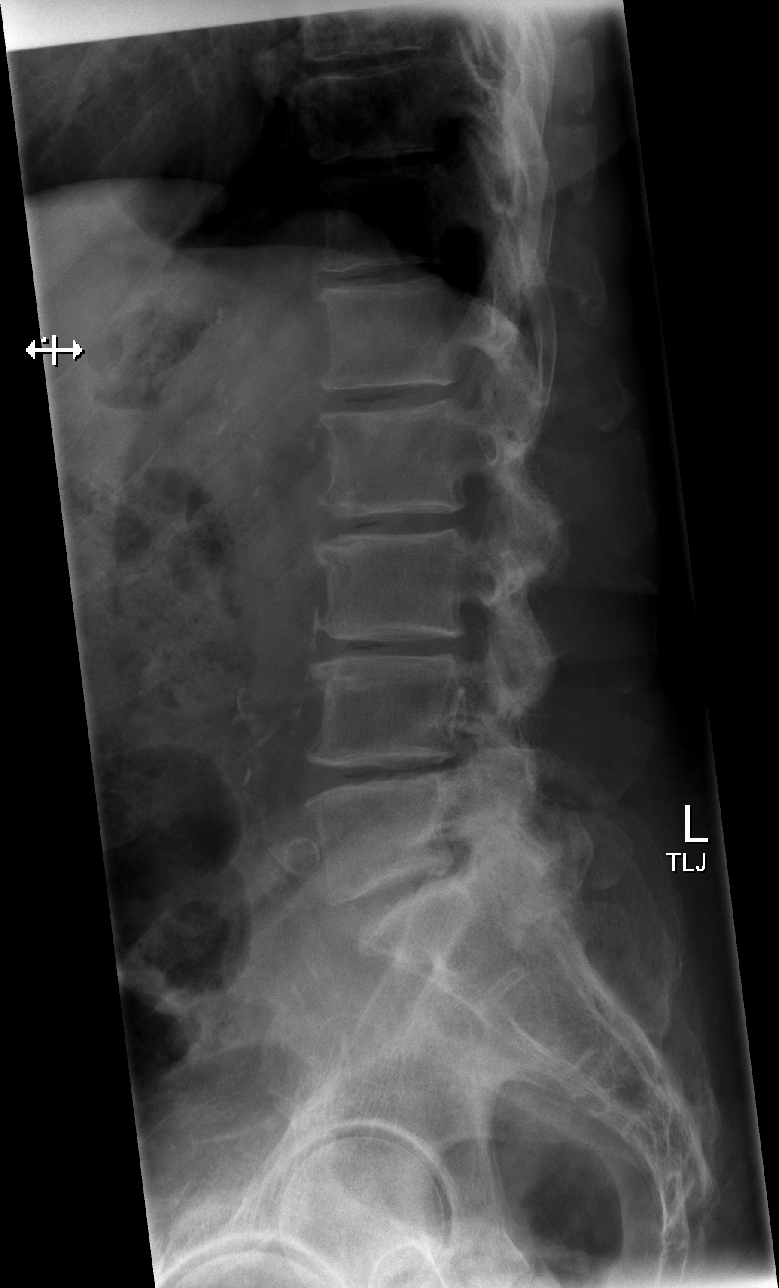

[t lumbar l-5 s-1 spot]
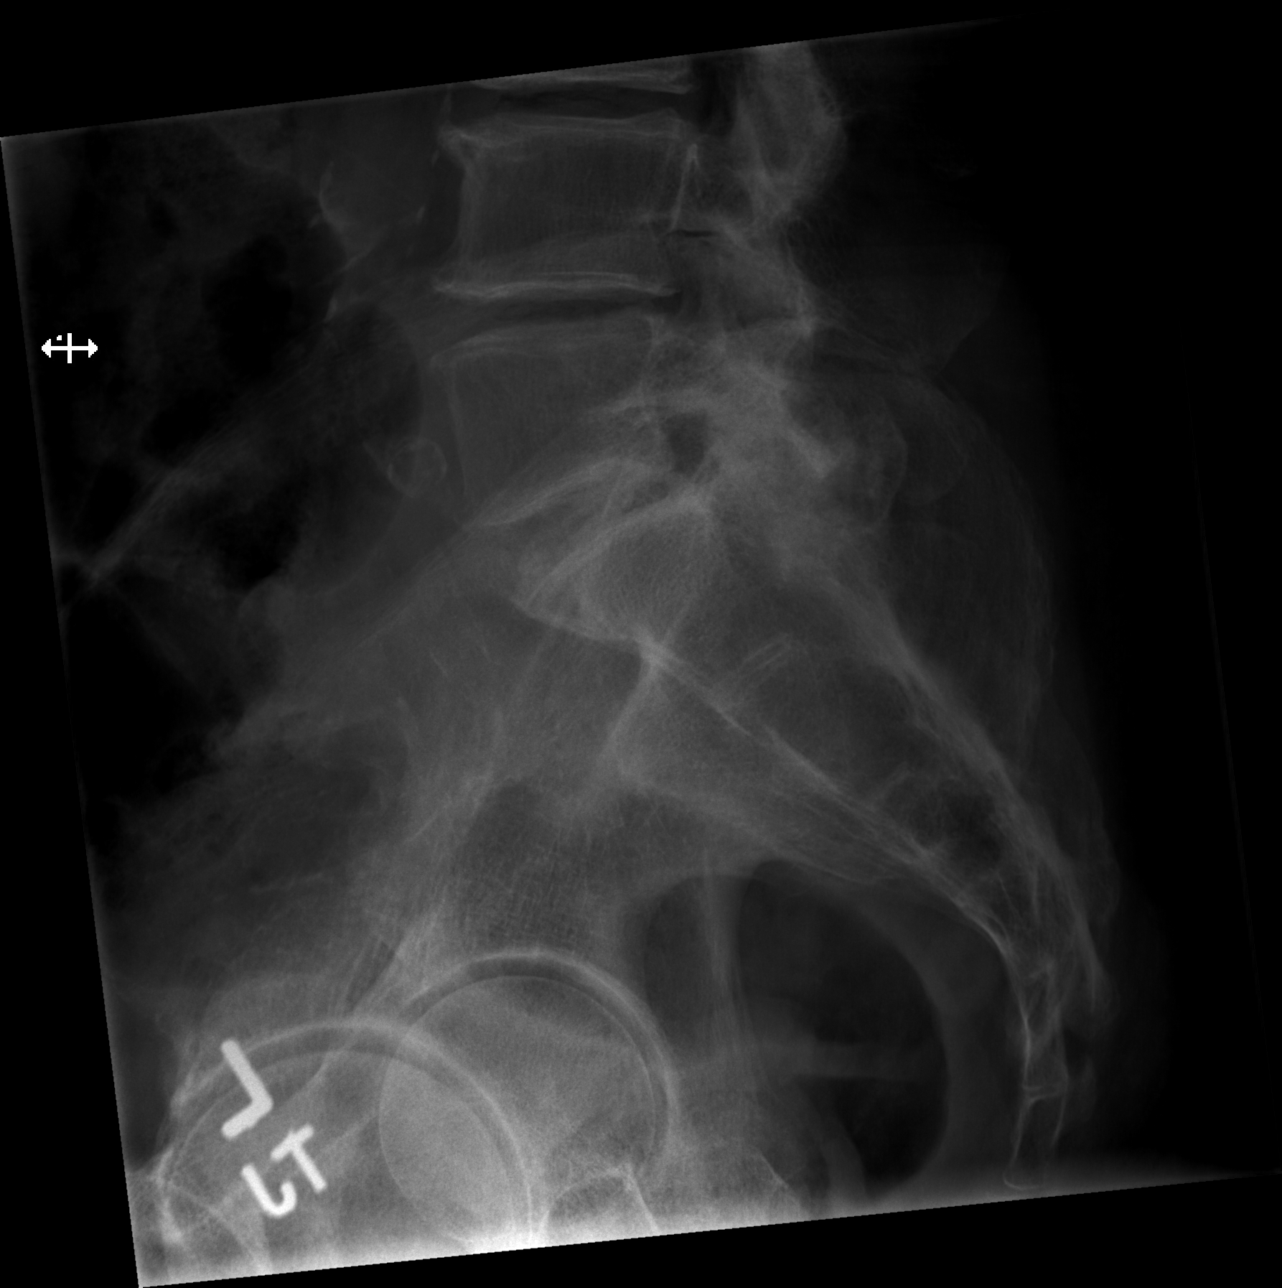

[5 of 5 positions shown; findings below may reference images not displayed]

FINDINGS: There are 5 lumbar type vertebra. Stable alignment with slight
straightening of normal lordosis. Trace grade 1 anterolisthesis of
L4 on L5. No fracture or compression deformity. Diffuse degenerative
disc disease with disc space narrowing, endplate spurring, and
vacuum phenomenon. Prominent lower lumbar facet hypertrophy. No
evidence of focal bone lesion or bony destruction. The sacroiliac
joints are congruent. Aortic atherosclerosis.
IMPRESSION: 1. Multilevel degenerative disc disease and facet hypertrophy,
unchanged from radiographs last month.
2. No acute radiographic findings.

## 2024-06-25 ENCOUNTER — Other Ambulatory Visit: Payer: Self-pay | Admitting: Cardiovascular Disease

## 2024-07-05 ENCOUNTER — Telehealth: Payer: Self-pay | Admitting: Cardiovascular Disease

## 2024-07-05 NOTE — Telephone Encounter (Signed)
 Pt c/o medication issue:  1. Name of Medication:   hydrochlorothiazide  (HYDRODIURIL ) 50 MG tablet   2. How are you currently taking this medication (dosage and times per day)?   Patient stated he had still been taking this medication  3. Are you having a reaction (difficulty breathing--STAT)?   4. What is your medication issue?   Patient wants a call back to confirm if he should still be taking this medication.  Patient wants a prescription called in to [facility] [address] [place] - [address]

## 2024-07-05 NOTE — Telephone Encounter (Signed)
 Spoke with pt, he reports he needs a refill on his hydrochlorothiazide . Advised patient that we do not have that medication on his list. He reports he was told to stop the furosemide  06/2023 and has been taking hydrochlorothiazide  since then. He does not know when he took the last lasix  and has been out of hydrochlorothiazide  for 2 weeks. He reports he gets his medications from the [facility]. He denies SOB but states he is weak and has no energy. He can have swelling by the end of the day but elevation will make them go down. He has a follow up appointment 07/12/24 and voiced understanding to bring all medications to that appointment. He wants to know what dr barbaraann wants him to take, aware we have not seen him in over 1 year and would be hard to judge. Will forward to dr barbaraann.

## 2024-07-06 NOTE — Progress Notes (Unsigned)
 " Cardiology Office Note:  .   Date:  07/07/2024  ID:  Montgomery Conine, DOB 1926-08-19, MRN [id number] PCP: Theo Iha, MD  [facility] Providers Cardiologist:  None   History of Present Illness: .    Chief Complaint  Patient presents with   Follow-up    Christopher Shepherd is a 89 y.o. male with below history who presents for follow-up.   History of Present Illness   Christopher Shepherd is a 89 year old male with hypertension and diastolic heart failure with preserved ejection fraction who presents for follow-up.  He generally feels well but experiences back pain and swelling. No shortness of breath or chest discomfort. He notes occasional balance problems and fatigue. He uses a cane and has a walker available for mobility.  He has a history of hypertension and is currently on losartan  100 mg daily and hydralazine  50 mg twice a day. There has been confusion regarding his diuretic use, as he has been taking hydrochlorothiazide , which was not intended. He is also on furosemide  (Lasix ) as needed for swelling, but it is not taken daily.  He has experienced falls. He has been taking metoprolol. He receives medications from both the [facility] and other sources, leading to some discrepancies in his medication list.  He has not had recent lab work to check kidney function, with the last labs done in December 2024.           Problem List 1. Advanced age 82. Orthostatic hypotension/HFpEF -PYP negative  -G1DD -> less concerning for amyloid  3. HTN 4. HLD 5. PACs 6. 1AVB 7. OSA    ROS: All other ROS reviewed and negative. Pertinent positives noted in the HPI.     Studies Reviewed: [facility]       TTE 09/25/2022  1. Left ventricular ejection fraction, by estimation, is 60 to 65%. The  left ventricle has normal function. The left ventricle has no regional  wall motion abnormalities. There is moderate asymmetric left ventricular  hypertrophy of the basal-septal  segment. Left ventricular  diastolic parameters are consistent with Grade I  diastolic dysfunction (impaired relaxation).   2. Right ventricular systolic function is normal. The right ventricular  size is normal. There is normal pulmonary artery systolic pressure. The  estimated right ventricular systolic pressure is 27.0 mmHg.   3. Left atrial size was mildly dilated.   4. The mitral valve is grossly normal. Trivial mitral valve  regurgitation.   5. The aortic valve is tricuspid. There is mild calcification of the  aortic valve. There is mild thickening of the aortic valve. Aortic valve  regurgitation is not visualized. Aortic valve sclerosis/calcification is  present, without any evidence of  aortic stenosis.   6. Aortic dilatation noted. There is borderline dilatation of the aortic  root, measuring 39 mm.   7. The inferior vena cava is normal in size with greater than 50%  respiratory variability, suggesting right atrial pressure of 3 mmHg.   Physical Exam:   VS:  BP (!) 118/54   Pulse 61   Ht 5' 6 (1.676 m)   Wt 149 lb 12.8 oz (67.9 kg)   SpO2 98%   BMI 24.18 kg/m    Wt Readings from Last 3 Encounters:  07/07/24 149 lb 12.8 oz (67.9 kg)  06/24/23 162 lb (73.5 kg)  06/10/23 162 lb 3.2 oz (73.6 kg)    GEN: Well nourished, well developed in no acute distress NECK: No JVD; No carotid bruits CARDIAC: RRR, no  murmurs, rubs, gallops RESPIRATORY:  Clear to auscultation without rales, wheezing or rhonchi  ABDOMEN: Soft, non-tender, non-distended EXTREMITIES:  No edema; No deformity  ASSESSMENT AND PLAN: .   Assessment and Plan    Chronic diastolic heart failure Well-managed with stable fluid levels and no significant edema. - Prescribed Lasix  20 mg as needed for edema.  Primary hypertension Controlled with losartan  and hydralazine . Discontinued metoprolol and hydrochlorothiazide  due to adverse effects. - Continue losartan  100 mg daily. - Continue hydralazine  50 mg twice daily. - Monitor blood  pressure regularly. - Stop metoprolol.  - BMP today.   Orthostatic hypotension - Ensure use of cane for stability. - Monitor for dizziness or balance issues.                Follow-up: Return in about 6 months (around 01/04/2025).  Signed, Darryle DASEN. Barbaraann, MD, [facility]  [facility]  [address] [place], [place] [address] [phone]  2:51 PM   "

## 2024-07-06 NOTE — Telephone Encounter (Signed)
 Spoke with Pt. Appt set for 07/07/24. Pt stated understanding

## 2024-07-07 ENCOUNTER — Encounter: Payer: Self-pay | Admitting: Cardiovascular Disease

## 2024-07-07 ENCOUNTER — Ambulatory Visit: Attending: Cardiovascular Disease | Admitting: Cardiovascular Disease

## 2024-07-07 VITALS — BP 118/54 | HR 61 | Ht 66.0 in | Wt 149.8 lb

## 2024-07-07 DIAGNOSIS — I951 Orthostatic hypotension: Secondary | ICD-10-CM | POA: Insufficient documentation

## 2024-07-07 DIAGNOSIS — I10 Essential (primary) hypertension: Secondary | ICD-10-CM | POA: Insufficient documentation

## 2024-07-07 DIAGNOSIS — Z79899 Other long term (current) drug therapy: Secondary | ICD-10-CM | POA: Insufficient documentation

## 2024-07-07 DIAGNOSIS — I5032 Chronic diastolic (congestive) heart failure: Secondary | ICD-10-CM | POA: Diagnosis not present

## 2024-07-07 MED ORDER — FUROSEMIDE 20 MG PO TABS: 20.0000 mg | ORAL_TABLET | ORAL | 3 refills | Status: AC | PRN

## 2024-07-07 NOTE — Patient Instructions (Signed)
 Medication Instructions:  STOP: Metoprolol Succinate  Change: Lasix  to 20 mg (1 tablet) as needed  *If you need a refill on your cardiac medications before your next appointment, please call your pharmacy*  Lab Work: TODAY: BMET If you have labs (blood work) drawn today and your tests are completely normal, you will receive your results only by: MyChart Message (if you have MyChart) OR A paper copy in the mail If you have any lab test that is abnormal or we need to change your treatment, we will call you to review the results.  Testing/Procedures: NONE  Follow-Up: At [facility], you and your health needs are our priority.  As part of our continuing mission to provide you with exceptional heart care, our providers are all part of one team.  This team includes your primary Cardiologist (physician) and Advanced Practice Providers or APPs (Physician Assistants and Nurse Practitioners) who all work together to provide you with the care you need, when you need it.  Your next appointment:   6 month(s)  Provider:   Dr. Barbaraann

## 2024-07-08 ENCOUNTER — Ambulatory Visit: Payer: Self-pay | Admitting: Cardiovascular Disease

## 2024-07-08 LAB — BASIC METABOLIC PANEL WITH GFR
BUN/Creatinine Ratio: 13 (ref 10–24)
BUN: 22 mg/dL (ref 10–36)
CO2: 24 mmol/L (ref 20–29)
Calcium: 9.2 mg/dL (ref 8.6–10.2)
Chloride: 105 mmol/L (ref 96–106)
Creatinine, Ser: 1.7 mg/dL — ABNORMAL HIGH (ref 0.76–1.27)
Glucose: 102 mg/dL — ABNORMAL HIGH (ref 70–99)
Potassium: 4 mmol/L (ref 3.5–5.2)
Sodium: 142 mmol/L (ref 134–144)
eGFR: 37 mL/min/1.73 — ABNORMAL LOW

## 2024-07-12 ENCOUNTER — Ambulatory Visit: Admitting: Nurse Practitioner
